# Patient Record
Sex: Female | Born: 1935
Health system: Southern US, Community
[De-identification: ages and names within clinical notes are randomized; demographics above are authoritative.]

## PROBLEM LIST (undated history)

## (undated) DIAGNOSIS — J45909 Unspecified asthma, uncomplicated: Secondary | ICD-10-CM

## (undated) DIAGNOSIS — I4891 Unspecified atrial fibrillation: Secondary | ICD-10-CM

## (undated) DIAGNOSIS — M1990 Unspecified osteoarthritis, unspecified site: Secondary | ICD-10-CM

## (undated) DIAGNOSIS — M19011 Primary osteoarthritis, right shoulder: Secondary | ICD-10-CM

## (undated) DIAGNOSIS — M19012 Primary osteoarthritis, left shoulder: Secondary | ICD-10-CM

## (undated) DIAGNOSIS — E039 Hypothyroidism, unspecified: Secondary | ICD-10-CM

## (undated) DIAGNOSIS — Z7901 Long term (current) use of anticoagulants: Secondary | ICD-10-CM

## (undated) DIAGNOSIS — I739 Peripheral vascular disease, unspecified: Secondary | ICD-10-CM

## (undated) DIAGNOSIS — J302 Other seasonal allergic rhinitis: Secondary | ICD-10-CM

## (undated) DIAGNOSIS — R51 Headache: Secondary | ICD-10-CM

## (undated) DIAGNOSIS — K5792 Diverticulitis of intestine, part unspecified, without perforation or abscess without bleeding: Secondary | ICD-10-CM

## (undated) DIAGNOSIS — R413 Other amnesia: Secondary | ICD-10-CM

## (undated) DIAGNOSIS — I499 Cardiac arrhythmia, unspecified: Secondary | ICD-10-CM

## (undated) DIAGNOSIS — R011 Cardiac murmur, unspecified: Secondary | ICD-10-CM

## (undated) DIAGNOSIS — F419 Anxiety disorder, unspecified: Secondary | ICD-10-CM

## (undated) DIAGNOSIS — I779 Disorder of arteries and arterioles, unspecified: Secondary | ICD-10-CM

## (undated) HISTORY — PX: BREAST EXCISIONAL BIOPSY: SUR124

## (undated) HISTORY — PX: COLONOSCOPY: SHX174

## (undated) HISTORY — PX: CAROTID ENDARTERECTOMY: SUR193

## (undated) HISTORY — PX: COLON SURGERY: SHX602

## (undated) HISTORY — DX: Diverticulitis of intestine, part unspecified, without perforation or abscess without bleeding: K57.92

## (undated) HISTORY — DX: Long term (current) use of anticoagulants: Z79.01

## (undated) HISTORY — DX: Peripheral vascular disease, unspecified: I73.9

## (undated) HISTORY — PX: TONSILLECTOMY: SUR1361

## (undated) HISTORY — DX: Disorder of arteries and arterioles, unspecified: I77.9

## (undated) HISTORY — PX: CATARACT EXTRACTION W/ INTRAOCULAR LENS  IMPLANT, BILATERAL: SHX1307

## (undated) HISTORY — PX: EYE SURGERY: SHX253

## (undated) HISTORY — DX: Unspecified atrial fibrillation: I48.91

---

## 1998-02-10 ENCOUNTER — Ambulatory Visit (HOSPITAL_COMMUNITY): Admission: RE | Admit: 1998-02-10 | Discharge: 1998-02-10 | Payer: Self-pay | Admitting: Obstetrics and Gynecology

## 1999-02-13 ENCOUNTER — Ambulatory Visit (HOSPITAL_COMMUNITY): Admission: RE | Admit: 1999-02-13 | Discharge: 1999-02-13 | Payer: Self-pay | Admitting: *Deleted

## 1999-02-13 ENCOUNTER — Other Ambulatory Visit: Admission: RE | Admit: 1999-02-13 | Discharge: 1999-02-13 | Payer: Self-pay | Admitting: *Deleted

## 2000-05-09 ENCOUNTER — Other Ambulatory Visit: Admission: RE | Admit: 2000-05-09 | Discharge: 2000-05-09 | Payer: Self-pay | Admitting: Internal Medicine

## 2002-07-28 ENCOUNTER — Other Ambulatory Visit: Admission: RE | Admit: 2002-07-28 | Discharge: 2002-07-28 | Payer: Self-pay | Admitting: Obstetrics and Gynecology

## 2002-09-03 ENCOUNTER — Emergency Department (HOSPITAL_COMMUNITY): Admission: EM | Admit: 2002-09-03 | Discharge: 2002-09-03 | Payer: Self-pay | Admitting: *Deleted

## 2002-09-29 ENCOUNTER — Ambulatory Visit (HOSPITAL_COMMUNITY): Admission: RE | Admit: 2002-09-29 | Discharge: 2002-09-29 | Payer: Self-pay | Admitting: Internal Medicine

## 2002-11-30 ENCOUNTER — Ambulatory Visit (HOSPITAL_COMMUNITY): Admission: RE | Admit: 2002-11-30 | Discharge: 2002-11-30 | Payer: Self-pay | Admitting: Gastroenterology

## 2003-03-12 HISTORY — PX: COLON SURGERY: SHX602

## 2003-04-20 ENCOUNTER — Ambulatory Visit (HOSPITAL_COMMUNITY): Admission: RE | Admit: 2003-04-20 | Discharge: 2003-04-20 | Payer: Self-pay | Admitting: Urology

## 2003-11-25 ENCOUNTER — Encounter (INDEPENDENT_AMBULATORY_CARE_PROVIDER_SITE_OTHER): Payer: Self-pay | Admitting: Specialist

## 2003-11-25 ENCOUNTER — Inpatient Hospital Stay (HOSPITAL_COMMUNITY): Admission: EM | Admit: 2003-11-25 | Discharge: 2003-12-03 | Payer: Self-pay | Admitting: Emergency Medicine

## 2004-03-07 ENCOUNTER — Inpatient Hospital Stay (HOSPITAL_COMMUNITY): Admission: RE | Admit: 2004-03-07 | Discharge: 2004-03-09 | Payer: Self-pay | Admitting: Vascular Surgery

## 2004-03-07 ENCOUNTER — Encounter (INDEPENDENT_AMBULATORY_CARE_PROVIDER_SITE_OTHER): Payer: Self-pay | Admitting: Specialist

## 2004-03-11 HISTORY — PX: GASTRIC RESTRICTION SURGERY: SHX653

## 2004-04-11 ENCOUNTER — Encounter: Admission: RE | Admit: 2004-04-11 | Discharge: 2004-04-11 | Payer: Self-pay | Admitting: Surgery

## 2004-05-17 ENCOUNTER — Inpatient Hospital Stay (HOSPITAL_COMMUNITY): Admission: RE | Admit: 2004-05-17 | Discharge: 2004-05-23 | Payer: Self-pay | Admitting: Surgery

## 2004-05-17 ENCOUNTER — Encounter (INDEPENDENT_AMBULATORY_CARE_PROVIDER_SITE_OTHER): Payer: Self-pay | Admitting: *Deleted

## 2004-08-14 ENCOUNTER — Other Ambulatory Visit: Admission: RE | Admit: 2004-08-14 | Discharge: 2004-08-14 | Payer: Self-pay | Admitting: Obstetrics and Gynecology

## 2004-10-11 ENCOUNTER — Encounter: Admission: RE | Admit: 2004-10-11 | Discharge: 2004-10-11 | Payer: Self-pay | Admitting: Internal Medicine

## 2005-11-15 ENCOUNTER — Encounter: Admission: RE | Admit: 2005-11-15 | Discharge: 2005-11-15 | Payer: Self-pay | Admitting: Internal Medicine

## 2006-08-01 ENCOUNTER — Encounter: Admission: RE | Admit: 2006-08-01 | Discharge: 2006-08-01 | Payer: Self-pay | Admitting: Internal Medicine

## 2006-11-19 ENCOUNTER — Encounter: Admission: RE | Admit: 2006-11-19 | Discharge: 2006-11-19 | Payer: Self-pay | Admitting: Internal Medicine

## 2008-01-05 ENCOUNTER — Encounter: Admission: RE | Admit: 2008-01-05 | Discharge: 2008-01-05 | Payer: Self-pay | Admitting: Internal Medicine

## 2008-12-11 ENCOUNTER — Emergency Department (HOSPITAL_COMMUNITY): Admission: EM | Admit: 2008-12-11 | Discharge: 2008-12-11 | Payer: Self-pay | Admitting: Emergency Medicine

## 2009-01-09 ENCOUNTER — Encounter: Admission: RE | Admit: 2009-01-09 | Discharge: 2009-01-09 | Payer: Self-pay | Admitting: Internal Medicine

## 2010-01-10 ENCOUNTER — Encounter: Admission: RE | Admit: 2010-01-10 | Discharge: 2010-01-10 | Payer: Self-pay | Admitting: Internal Medicine

## 2010-04-01 ENCOUNTER — Encounter: Payer: Self-pay | Admitting: Internal Medicine

## 2010-06-14 LAB — COMPREHENSIVE METABOLIC PANEL
ALT: 27 U/L (ref 0–35)
Albumin: 4.1 g/dL (ref 3.5–5.2)
Alkaline Phosphatase: 83 U/L (ref 39–117)
BUN: 13 mg/dL (ref 6–23)
CO2: 27 mEq/L (ref 19–32)
Calcium: 9.4 mg/dL (ref 8.4–10.5)
GFR calc non Af Amer: 52 mL/min — ABNORMAL LOW (ref >60)
Sodium: 138 mEq/L (ref 135–145)
Total Bilirubin: 1.1 mg/dL (ref 0.3–1.2)
Total Protein: 7.4 g/dL (ref 6.0–8.3)

## 2010-06-14 LAB — POCT CARDIAC MARKERS
CKMB, poc: 1.2 ng/mL (ref 1.0–8.0)
Myoglobin, poc: 99.2 ng/mL (ref 12–200)
Troponin i, poc: <0.05 ng/mL (ref 0.00–0.09)

## 2010-06-14 LAB — DIFFERENTIAL
Basophils Absolute: 0 10*3/uL (ref 0.0–0.1)
Basophils Relative: 0 % (ref 0–1)
Eosinophils Absolute: 0.1 10*3/uL (ref 0.0–0.7)
Lymphs Abs: 1.5 10*3/uL (ref 0.7–4.0)
Monocytes Relative: 12 % (ref 3–12)

## 2010-06-14 LAB — URINALYSIS, ROUTINE W REFLEX MICROSCOPIC
Bilirubin Urine: NEGATIVE
Leukocytes, UA: NEGATIVE
Nitrite: NEGATIVE

## 2010-06-14 LAB — TSH: TSH: 0.296 u[IU]/mL — ABNORMAL LOW (ref 0.350–4.500)

## 2010-06-14 LAB — CBC
HCT: 43.4 % (ref 36.0–46.0)
Hemoglobin: 14.8 g/dL (ref 12.0–15.0)
MCHC: 34.1 g/dL (ref 30.0–36.0)
Platelets: 167 10*3/uL (ref 150–400)
RDW: 13.2 % (ref 11.5–15.5)

## 2010-07-27 NOTE — Op Note (Signed)
[name], [name]                ACCOUNT NO.:  [id number]   MEDICAL RECORD NO.:  [id number]          PATIENT TYPE:  INP   LOCATION:  2550                         FACILITY:  [facility]   PHYSICIAN:  Sandria Bales. Ezzard Standing, M.D.  DATE OF BIRTH:  June 26, 1932   DATE OF PROCEDURE:  11/25/2003  DATE OF DISCHARGE:                                 OPERATIVE REPORT   PREOPERATIVE DIAGNOSIS:  Perforated sigmoid diverticulitis.   POSTOPERATIVE DIAGNOSIS:  Perforated sigmoid diverticulitis.   OPERATION PERFORMED:  Open sigmoid colectomy with end colostomy.   SURGEON:  Sandria Bales. Ezzard Standing, M.D.   ASSISTANT:  Abigail Miyamoto, M.D.   ANESTHESIA:  General endotracheal.   ESTIMATED BLOOD LOSS:  100 mL.   DRAINS:  None.   INDICATIONS FOR PROCEDURE:  [name] is a 77 year old white female who had  sudden onset of abdominal pain last evening, had a CT scan this morning  which showed an inflammatory area of the sigmoid colon with free air in the  peritoneal cavity.  She has presented with signs and symptoms of  peritonitis.  Discussion was carried out with her about proceeding to the  operating room for abdominal exploration and probable sigmoid colectomy.  The indications and potential complications were explained to the patient.  The potential complications include but not limited to bleeding, infection,  the need for colostomy and that this will probably be a reversible  colostomy.   DESCRIPTION OF PROCEDURE:  The patient was taken to the operating room and  given a general tracheal anesthetic.  She actually at home was on Avelox and  Biaxin so this made her not really a candidate for a [place] study.  She  was given Unasyn preoperatively.  The abdomen was prepped with Betadine  solution.  She had a Foley catheter in place, an orogastric tube in place  because of significant sinus trouble she has been having.  The abdomen was  prepped with Betadine solution and sterilely draped.  I made a midline  incision and went to the abdominal cavity.  Abdominal exploration revealed  the right and left lobes of the liver palpated normal.  What I could feel of  the gallbladder was normal.  The small bowel was otherwise, unremarkable  except for inflammatory exudate and pus emanating from the left lower  quadrant.  She had a perforation of the proximal one third of her sigmoid  colon.  I resected an about 17 to 18 cm length of sigmoid colon with the  perforation involved.  I divided the colon using a GIA stapler, the 75 load.  I marked the distal end of the two Prolene sutures.  I did identify the left  ureter which was well posterior to the dissection.  Because this was the  proximal sigmoid colon, I did have to mobilize a lot of the left colon to  actually get the colostomy in the left upper quadrant.  When I go back to  reverse her, I will need to take down her splenic flexure to get this down  to the pelvis.   Consideration will be done  for doing at least that part of the operation  laparoscopically.  The proximal end of the bowel again brought up through  the ostomy wound in the left upper quadrant, dividing the rectus abdominis  muscles. I went through the anterior and posterior fascia and developed the  colostomy using 3-0 Vicryl sutures.  I then irrigated the abdomen with 5  liters of saline, closed the fascia with two running #1 PDS sutures, closed  the skin with a skin gun.  I then irrigated thoroughly.  I know she is at  risk for wound infection.  Her colostomy then matured with interrupted 3-0  Vicryl sutures.  I sterilely dressed the wound.  Dr. Katrinka Blazing put in a central  line because she have some intravenous access trouble during the surgery.  The patient was then transferred to the recovery room in good condition.  The sponge and needle counts were correct at the end of this case.      Pervis Hocking   DHN/MEDQ  D:  11/25/2003  T:  11/28/2003  Job:  [id number]   cc:   Larina Earthly, M.D.  [address]  [place]  [place] [address]  Fax: [phone]   Tera Mater. Evlyn Kanner, M.D.  [address]  [place]  [place] [address]  Fax: [phone]   Llana Aliment. Malon Kindle., M.D.  [address]  [place]  [place] [address]  Fax: [phone]   Claudette Laws, M.D.  [address]  [place]  [place] [address]  Fax: [phone]   Jefry H. Pollyann Kennedy, MD  [phone] [place]  [place] [address]  Fax: [phone]

## 2010-07-27 NOTE — Consult Note (Signed)
Danielle [name], Danielle [name]                ACCOUNT NO.:  [id number]   MEDICAL RECORD NO.:  [id number]          PATIENT TYPE:  INP   LOCATION:  5742                         FACILITY:  [facility]   PHYSICIAN:  Jefry H. Pollyann Kennedy, M.D.   DATE OF BIRTH:  08-20-1932   DATE OF CONSULTATION:  11/29/2003  DATE OF DISCHARGE:                                   CONSULTATION   REASON FOR CONSULTATION:  Headache and possible sinus disease.   HISTORY OF PRESENT ILLNESS:  This is a 77 year old lady who had a several-  week history of right-sided hemifacial pain and pressure sensation.  She  denied any nasal symptoms.  She denied any hearing loss or any trouble with  chewing or opening her mouth.  She denies any dental pain or dental disease.  She is a patient of Dr. Elliston Callas for chronic allergic disease that has been  reasonably well controlled.  She underwent a CT of the sinuses in the past  couple of weeks that reportedly revealed a maxillary retention cyst.  She  was scheduled to see me in the office last week but then developed  diverticulitis and was admitted to the hospital for surgical intervention.  Since she has been in the hospital, her headaches have actually improved  dramatically.  She is currently on antibiotics and analgesics.   PAST MEDICAL/SURGICAL HISTORY:  Otherwise unremarkable.   PHYSICAL EXAMINATION:  GENERAL:  She is a pleasant, healthy-appearing,  elderly lady.  HEENT:  The oral cavity and pharynx are clear.  Nasal exam is unremarkable.  There are no signs of polyps or exudate.  The ears are normal to examination  without any evidence of external otitis, air fluid, or disease.  NECK:  There are no palpable neck masses, although there is an internal  jugular vein central line placement on the right.   IMPRESSION:  Right hemifacial pain, probably not of sinus origin.  I will  review the CT scans tomorrow.  The husband has been at home and is going to  bring them in tomorrow.  If any additional  recommendations need to be made,  I will make them at that time.       JHR/MEDQ  D:  11/29/2003  T:  11/30/2003  Job:  [id number]   cc:   Sandria Bales. Ezzard Standing, M.D.  [address]  [place]  [place] [address]  Fax: [phone]   Sidney Ace, M.D. [facility]

## 2010-07-27 NOTE — H&P (Signed)
NAME:  Danielle [name], Danielle [name]                          ACCOUNT NO.:  [id number]   MEDICAL RECORD NO.:  [id number]                   PATIENT TYPE:  INP   LOCATION:  2550                                 FACILITY:  [facility]   PHYSICIAN:  Sandria Bales. Ezzard Standing, M.D.               DATE OF BIRTH:  08-Jul-1932   DATE OF ADMISSION:  11/25/2003  DATE OF DISCHARGE:                                HISTORY & PHYSICAL   HISTORY OF PRESENT ILLNESS:  This is a 77 year old white female who has had  some trouble with constipation.  Actually, she was given a new laxative in  the last week or two by Dr. Felipa Eth.  She has had at least two colonoscopies  within the last five years, the most recent about a year ago by Dr. Vilinda Boehringer, who has no obvious colonic mass or polyp.  She apparently has had  at least one bout of diverticulitis going back many years, probably 8-10  years ago.  She has never been hospitalized for any other colonic problems.  She denies history of peptic ulcer disease, liver disease, gallbladder  disease, and she has had no prior abdominal surgery.  Last evening, she  developed abdominal pain which got progressively worse and localized to her  left lower quadrant suprapubic area.  This morning, she went to see Dr. Felipa Eth  and had a CT scan.  The CT scan, read by radiology, revealed inflammatory  mass of the sigmoid colon with free air consistent with perforated  diverticular disease.   ALLERGIES:  She denies any allergies.   CURRENT MEDICATIONS:  Avelox 400 mg daily, Singular 10 mg q.h.s., Tramadol  HCL 50 mg q.6h., Zyrtec 10 mg daily, Biaxin 1000 mg daily, prednisone  dosepak she has taken about 2/3 of, Nasacort, Advair, Mucinex, Synthroid,  she thinks 0.0125 mg, Fosamax.   REVIEW OF SYMPTOMS:  NEUROLOGIC:  No history of seizures or loss of  consciousness.  PULMONARY:  No history of pneumonia or tuberculosis.  She  has had a lot of sinus and headache problem.  She underwent a CT scan at  [facility]  dated November 21, 2003, read by Dagoberto Reef, which showed  soft tissue nodularity of the right maxillary sinus, most likely mucous  retention cyst.  She was scheduled to see Dr. Pollyann Kennedy this morning before this  sudden abdominal pain came on.  CARDIAC:  No heart disease or chest pain.  GASTROINTESTINAL:  See history of present illness.  UROLOGIC:  She has seen  Dr. Mickel Crow, has had some recurrent bladder spasm/pain, I think she is on  Tramadol by Dr. Etta Grandchild for this pain or discomfort.  GYNECOLOGIC:  She has  two daughters and has had no vaginal bleeding.   SOCIAL HISTORY:  She worked for the [employer] for a number of  years but is retired now.   PHYSICAL EXAMINATION:  VITAL SIGNS:  Temperature 99.2, blood pressure 132/84, pulse 64,  respirations 18.  GENERAL:  She is a well nourished white female, alert and cooperative on  exam, but clearly in discomfort of her abdomen.  HEENT:  Unremarkable.  NECK:  Supple, no masses, no thyromegaly.  LUNGS:  Clear to auscultation.  BREASTS:  Symmetric.  She has had a prior breast biopsy by Dr. Terri Piedra many,  many years ago, no recent breast trouble.  ABDOMEN:  She has some decreased but barely present bowel sounds, she is  tender with guarding in her suprapubic and left lower quadrant area.  She  has no abdominal scars, no abdominal masses.  RECTAL:  I did not do a rectal exam on her.  EXTREMITIES:  She has good strength in the upper and lower extremities.  NEUROLOGICAL:  She is grossly intact to motor and sensory function.   LABORATORY DATA:  White blood count 15,200, hemoglobin 15, hematocrit 44,  platelet count 194,000.  Sodium 135, potassium 3.6, chloride 104, CO2 21,  glucose 123, BUN 19.  Her urinalysis was negative.  Protime 13.2.   IMPRESSION:  1.  Perforated sigmoid diverticulitis.  The patient clearly has a moderate      amount of free air with local inflammatory mass, has worsening symptoms.      I  do not think she  will be a candidate for IV antibiotics and      observation.  I talked to her and her husband about going ahead with      surgery, that most likely she would need a colostomy, this would be      temporary, 3-6 months.  I also talked about the possibility of      malignancy and the possibility of wound problems and infection.  I think      she understands all this pretty well and now will be taken to the      operating room for abdominal exploration.  2.  Sinus headaches and apparent mucous retention cyst, particularly of her      right maxillary sinus, with plans to see Dr. Pollyann Kennedy.  If she has problems      with this, we can contact him in the hospital, otherwise, we will make      arrangements post discharge.  3.  Hypothyroid on medicine.  4.  Bladder complaints followed by Dr. Javier Docker. Ezzard Standing, M.D.    DHN/MEDQ  D:  11/25/2003  T:  11/25/2003  Job:  [id number]   cc:   Larina Earthly, M.D.  [address]  [place]  [place] [address]  Fax: [phone]   Tera Mater. Evlyn Kanner, M.D.  [address]  [place]  [place] [address]  Fax: [phone]   Llana Aliment. Malon Kindle., M.D.  [address]  [place]  [place] [address]  Fax: [phone]   Jeannett Senior. Pollyann Kennedy, M.D.  [address] [place]  [place] [address]  Fax: [phone]   Claudette Laws, M.D.  [address]  [place]  [place] [address]  Fax: [phone]

## 2010-07-27 NOTE — Op Note (Signed)
Danielle [name], [place] NO.:  [id number]   MEDICAL RECORD NO.:  [id number]          PATIENT TYPE:  OIB   LOCATION:  2899                         FACILITY:  [facility]   PHYSICIAN:  Janetta Hora. Fields, MD  DATE OF BIRTH:  Jun 02, 1932   DATE OF PROCEDURE:  03/07/2004  DATE OF DISCHARGE:                                 OPERATIVE REPORT   PREOPERATIVE DIAGNOSIS:  Right symptomatic internal carotid stenosis.   POSTOPERATIVE DIAGNOSIS:  Right symptomatic internal carotid artery  stenosis.   PROCEDURE:  Right carotid endarterectomy.   SURGEON:  Janetta Hora. Fields, M.D.   ASSISTANT:  Rowe Clack, P.A.-C.   ANESTHESIA:  General.   INDICATIONS:  The patient is a 77 year old female with a severe, right  carotid artery stenosis by duplex ultrasound. She has had a recent TIA  manifesting with left arm weakness.   OPERATIVE FINDINGS:  1.  An 80% right internal carotid artery stenosis with disease primarily      limited to the internal carotid artery and minimal common and external      carotid artery disease.  2.  A 10 French shunt.  3.  Dacron patch.   DESCRIPTION OF PROCEDURE:  After obtaining informed consent, the patient was  taken to the operating room, patient placed in the supine position on the  operating table.  After induction of general anesthesia and endotracheal  intubation, a Foley catheter was placed.  Next, the patient's entire right  neck and chest were prepped and draped in the usual sterile fashion.  Next,  an oblique incision was made along the anterior border of the right  sternocleidomastoid muscle.  This incision was carried down to the level of  the jugular vein. The common facial vein was dissected free  circumferentially and ligated and divided between silk ties.  The jugular  vein was then retracted laterally.  The common carotid artery was dissected  free circumferentially.   The patient had a fairly high bifurcation.  Dissection proceded up  onto the  internal carotid artery and extensive mobilization of the hypoglossal nerve  was required to mobilize the internal carotid artery sufficiently to get to  a disease free area.  This required division of the occipital branches to  the sternocleidomastoid muscle and some retraction on the hypoglossal nerve.  The hypoglossal nerve was protected and the ansa cervicalis was divided.  The internal carotid artery was dissected free circumferentially at a  suitable area past the disease process. The external carotid, superior  thyroid arteries were also dissected free circumferentially.  External  superior thyroid arteries were controlled with a vessel loop.  Common  carotid artery was controlled with an umbilical tape. Vagus nerve was  identified and protected from harms' way.   Next, the patient was given 7000 units of intravenous heparin.  Distal  internal carotid artery was occluded with a fine bulldog clamp. External  carotid and superior thyroid arteries were controlled with vessel loops.  Common carotid artery was controlled with a peripheral DeBakey clamp.  Longitudinal arteriotomy was made just below the carotid bifurcation.  This  was extended with Potts scissors up through the level of severe stenosis in  the internal carotid artery which was approximately 80%.  There was friable  material and plaque in this area.   A 10 French shunt was then brought up in the operative field, and threaded  into the internal carotid artery and allowed to back-bleed. This was then  threaded down into the common carotid artery and secured with an umbilical  tape.  The shunt was inspected and opened to flow, to restore flow to the  brain in approximately 6 minutes.   Next, endarterectomy was begun in a suitable plane near the carotid  bifurcation.  There was minimal disease in the common and external carotid  arteries, and the plaque was quite thin and friable in this area.  All this  debris  was completely removed.  Endartectomy was extended up into the  internal carotid artery and a tight stenosis with friable plaque was removed  completely.  All loose debris was completely removed from the  endarterectomies surface.  A Dacron patch was then brought up in the  operative field and sewn on as a patch arthroplasty.  Just prior to  completion of the anastomosis, the artery was thoroughly flushed with  heparinized saline. The shunt was then clamped.  The shunt was then removed  from the distal internal carotid and allowed to back-bleed.  Shunt was then  removed from the proximal common carotid artery and then reclamped  proximally and distally, that is prior to placing the shunt.  Artery was  then thoroughly irrigated with heparinized saline.  Anastomosis was then  secured.   Flow was first restored from the common carotid artery and the external  carotid artery for 5 cardiac cycles.  Flow was then restored to the right  internal carotid artery.  The artery was inspected with Doppler and found to  have good biphasic and triphasic flow through the artery.  Hemostasis was  obtained with thrombin and Gelfoam.  Platysma muscle was reapproximated with  running 3-0 Vicryl suture.  Skin was closed with 4-0 Vicryl subcuticular  stitch.  The patient tolerated the procedure well, there were no  complications.  Sponge, instrument and needle counts were correct at the end  of the case. The patient was taken to the recovery room in stable condition  with grossly neurovascularly intact upper extremities and lower extremities  symmetric motor strength.       CEF/MEDQ  D:  03/07/2004  T:  03/07/2004  Job:  [id number]

## 2010-07-27 NOTE — Discharge Summary (Signed)
NAMEBRIONNA, ROMANEK NO.:  [id number]   MEDICAL RECORD NO.:  [id number]          PATIENT TYPE:  INP   LOCATION:  5734                         FACILITY:  [facility]   PHYSICIAN:  Sandria Bales. Ezzard Standing, M.D.  DATE OF BIRTH:  04-12-1932   DATE OF ADMISSION:  05/17/2004  DATE OF DISCHARGE:  05/23/2004                                 DISCHARGE SUMMARY   DISCHARGE DIAGNOSES:  1.  Status post perforated diverticulitis with end-colostomy, closure of      colostomy on this admission.  2.  Recent carotid endarterectomy by Janetta Hora. Fields, M.D.  3.  Hypothyroid.  4.  History of bladder infections followed by Claudette Laws, M.D.   PROCEDURE:  The patient had a laparoscopic assisted colostomy closure on  May 17, 2004.   HISTORY OF PRESENT ILLNESS:  Ms. Cohrs is a 77 year old white female who in  September of 2005 presented with a perforated diverticulitis.  She underwent  an end left colon colostomy and Hartman's pouch.   She comes into this hospitalization for reversal of her colostomy.  Preoperatively, she has undergone a barium enema documenting both the  location of her colon and to make sure there is no residual mass.   She completed an antibiotic and mechanical bowel prep before presenting to  the hospital.   HOSPITAL COURSE:  On the day of admission, she underwent a laparoscopic  assisted colostomy closure.   She had an NG tube for 3 days.  Her NG tube was then removed.  She started  on a liquid diet and advanced to a regular diet.   She will be 6 days postoperative tomorrow.  She is afebrile.  She is  tolerating a regular diet.  She has had a bowel movement.  Her wound looks  okay.   She will be ready for discharge.   DISCHARGE MEDICATIONS:  1.  Vicodin for pain.  2.  Zyrtec 10 mg daily.  3.  Singulair 10 mg daily.  4.  Advair Diskus daily.  5.  Nasacort daily.  6.  Trimethoprim 100 mg daily.  7.  Synthroid 125 mcg daily.  8.  Fosamax 70 mg weekly.  9.   MiraLax powder daily.  10. Mucinex daily.  11. Caltrate.   PATHOLOGY:  Removal of segment of sigmoid colon with otherwise benign colon.   CONDITION ON DISCHARGE:  Good.   FOLLOW UP:  She will return to see me in 2 weeks for follow-up.      DHN/MEDQ  D:  05/22/2004  T:  05/23/2004  Job:  [id number]   cc:   Larina Earthly, M.D.  [address]  [place]  [place] [address]  Fax: [phone]   Claudette Laws, M.D.  [address]  [place]  [place] [address]  Fax: [phone]   Janetta Hora. Fields, MD  [address], [place] [address]

## 2010-07-27 NOTE — Op Note (Signed)
   NAME:  Danielle [name], Danielle [name]                          ACCOUNT NO.:  [id number]   MEDICAL RECORD NO.:  [id number]                   PATIENT TYPE:  AMB   LOCATION:  ENDO                                 FACILITY:  [facility]   PHYSICIAN:  James L. Malon Kindle., M.D.          DATE OF BIRTH:  12/27/1932   DATE OF PROCEDURE:  11/30/2002  DATE OF DISCHARGE:                                 OPERATIVE REPORT   PROCEDURE:  Colonoscopy.   MEDICATIONS:  Fentanyl 75 mcg, Versed 6 mg IV.   INDICATIONS:  Colon cancer screening.   DESCRIPTION OF PROCEDURE:  The procedure had been explained to the patient  and consent obtained.   With the patient in the left lateral decubitus position, the Olympus  pediatric adjustable scope was inserted and advanced.  The prep was  excellent.  The patient had moderate diverticulosis in the sigmoid colon.  After we were able to pass this, we were able to advance rapidly to the  cecum.  The ileocecal valve and appendiceal orifice were seen.  We advanced  to the cecum.  The  scope was withdrawn and the cecum, ascending colon,  transverse colon, descending and sigmoid colon were seen well.  No polyps or  other lesions were seen.  Again extensive diverticulosis in the area of the  sigmoid colon.  No polyps in this area.  The scope was withdrawn. The  patient tolerated the procedure well.   ASSESSMENT:  1. No polyps.  2. Diverticulosis of the sigmoid colon.   PLAN:  Yearly hemoccults and diverticulosis sheet.  Possibly repeat  procedure in 10 years.                                                James L. Malon Kindle., M.D.    Waldron Session  D:  11/30/2002  T:  11/30/2002  Job:  [id number]   cc:   Larina Earthly, M.D.  [address]  [place]  [place] [address]  Fax: [phone]

## 2010-07-27 NOTE — Discharge Summary (Signed)
Danielle [name], SCHUSSLER NO.:  [id number]   MEDICAL RECORD NO.:  [id number]          PATIENT TYPE:  INP   LOCATION:                               FACILITY:  [facility]   PHYSICIAN:  Sandria Bales. Ezzard Standing, M.D.  DATE OF BIRTH:  [birth year]   DATE OF ADMISSION:  11/25/2003  DATE OF DISCHARGE:  12/03/2003                                 DISCHARGE SUMMARY   DISCHARGE DIAGNOSES:  1.  Perforated sigmoid colon diverticulitis.  2.  Sinus problems.  3.  Hypothyroidism on medicine.  4.  Complaints of her bladder.   OPERATIONS PERFORMED:  Patient underwent a sigmoid colectomy with end  colostomy on the 16th of September 2005.   HISTORY OF PRESENT ILLNESS:  This is a 77 year old white female who is a  patient of Dr. Larina Earthly.  She presented with increasing constipation.  She  had had at least one bout of diverticulitis prior to this admission.  She  had undergone at least two colonoscopies in the last five years by Dr. Vilinda Boehringer without any obvious mucosal lesions.   She developed progressive abdominal pain which was localized to the left  lower quadrant and suprapubic area.  She had a CT scan ordered by Dr. Felipa Eth  which revealed inflammatory mass of sigmoid colon with free air consistent  with a perforated diverticular disease.   CURRENT MEDICATIONS:  1.  Avelox 400 mg daily.  2.  Singulair 10 mg q.h.s.  3.  Tramadol/HCl 50 mg every six hours.  4.  Zyrtec 10 mg daily.  5.  Biaxin 1000 mg daily.  6.  She had been on a prednisone dose pack which she had taken approximately      2/3 of.  7.  She was on Nasacort.  8.  Advair.  9.  Mucinex.  10. Synthroid 0.125 mg.  11. Fosamax.   PAST MEDICAL HISTORY:  CT scan at [facility] of her sinuses which shows  some nodularity of her right maxillary sinus most likely felt to be a  retained mucous retention cyst.  She was actually scheduled to see Dr. Pollyann Kennedy  prior to this presentation.   PHYSICAL EXAMINATION:  VITAL SIGNS:   Temperature 99.2, blood pressure  132/84, pulse 64, respirations 18.  ABDOMEN:  She had tenderness and guarding in her suprapubic and left lower  quadrant.   Her white blood count was 15,000.   IMPRESSION:  She had a perforated diverticular disease that she would be  best served by proceeding with abdominal laparotomy and sigmoid colon  resection and probable colostomy.   Discussed this with patient.  Patient was taken to the operating room on the  day of admission where she underwent a sigmoid colectomy and end colostomy.   Postoperatively she did fairly well.  Because of her history of sinus  problems Dr. Pollyann Kennedy saw her while in the hospital and was going to arrange to  follow up for her sinus disease as an outpatient.   By the fifth postoperative day she was afebrile.  Her white blood count was  8100.  Her potassium was 4.3 and she was started on clear liquids and  advanced.   By the 24th of September she was ready for discharge.   DISCHARGE INSTRUCTIONS:  Regular food.  She was given Vicodin for pain.  She  is going to see me back in the office within two to three weeks for follow-  up.  She would resume her home medications and call for any interval  problems.   Her final pathology showed sigmoid colon with diverticulitis and abscess  formation, no evidence of malignancy.      Sandria Bales. Ezzard Standing, M.D.  Electronically Signed     DHN/MEDQ  D:  11/20/2004  T:  11/20/2004  Job:  [id number]   cc:   Larina Earthly, M.D.  [address]  [place]  [place] [address]  Fax: [phone]   Llana Aliment. Malon Kindle., M.D.  [address]  [place]  [place] [address]  Fax: [phone]   Jeannett Senior. Pollyann Kennedy, MD  [phone] [address] [place]  [place] [address]  Fax: [phone]

## 2010-07-27 NOTE — Op Note (Signed)
NAMEELLESE, Danielle [name] NO.:  [id number]   MEDICAL RECORD NO.:  [id number]          PATIENT TYPE:  INP   LOCATION:  2899                         FACILITY:  [facility]   PHYSICIAN:  Sandria Bales. Ezzard Standing, M.D.  DATE OF BIRTH:  1932/06/03   DATE OF PROCEDURE:  05/17/2004  DATE OF DISCHARGE:                                 OPERATIVE REPORT   PREOPERATIVE DIAGNOSIS:  Status post perforated proximal sigmoid colon with  colostomy and Hartmann's pouch.   POSTOPERATIVE DIAGNOSIS:  Status post perforated proximal sigmoid colon with  colostomy and Hartmann's pouch and moderate intra-abdominal adhesions.   OPERATION PERFORMED:  Laparoscopically assisted colostomy closure,  laparoscopically mobilized splenic flexure hand sewn anastomosis.   SURGEON:  Sandria Bales. Ezzard Standing, M.D.   ASSISTANT:  Adolph Pollack, M.D.   ANESTHESIA:  General endotracheal.   ESTIMATED BLOOD LOSS:  150 mL.   COMPLICATIONS:  None.   INDICATIONS FOR PROCEDURE:  Ms. Borak is a 77 year old white female who had a  perforated sigmoid diverticulitis in September of 2005.  This required a end  left colon colostomy and Hartmann's pouch.  She now comes for reversal of  this colostomy.  She has preoperatively undergone a mechanical antibiotic  bowel prep and presents to the operating room.   DESCRIPTION OF PROCEDURE:  The patient underwent a general endotracheal  anesthesia.  She had a nasogastric tube in place, Foley catheter in place,  PAS stockings in place.  She was given a gram of cefoxitin at initiation of  the procedure.  She had a colostomy of the left upper quadrant.  Her abdomen  was prepped with betadine solution and sterilely draped.  I sewed closed the  colostomy using 2-0 silk suture.  I then placed 4 trocars, one by Hasson cut  down infraumbilical, one a 10 mm right midabdomen, a 5 mm right subcostal,  and a 10 mm left lower quadrant.   I spent probably about an hour and a half first in lysing  adhesions, taking  down small bowel adhesions to the anterior abdominal wall and around the  colostomy.  Secondly I divided omental adhesions around the colostomy and  thirdly, I mobilized the left colon starting at about the distal left colon  where the colostomy took off from the lateral abdominal side wall up around  the splenic flexure to the mid transverse colon.  I thought I got good  mobilization of the splenic flexure and left colon.   At this point I opened the abdomen through a lower midline incision.   The patient had adhesions of small bowel in the pelvis, particularly in the  left lower quadrant, at least one small bowel pretty tightly adhesed to the  left tube and ovary.  These were all freed up.   The small bowel was then retracted out of the way.  I identified the distal  sigmoid stump which was maybe about 8 to 10 cm above the peritoneal  reflection.  I placed two silk sutures on the most lateral side walls of  this, identified the old Prolene sutures.  I  then brought down the colostomy  which I had cut out of the anterior abdominal wall.  This seemed to lie  without any tension down to the edge of the pelvis.   I cut back about 2 inches or an inch on the distal sigmoid colon.  I cut  about an inch out of the colostomy to allow two fresh ends which met under  no tension.   I then did an interrupted 2-0 silk inverting silk suture and then I used  Gambee in the anterior colon.   This then allowed at least a fingerbreadth anastomosis.  I buttressed this  with 2-0 silk sutures.  I then attached the mesentery down over the pelvic  rim but did not try to close the entire mesenteric defect because the small  bowel lay anterior to the colon.   I then irrigated the abdomen out with 4 L of saline, both the pelvis and the  abdomen.  I made sure the NG tube was in good position.  I made sure the  small bowel lay in the right position.  I placed the omentum in the anterior   portion of the abdominal cavity.  I first closed the colostomy in two layers  with interrupted #1 PDS suture.  I closed the lower midline incision with  two running #1 PDS sutures which met in the middle.   The skin was then closed with skin staples both the midline wound and the  colostomy wound and three other trocar sites.  Sponge and needle count were  correct at the end of the case.  The patient tolerated the procedure well.  The estimated blood loss was about 150 mL.   DRAINS:  None.      DHN/MEDQ  D:  05/17/2004  T:  05/17/2004  Job:  [id number]   cc:   Larina Earthly, M.D.  [address]  [place]  [place] [address]  Fax: [phone]   Janetta Hora. Fields, MD  [address], [place] [address]   Claudette Laws, M.D.  [address]  [place]  [place] [address]  Fax: [phone]

## 2010-07-27 NOTE — Discharge Summary (Signed)
Danielle [name], Danielle [name]                ACCOUNT NO.:  [id number]   MEDICAL RECORD NO.:  [id number]          PATIENT TYPE:  OIB   LOCATION:  3307                         FACILITY:  [facility]   PHYSICIAN:  Janetta Hora. Fields, MD  DATE OF BIRTH:  1932-12-11   DATE OF ADMISSION:  03/07/2004  DATE OF DISCHARGE:  03/09/2004                                 DISCHARGE SUMMARY   HISTORY OF PRESENT ILLNESS:  This is a 77 year old female referred by Dr.  Felipa Eth to Dr. Arbie Cookey for evaluation of carotid artery disease.  The patient had  an episode approximately two weeks ago where she developed left arm and  facial numbness that lasted approximately 20 to 30 minutes at which time she  had poor control of her arm.  She has since returned to her baseline.  She  occasionally does have some numbness and tingling in the face but no other  symptoms.  A Duplex carotid study revealed severe right internal carotid  artery stenosis in the 80 to 99% range with no significant disease on the  left side.  The patient had occasional headaches.  She denies nausea,  vomiting, vertigo, dizziness, history of falls, seizures, muscle weakness,  speech impairment, dysphagia, visual changes, syncope, presyncope or  confusion.  She does admit to some memory loss that may be worsening in the  recent time. She was felt to be a candidate for right carotid endarterectomy  so as to lower her risk of cerebrovascular accident.  She was admitted this  hospitalization for the procedure.   PAST MEDICAL HISTORY:  1.  Carotid artery disease as described.  2.  Hypothyroidism.  3.  History of occasional anxiety.  4.  Mild asthma.  5.  Diverticulitis.   PAST SURGICAL HISTORY:  1.  Colectomy on November 25, 2003, by Dr. Ezzard Standing emergently.  She is      scheduled for possible reversal of colostomy in the near future.  2.  Tonsillectomy in 1955.   MEDICATIONS ON ADMISSION:  1.  She recently finished a Z pack for upper respiratory symptoms.  2.   Aspirin 325 mg daily.  3.  Xanax 0.25 mg p.r.n.  4.  Zyrtec 10 mg daily.  5.  Singulair 10 mg daily.  6.  Mucinex 600 mg daily.  7.  Synthroid 125 mcg daily.  8.  Nasacort p.r.n.  9.  Advair 250/50 one daily.  10. Fosamax 70 mg q. week.  11. Multivitamin daily.  12. Caltrate 600+ b.i.d.  13. Trimethoprim 100 mg daily.  14. MiraLax daily.   For family history, social history, review of systems and physical  examination please see the history and physical done at the time of  admission.   HOSPITAL COURSE:  The patient was admitted electively and on March 07, 2004, taken to the operating room where she underwent the following  procedure:  Right carotid endarterectomy with Dacron patch angioplasty.  The  patient tolerated the procedure well and was taken to the post anesthesia  care unit in stable condition.   POSTOPERATIVE HOSPITAL COURSE:  Patient has done well.  She has remained  hemodynamically stable, however, did require dopamine for blood pressure  support for a short term.  This has been weaned off.  Her neurological  status is stable with no new changes or deficits.  She has advanced in a  routine manner in regard to activity.  She has had some mild dizziness but  this has improved.  She also had mild headache and this has also improved.  Her incision is healing well without evidence of infection.  She is  tolerating diet without significant difficulty.  Her overall status is felt  to be tentatively stable for discharge on the morning of March 09, 2004,  pending morning round reevaluation.   CONDITION ON DISCHARGE:  Stable and improved.   DISCHARGE MEDICATIONS:  As preoperatively.  Additionally for pain, Tylox one  or two every four to six hours as needed.   DISCHARGE INSTRUCTIONS:  The patient received written instructions in regard  to medications, activity, diet, wound care and follow-up.   Follow-up will include Dr. Darrick Penna on Friday, March 24, 2003, at  1:45 p.m.   FINAL DIAGNOSIS:  Symptomatic right internal carotid artery stenosis now  status post elective carotid endarterectomy.   OTHER DIAGNOSES:  As per history.       WEG/MEDQ  D:  03/08/2004  T:  03/08/2004  Job:  [id number]   cc:   Larina Earthly, M.D.  [address]  [place]  [place] [address]  Fax: [phone]

## 2010-07-27 NOTE — H&P (Signed)
NAMEDALICIA, KISNER                ACCOUNT NO.:  [id number]   MEDICAL RECORD NO.:  [id number]          PATIENT TYPE:  OIB   LOCATION:  NA                           FACILITY:  [facility]   PHYSICIAN:  Janetta Hora. Fields, MD  DATE OF BIRTH:  Dec 21, 1932   DATE OF ADMISSION:  03/07/2004  DATE OF DISCHARGE:                                HISTORY & PHYSICAL   CHIEF COMPLAINT:  Episode of left arm numbness approximately 2 weeks ago.   HISTORY OF PRESENT ILLNESS:  This is a 77 year old female referred by Dr.  Felipa Eth to Dr. Tawanna Cooler Early for evaluation of carotid artery disease.  The  patient has an episode approximately 2 weeks ago where she developed left  arm and face numbness that lasted approximately 20-30 minutes, at which time  she also had poor control of her arm.  She has since returned to her  baseline.  She also occasionally has some numbness and tingling in the face,  but no other symptoms.  A duplex carotid study revealed severe right  internal carotid artery stenosis in the 80% to 99% range with no significant  disease on the left side.  The patient has occasional headaches.  She denies  nausea, vomiting, vertigo, dizziness, history of falls, seizures, muscle  weakness, speech impairment, dysphagia, visual changes, syncope, presyncope,  or confusion.  She does admit to some occasional memory loss that may be  worsening in the recent time period.  She is felt to be a candidate for  right carotid endarterectomy so as to lower her risk of cerebrovascular  accident.  She will be admitted this hospitalization for the procedure.   PAST MEDICAL HISTORY:  1.  Coronary artery disease as described.  2.  Hypothyroidism.  3.  History of occasional anxiety.  4.  Mild asthma.  5.  Diverticulitis.   PAST SURGICAL HISTORY:  1.  A colostomy on November 25, 2003 by Dr. Ezzard Standing emergently.  She is      scheduled for possible reversal of colostomy in the near future.  2.  The only other surgery she has  had is a tonsillectomy in 1955.   CURRENT MEDICATIONS:  1.  Noted to have just finished a Z-Pak for upper respiratory symptoms.  2.  Aspirin 325 mg daily.  3.  Xanax 0.25 mg p.r.n.  4.  Zyrtec 10 mg daily.  5.  Singulair 10 mg daily.  6.  Mucinex 600 mg daily.  7.  Synthroid 125 mcg daily.  8.  Nasacort p.r.n.  9.  Advair 250/50 one daily.  10. Fosamax 70 mg q week.  11. Multivitamin daily.  12. Caltrate 600 Plus b.i.d.  13. Trimethoprim 100 mg daily.  14. MiraLax daily.   REVIEW OF SYMPTOMS:  Please see history of present illness for significant  positives.  Otherwise, notable for chronic constipation, occasional  headache.  Otherwise, noncontributory.   FAMILY HISTORY:  Remarkable for mother decreased from lung cancer.  Father  had a history of cerebrovascular accident.  She has 1 sister who has  dementia and a previous CVA.  Another sister  has a history of TIAs.  A  brother has a history of lung cancer, as well as another brother who had a  history of pancreas cancer.   SOCIAL HISTORY:  She is married.  She is retired.  Uses no alcohol.  Tobacco  use none.   PHYSICAL EXAMINATION:  VITAL SIGNS:  Blood pressure 118/78, pulse 68,  respirations 12.  GENERAL:  This is a 77 year old white female in no acute distress, alert and  oriented x3.  HEENT:  Normocephalic and atraumatic.  Pupils equal, round and reactive to  light.  Extraocular movements intact.  She has full dentures.  Pharynx is  clear of exudates or erythema.  There are no obvious lesions.  NECK:  Supple.  No jugular venous distention.  No audible bruits.  No  lymphadenopathy.  CHEST:  Symmetrical on inspiration.  LUNGS:  Clear without wheezes, rhonchi, or rales.  CARDIAC:  Regular rate and rhythm.  There is a 2/6 aortic systolic murmur.  There are no rubs, no gallops.  ABDOMEN:  Soft, nontender.  The colostomy site is pink.  It appears to be  functioning well, with gas and a small amount of stool in the bag.   Normoactive bowel sounds.  No organomegaly or masses noted.  GENITOURINARY/RECTAL EXAMS:  Deferred.  EXTREMITIES:  No clubbing, cyanosis, or edema.  No ulcerations.  SKIN:  Warm to the touch.  PULSES:  Peripheral pulses are intact and equal bilaterally.  NEUROLOGIC:  Nonfocal.  Cranial nerves II-XII are grossly intact.  Gait is  steady.  Muscle strength is equal and intact bilaterally.   ASSESSMENT:  50.  A 77 year old female with symptomatic right internal carotid artery      stenosis for elective carotid endarterectomy so as to lower her risk of      cerebrovascular accident.  2.  Other diagnoses as listed.       WEG/MEDQ  D:  03/06/2004  T:  03/06/2004  Job:  [id number]   cc:   Larina Earthly, M.D.  [address]  [place]  [place] [address]  Fax: [phone]

## 2010-12-17 ENCOUNTER — Other Ambulatory Visit: Payer: Self-pay | Admitting: Internal Medicine

## 2010-12-17 DIAGNOSIS — Z1231 Encounter for screening mammogram for malignant neoplasm of breast: Secondary | ICD-10-CM

## 2011-01-18 ENCOUNTER — Ambulatory Visit: Payer: Self-pay

## 2011-01-24 ENCOUNTER — Ambulatory Visit
Admission: RE | Admit: 2011-01-24 | Discharge: 2011-01-24 | Disposition: A | Discharge status: Home / Self Care | Payer: No Typology Code available for payment source | Source: Ambulatory Visit | Attending: Internal Medicine | Admitting: Internal Medicine

## 2011-01-24 DIAGNOSIS — Z1231 Encounter for screening mammogram for malignant neoplasm of breast: Secondary | ICD-10-CM

## 2011-03-19 DIAGNOSIS — H2510 Age-related nuclear cataract, unspecified eye: Secondary | ICD-10-CM | POA: Diagnosis not present

## 2011-05-27 DIAGNOSIS — M25519 Pain in unspecified shoulder: Secondary | ICD-10-CM | POA: Diagnosis not present

## 2011-05-27 DIAGNOSIS — E785 Hyperlipidemia, unspecified: Secondary | ICD-10-CM | POA: Diagnosis not present

## 2011-05-27 DIAGNOSIS — E039 Hypothyroidism, unspecified: Secondary | ICD-10-CM | POA: Diagnosis not present

## 2011-05-27 DIAGNOSIS — I10 Essential (primary) hypertension: Secondary | ICD-10-CM | POA: Diagnosis not present

## 2011-11-14 DIAGNOSIS — E782 Mixed hyperlipidemia: Secondary | ICD-10-CM | POA: Diagnosis not present

## 2011-11-14 DIAGNOSIS — R002 Palpitations: Secondary | ICD-10-CM | POA: Diagnosis not present

## 2011-11-26 DIAGNOSIS — E785 Hyperlipidemia, unspecified: Secondary | ICD-10-CM | POA: Diagnosis not present

## 2011-11-26 DIAGNOSIS — I10 Essential (primary) hypertension: Secondary | ICD-10-CM | POA: Diagnosis not present

## 2011-11-26 DIAGNOSIS — E559 Vitamin D deficiency, unspecified: Secondary | ICD-10-CM | POA: Diagnosis not present

## 2011-11-26 DIAGNOSIS — Z23 Encounter for immunization: Secondary | ICD-10-CM | POA: Diagnosis not present

## 2011-11-26 DIAGNOSIS — E039 Hypothyroidism, unspecified: Secondary | ICD-10-CM | POA: Diagnosis not present

## 2012-01-06 ENCOUNTER — Other Ambulatory Visit: Payer: Self-pay | Admitting: Internal Medicine

## 2012-01-06 DIAGNOSIS — Z1231 Encounter for screening mammogram for malignant neoplasm of breast: Secondary | ICD-10-CM

## 2012-02-13 ENCOUNTER — Ambulatory Visit
Admission: RE | Admit: 2012-02-13 | Discharge: 2012-02-13 | Disposition: A | Discharge status: Home / Self Care | Payer: Medicare Other | Source: Ambulatory Visit | Attending: Internal Medicine | Admitting: Internal Medicine

## 2012-02-13 DIAGNOSIS — Z1231 Encounter for screening mammogram for malignant neoplasm of breast: Secondary | ICD-10-CM | POA: Diagnosis not present

## 2012-04-20 DIAGNOSIS — B351 Tinea unguium: Secondary | ICD-10-CM | POA: Diagnosis not present

## 2012-04-30 DIAGNOSIS — M25579 Pain in unspecified ankle and joints of unspecified foot: Secondary | ICD-10-CM | POA: Diagnosis not present

## 2012-04-30 DIAGNOSIS — L600 Ingrowing nail: Secondary | ICD-10-CM | POA: Diagnosis not present

## 2012-04-30 DIAGNOSIS — M79609 Pain in unspecified limb: Secondary | ICD-10-CM | POA: Diagnosis not present

## 2012-05-07 DIAGNOSIS — L600 Ingrowing nail: Secondary | ICD-10-CM | POA: Diagnosis not present

## 2012-05-20 DIAGNOSIS — M65979 Unspecified synovitis and tenosynovitis, unspecified ankle and foot: Secondary | ICD-10-CM | POA: Diagnosis not present

## 2012-05-20 DIAGNOSIS — M7150 Other bursitis, not elsewhere classified, unspecified site: Secondary | ICD-10-CM | POA: Diagnosis not present

## 2012-06-15 DIAGNOSIS — I10 Essential (primary) hypertension: Secondary | ICD-10-CM | POA: Diagnosis not present

## 2012-06-15 DIAGNOSIS — H532 Diplopia: Secondary | ICD-10-CM | POA: Diagnosis not present

## 2012-06-15 DIAGNOSIS — M899 Disorder of bone, unspecified: Secondary | ICD-10-CM | POA: Diagnosis not present

## 2012-06-15 DIAGNOSIS — E039 Hypothyroidism, unspecified: Secondary | ICD-10-CM | POA: Diagnosis not present

## 2012-06-15 DIAGNOSIS — Z1331 Encounter for screening for depression: Secondary | ICD-10-CM | POA: Diagnosis not present

## 2012-06-15 DIAGNOSIS — J309 Allergic rhinitis, unspecified: Secondary | ICD-10-CM | POA: Diagnosis not present

## 2012-06-15 DIAGNOSIS — E785 Hyperlipidemia, unspecified: Secondary | ICD-10-CM | POA: Diagnosis not present

## 2012-07-17 DIAGNOSIS — H2510 Age-related nuclear cataract, unspecified eye: Secondary | ICD-10-CM | POA: Diagnosis not present

## 2012-07-17 DIAGNOSIS — R51 Headache: Secondary | ICD-10-CM | POA: Diagnosis not present

## 2012-07-17 DIAGNOSIS — H5000 Unspecified esotropia: Secondary | ICD-10-CM | POA: Diagnosis not present

## 2012-07-17 DIAGNOSIS — H5200 Hypermetropia, unspecified eye: Secondary | ICD-10-CM | POA: Diagnosis not present

## 2012-08-25 DIAGNOSIS — H2510 Age-related nuclear cataract, unspecified eye: Secondary | ICD-10-CM | POA: Diagnosis not present

## 2012-08-25 DIAGNOSIS — H2589 Other age-related cataract: Secondary | ICD-10-CM | POA: Diagnosis not present

## 2012-10-20 DIAGNOSIS — H2589 Other age-related cataract: Secondary | ICD-10-CM | POA: Diagnosis not present

## 2012-10-20 DIAGNOSIS — H2510 Age-related nuclear cataract, unspecified eye: Secondary | ICD-10-CM | POA: Diagnosis not present

## 2012-10-26 ENCOUNTER — Ambulatory Visit (INDEPENDENT_AMBULATORY_CARE_PROVIDER_SITE_OTHER): Payer: Medicare Other | Admitting: Cardiovascular Disease

## 2012-10-26 ENCOUNTER — Encounter: Payer: Self-pay | Admitting: Cardiovascular Disease

## 2012-10-26 ENCOUNTER — Ambulatory Visit: Payer: Medicare Other | Admitting: Cardiovascular Disease

## 2012-10-26 VITALS — BP 122/74 | HR 84 | Ht 67.5 in | Wt 171.5 lb

## 2012-10-26 DIAGNOSIS — E039 Hypothyroidism, unspecified: Secondary | ICD-10-CM

## 2012-10-26 DIAGNOSIS — R011 Cardiac murmur, unspecified: Secondary | ICD-10-CM

## 2012-10-26 DIAGNOSIS — I6521 Occlusion and stenosis of right carotid artery: Secondary | ICD-10-CM

## 2012-10-26 DIAGNOSIS — Z9849 Cataract extraction status, unspecified eye: Secondary | ICD-10-CM | POA: Insufficient documentation

## 2012-10-26 DIAGNOSIS — I10 Essential (primary) hypertension: Secondary | ICD-10-CM | POA: Diagnosis not present

## 2012-10-26 DIAGNOSIS — I6529 Occlusion and stenosis of unspecified carotid artery: Secondary | ICD-10-CM

## 2012-10-26 DIAGNOSIS — R0989 Other specified symptoms and signs involving the circulatory and respiratory systems: Secondary | ICD-10-CM

## 2012-10-26 DIAGNOSIS — E785 Hyperlipidemia, unspecified: Secondary | ICD-10-CM

## 2012-10-26 NOTE — Progress Notes (Signed)
Patient ID: Danielle [name], female   DOB: 08-10-1932, 79 y.o.   MRN: [id number]     HPI: Danielle [name], is a 79 y.o. female who presents to the office today for a one-year cardiology evaluation.  Danielle [name] is now 79 years old. She has a history of peripheral vascular disease and is status post right carotid endarterectomy in 2005. She also has a history of hypothyroidism, hypertension, palpitations, and hyperlipidemia. Also has a history of intermittent seasonal allergies. In January 2011 oh Doppler study showed normal systolic function with grade 1 diastolic dysfunction, mild MR and TR and mild aortic sclerosis. A nuclear perfusion study at that time revealed normal perfusion.  Her last carotid duplex study was in January 2012 was was mildly abnormal with irregular mixed density plaque but was without significant additional stenosis.  She saw Dr Danielle [name], for primary care. Laboratory last year in September 2013 showed a cholesterol of 136 triglyceride 64 HDL 50 LDL 73. Normal renal function. She was not anemic.  Danielle [name] recently underwent staged bilateral carotid surgery this summer by Dr. Randon [name] at [facility]. She tolerated surgery well from a cardiovascular standpoint. She denies recent leg swelling although she does have a prescription for when necessary Lasix. She is tolerating her Bystolic and denies any recent palpitations. She presents for one-year evaluation.  History reviewed. No pertinent past medical history.  Past surgical history is notable for right carotid endarterectomy and recent bilateral cataract surgeries.  No Known Allergies  Current Outpatient Prescriptions  Medication Sig Dispense Refill  . ALPRAZolam (XANAX) 0.5 MG tablet Take 0.5 mg by mouth as needed for sleep.      Marland Kitchen aspirin 325 MG tablet Take 325 mg by mouth daily.      . calcium carbonate (OS-CAL) 600 MG TABS tablet Take 600 mg by mouth 2 (two) times daily with a meal.      . cetirizine (ZYRTEC)  10 MG tablet Take 10 mg by mouth daily.      . cholecalciferol (VITAMIN D) 1000 UNITS tablet Take 1,000 Units by mouth daily.      . furosemide (LASIX) 20 MG tablet Take 20 mg by mouth as needed.      Marland Kitchen guaiFENesin (MUCINEX) 600 MG 12 hr tablet Take 1,200 mg by mouth 2 (two) times daily.      . Omega-3 Fatty Acids (FISH OIL) 1000 MG CAPS Take 1 capsule by mouth daily.      Marland Kitchen alendronate (FOSAMAX) 70 MG tablet Take 1 tablet by mouth once a week.      Marland Kitchen atorvastatin (LIPITOR) 10 MG tablet Take 1 tablet by mouth daily.      Marland Kitchen BYSTOLIC 10 MG tablet Take 1 tablet by mouth daily.      . meloxicam (MOBIC) 15 MG tablet Take 1 tablet by mouth daily.      . polyethylene glycol powder (GLYCOLAX/MIRALAX) powder       . SYNTHROID 125 MCG tablet Take 1 tablet by mouth daily.      Marland Kitchen VIGAMOX 0.5 % ophthalmic solution Place 1 drop into both eyes 4 (four) times daily.       No current facility-administered medications for this visit.    History   Social History  . Marital Status: Married    Spouse Name: N/A    Number of Children: N/A  . Years of Education: N/A   Occupational History  . Not on file.   Social History Main Topics  . Smoking  status: Former Games developer  . Smokeless tobacco: Never Used     Comment: quit aprrox 20 years ago.  . Alcohol Use: No  . Drug Use: Not on file  . Sexual Activity: Not on file   Other Topics Concern  . Not on file   Social History Narrative  . No narrative on file    Family history is notable that both parents are deceased mother at age 57 with cancer father at age 23. Her brother is deceased also and had various cancers. His sister is also deceased.   ROS is negative for fevers, chills or night sweats. She denies paresthesias. Her vision is improved following her recent cataract surgery. She denies wheezing. She is unaware of recent tachycardia palpitations. There is no chest pressure. She does find that time her memory is not as sharp as it had been as  forgetful concerning dates. She denies abdominal pain. She denies change in bowel or bladder habits. She does take Fosamax for osteopenia versus osteoporosis. She'll he rarely takes her furosemide if she does have leg swelling. He denies claudication. Other system review is negative.  PE BP 122/74  Pulse 84  Ht 5' 7.5" (1.715 m)  Wt 171 lb 8 oz (77.792 kg)  BMI 26.45 kg/m2  General: Alert, oriented, no distress.  Skin: normal turgor, no rashes HEENT: Normocephalic, atraumatic. Pupils round and reactive; sclera anicteric;no lid lag.  Nose without nasal septal hypertrophy Mouth/Parynx benign; Mallinpatti scale 2 Neck: No JVD, well-healed right carotid artery scar. Lungs: clear to ausculatation and percussion; no wheezing or rales Heart: RRR, s1 s2 normal 2/6 systolic murmur. No S3 gallop or Abdomen: soft, nontender; no hepatosplenomehaly, BS+; abdominal aorta nontender and not dilated by palpation. Pulses 2+ Extremities: no clubbing cyanosis or edema, Homan's sign negative  Neurologic: grossly nonfocal  ECG: Sinus rhythm 84 beats per minute period. We'll 168 ms. QTc interval 470 ms. No significant ST-T changes.  LABS:  BMET    Component Value Date/Time   NA 138 12/11/2008 1221   K 3.6 12/11/2008 1221   CL 108 12/11/2008 1221   CO2 27 12/11/2008 1221   GLUCOSE 113* 12/11/2008 1221   BUN 13 12/11/2008 1221   CREATININE 1.03 12/11/2008 1221   CALCIUM 9.4 12/11/2008 1221   GFRNONAA 52* 12/11/2008 1221   GFRAA  Value: >60        The eGFR has been calculated using the MDRD equation. This calculation has not been validated in all clinical situations. eGFR's persistently <60 mL/min signify possible Chronic Kidney Disease. 12/11/2008 1221     Hepatic Function Panel     Component Value Date/Time   PROT 7.4 12/11/2008 1221   ALBUMIN 4.1 12/11/2008 1221   AST 34 12/11/2008 1221   ALT 27 12/11/2008 1221   ALKPHOS 83 12/11/2008 1221   BILITOT 1.1 12/11/2008 1221     CBC    Component Value  Date/Time   WBC 6.5 12/11/2008 1221   RBC 4.79 12/11/2008 1221   HGB 14.8 12/11/2008 1221   HCT 43.4 12/11/2008 1221   PLT 167 12/11/2008 1221   MCV 90.7 12/11/2008 1221   MCHC 34.1 12/11/2008 1221   RDW 13.2 12/11/2008 1221   LYMPHSABS 1.5 12/11/2008 1221   MONOABS 0.8 12/11/2008 1221   EOSABS 0.1 12/11/2008 1221   BASOSABS 0.0 12/11/2008 1221     BNP No results found for this basename: probnp    Lipid Panel  No results found for this basename: chol, trig, hdl, cholhdl,  vldl, ldlcalc     RADIOLOGY: No results found.    ASSESSMENT AND PLAN: [name] is a 79 year old female has a history of  hypertension, hyperlipidemia, tachypalpitations, and peripheral vascular disease. She is now 9 years status post right carotid carotid endarterectomy. She does have a cardiac murmur suggestive of aortic sclerosis. Her last echo Doppler study was in January 2011. Her last carotid duplex scan was in January 2012. She tells me she will be seeing Dr. Felipa [name] in September additional laboratory will be drawn at that time. In 2015, I'm recommending she undergo a four-year followup echo Doppler study to reassess her systolic and diastolic function as well as valvular architecture. Also recommending she undergo a three-year followup carotid duplex scan. I will see her back after these studies in 6 months, further recommendations will be made at that time.     Lennette Bihari, MD, [facility]  10/26/2012 10:10 AM

## 2012-10-26 NOTE — Patient Instructions (Addendum)
Your physician has requested that you have a carotid duplex. This test is an ultrasound of the carotid arteries in your neck. It looks at blood flow through these arteries that supply the brain with blood. Allow one hour for this exam. There are no restrictions or special instructions.  Your physician has requested that you have an echocardiogram. Echocardiography is a painless test that uses sound waves to create images of your heart. It provides your doctor with information about the size and shape of your heart and how well your heart's chambers and valves are working. This procedure takes approximately one hour. There are no restrictions for this procedure. These tests will be scheduled for October 2014.  Your physician has requested that you have a carotid duplex. This test is an ultrasound of the carotid arteries in your neck. It looks at blood flow through these arteries that supply the brain with blood. Allow one hour for this exam. There are no restrictions or special instructions.  Your physician recommends that you schedule a follow-up appointment in: 6 MONTHS.

## 2012-11-12 ENCOUNTER — Other Ambulatory Visit (HOSPITAL_COMMUNITY): Payer: Self-pay | Admitting: *Deleted

## 2012-12-15 DIAGNOSIS — R209 Unspecified disturbances of skin sensation: Secondary | ICD-10-CM | POA: Diagnosis not present

## 2012-12-15 DIAGNOSIS — Z23 Encounter for immunization: Secondary | ICD-10-CM | POA: Diagnosis not present

## 2012-12-15 DIAGNOSIS — I10 Essential (primary) hypertension: Secondary | ICD-10-CM | POA: Diagnosis not present

## 2012-12-15 DIAGNOSIS — E785 Hyperlipidemia, unspecified: Secondary | ICD-10-CM | POA: Diagnosis not present

## 2012-12-15 DIAGNOSIS — Z6826 Body mass index (BMI) 26.0-26.9, adult: Secondary | ICD-10-CM | POA: Diagnosis not present

## 2012-12-15 DIAGNOSIS — I739 Peripheral vascular disease, unspecified: Secondary | ICD-10-CM | POA: Diagnosis not present

## 2012-12-15 DIAGNOSIS — E039 Hypothyroidism, unspecified: Secondary | ICD-10-CM | POA: Diagnosis not present

## 2012-12-15 DIAGNOSIS — H1030 Unspecified acute conjunctivitis, unspecified eye: Secondary | ICD-10-CM | POA: Diagnosis not present

## 2012-12-23 ENCOUNTER — Other Ambulatory Visit: Payer: Self-pay | Admitting: Cardiovascular Disease

## 2012-12-23 NOTE — Telephone Encounter (Signed)
Rx was sent to pharmacy electronically. 

## 2012-12-28 ENCOUNTER — Ambulatory Visit (HOSPITAL_COMMUNITY)
Admission: RE | Admit: 2012-12-28 | Discharge: 2012-12-28 | Disposition: A | Discharge status: Home / Self Care | Payer: Medicare Other | Source: Ambulatory Visit | Attending: Cardiovascular Disease | Admitting: Cardiovascular Disease

## 2012-12-28 DIAGNOSIS — I6521 Occlusion and stenosis of right carotid artery: Secondary | ICD-10-CM

## 2012-12-28 DIAGNOSIS — R0989 Other specified symptoms and signs involving the circulatory and respiratory systems: Secondary | ICD-10-CM | POA: Insufficient documentation

## 2012-12-28 DIAGNOSIS — I6529 Occlusion and stenosis of unspecified carotid artery: Secondary | ICD-10-CM | POA: Insufficient documentation

## 2012-12-28 NOTE — Progress Notes (Signed)
Carotid Duplex Completed. Lavona Norsworthy, BS, RDMS, RVT  

## 2013-01-04 ENCOUNTER — Ambulatory Visit (HOSPITAL_COMMUNITY)
Admission: RE | Admit: 2013-01-04 | Discharge: 2013-01-04 | Disposition: A | Discharge status: Home / Self Care | Payer: Medicare Other | Source: Ambulatory Visit | Attending: Cardiovascular Disease | Admitting: Cardiovascular Disease

## 2013-01-04 DIAGNOSIS — R011 Cardiac murmur, unspecified: Secondary | ICD-10-CM

## 2013-01-04 NOTE — Progress Notes (Signed)
2D Echo Performed 01/04/2013    Kathelene Rumberger, RCS  

## 2013-01-15 ENCOUNTER — Other Ambulatory Visit: Payer: Self-pay

## 2013-01-15 DIAGNOSIS — Z1231 Encounter for screening mammogram for malignant neoplasm of breast: Secondary | ICD-10-CM

## 2013-01-28 ENCOUNTER — Encounter: Payer: Self-pay | Admitting: *Deleted

## 2013-01-28 NOTE — Progress Notes (Signed)
Quick Note:  Note sent to patient ______ 

## 2013-02-22 ENCOUNTER — Ambulatory Visit
Admission: RE | Admit: 2013-02-22 | Discharge: 2013-02-22 | Disposition: A | Discharge status: Home / Self Care | Payer: Medicare Other | Source: Ambulatory Visit

## 2013-02-22 DIAGNOSIS — Z1231 Encounter for screening mammogram for malignant neoplasm of breast: Secondary | ICD-10-CM | POA: Diagnosis not present

## 2013-05-03 DIAGNOSIS — J209 Acute bronchitis, unspecified: Secondary | ICD-10-CM | POA: Diagnosis not present

## 2013-05-03 DIAGNOSIS — M25519 Pain in unspecified shoulder: Secondary | ICD-10-CM | POA: Diagnosis not present

## 2013-05-03 DIAGNOSIS — Z6826 Body mass index (BMI) 26.0-26.9, adult: Secondary | ICD-10-CM | POA: Diagnosis not present

## 2013-05-19 DIAGNOSIS — M19019 Primary osteoarthritis, unspecified shoulder: Secondary | ICD-10-CM | POA: Diagnosis not present

## 2013-05-26 ENCOUNTER — Encounter: Payer: Self-pay | Admitting: Cardiovascular Disease

## 2013-05-26 ENCOUNTER — Ambulatory Visit (INDEPENDENT_AMBULATORY_CARE_PROVIDER_SITE_OTHER): Payer: Medicare Other | Admitting: Cardiovascular Disease

## 2013-05-26 VITALS — BP 122/60 | HR 67 | Ht 60.5 in | Wt 174.5 lb

## 2013-05-26 DIAGNOSIS — E785 Hyperlipidemia, unspecified: Secondary | ICD-10-CM

## 2013-05-26 DIAGNOSIS — I359 Nonrheumatic aortic valve disorder, unspecified: Secondary | ICD-10-CM

## 2013-05-26 DIAGNOSIS — I6529 Occlusion and stenosis of unspecified carotid artery: Secondary | ICD-10-CM

## 2013-05-26 DIAGNOSIS — I3481 Nonrheumatic mitral (valve) annulus calcification: Secondary | ICD-10-CM

## 2013-05-26 DIAGNOSIS — I10 Essential (primary) hypertension: Secondary | ICD-10-CM | POA: Diagnosis not present

## 2013-05-26 DIAGNOSIS — R002 Palpitations: Secondary | ICD-10-CM

## 2013-05-26 DIAGNOSIS — I059 Rheumatic mitral valve disease, unspecified: Secondary | ICD-10-CM

## 2013-05-26 DIAGNOSIS — I358 Other nonrheumatic aortic valve disorders: Secondary | ICD-10-CM

## 2013-05-26 DIAGNOSIS — E039 Hypothyroidism, unspecified: Secondary | ICD-10-CM

## 2013-05-26 NOTE — Patient Instructions (Signed)
Your physician recommends that you schedule a follow-up appointment in: 6 months.  Your physician has recommended you make the following change in your medication: increase the Bystolic as directed by Dr. [name] if you notice increased palpitations.  If it is determined that you will have shoulder surgery, then please call to notify. You will need to have a lexiscan myoview prior to any surgery.

## 2013-06-06 ENCOUNTER — Encounter: Payer: Self-pay | Admitting: Cardiovascular Disease

## 2013-06-06 DIAGNOSIS — I059 Rheumatic mitral valve disease, unspecified: Secondary | ICD-10-CM | POA: Insufficient documentation

## 2013-06-06 DIAGNOSIS — I3481 Nonrheumatic mitral (valve) annulus calcification: Secondary | ICD-10-CM | POA: Insufficient documentation

## 2013-06-06 DIAGNOSIS — R002 Palpitations: Secondary | ICD-10-CM | POA: Insufficient documentation

## 2013-06-06 DIAGNOSIS — I358 Other nonrheumatic aortic valve disorders: Secondary | ICD-10-CM | POA: Insufficient documentation

## 2013-06-06 NOTE — Progress Notes (Signed)
Patient ID: Danielle [name], female   DOB: 05-20-1932, 80 y.o.   MRN: [id number]      HPI: Danielle [name] is a 80 y.o. female who presents to the office today for a six month cardiology evaluation.  Danielle [name] has a history of peripheral vascular disease and is status post right carotid endarterectomy in 2005. She also has a history of hypothyroidism, hypertension, palpitations,  hyperlipidemia and has intermittent seasonal allergies. In January 2011 Echo Doppler study showed normal systolic function with grade 1 diastolic dysfunction, mild MR and TR and mild aortic sclerosis. A nuclear perfusion study at that time revealed normal perfusion.  Her last carotid duplex study was in January 2012 was was mildly abnormal with irregular mixed density plaque but was without significant additional stenosis.  She saw Dr Dagmar Hait, for primary care. Laboratory last year in September 2013 showed a cholesterol of 136 triglyceride 64 HDL 50 LDL 73. Normal renal function. She was not anemic.  Ms. Marasco recently underwent  bilateral carotid surgery last summer by Dr. Prudencio Burly at [facility]. She tolerated surgery well from a cardiovascular standpoint. She denies recent leg swelling although she does have a prescription for when necessary Lasix. She is tolerating her Bystolic and denies any recent palpitations.    As I last saw her, she has noticed occasional palpitations. She does note occasional right shoulder discomfort. Her daughter, who is my patient Publishing copy) recently started dialysis therapy. She denies exertional precipitation to the shoulder discomfort.  She did undergo an echo Doppler study on 01/04/2013. This showed an ejection fraction of 60-65%. She had normal wall thickness and did not have wall motion abnormalities. There was evidence for grade 1 diastolic dysfunction. She had a sclerotic aortic valve with trace to mild AR, and also had moderate mitral annular calcification with  mild to moderate MR.  She presents for evaluation.  History reviewed. No pertinent past medical history.  Past surgical history is notable for right carotid endarterectomy and recent bilateral cataract surgeries.  No Known Allergies  Current Outpatient Prescriptions  Medication Sig Dispense Refill  . alendronate (FOSAMAX) 70 MG tablet Take 1 tablet by mouth once a week.      . ALPRAZolam (XANAX) 0.5 MG tablet Take 0.5 mg by mouth as needed for sleep.      Marland Kitchen aspirin 325 MG tablet Take 325 mg by mouth daily.      Marland Kitchen atorvastatin (LIPITOR) 10 MG tablet Take 1 tablet by mouth daily.      Marland Kitchen BYSTOLIC 10 MG tablet TAKE 1 TABLET DAILY  90 tablet  2  . calcium carbonate (OS-CAL) 600 MG TABS tablet Take 600 mg by mouth 2 (two) times daily with a meal.      . cetirizine (ZYRTEC) 10 MG tablet Take 10 mg by mouth daily.      . cholecalciferol (VITAMIN D) 1000 UNITS tablet Take 1,000 Units by mouth daily. 2 tabs      . fluticasone (FLONASE) 50 MCG/ACT nasal spray Place 1 spray into both nostrils daily.      Marland Kitchen guaiFENesin (MUCINEX) 600 MG 12 hr tablet Take 1,200 mg by mouth 2 (two) times daily.      Marland Kitchen OVER THE COUNTER MEDICATION Take 1 capsule by mouth daily. Mega red-3      . polyethylene glycol powder (GLYCOLAX/MIRALAX) powder       . PROAIR HFA 108 (90 BASE) MCG/ACT inhaler Inhale 1 puff into the lungs as needed.      Marland Kitchen  SYNTHROID 125 MCG tablet Take 1 tablet by mouth daily.       No current facility-administered medications for this visit.    History   Social History  . Marital Status: Married    Spouse Name: N/A    Number of Children: N/A  . Years of Education: N/A   Occupational History  . Not on file.   Social History Main Topics  . Smoking status: Former Research scientist (life sciences)  . Smokeless tobacco: Never Used     Comment: quit aprrox 20 years ago.  . Alcohol Use: No  . Drug Use: Not on file  . Sexual Activity: Not on file   Other Topics Concern  . Not on file   Social History Narrative  .  No narrative on file    Family history is notable that both parents are deceased mother at age 44 with cancer father at age 78. Her brother is deceased also and had various cancers. His sister is also deceased.   ROS is negative for fevers, chills or night sweats.  Her vision is improved following her recent cataract surgery. There is no hearing difficulty. She is unaware of lymphadenopathy. She denies wheezing. She is unaware of recent tachycardia palpitations. There is no chest pressure. She does find that time her memory is not as sharp as it had been as forgetful concerning dates. She denies abdominal pain. She denies change in bowel or bladder habits. She does take Fosamax for osteopenia versus osteoporosis. She does note shoulder discomfort. This does not seem to be exertionally precipitated. She'll he rarely takes her furosemide if she does have leg swelling. He denies claudication. She denies paresthesias. Other system review is negative.  PE BP 122/60  Pulse 67  Ht 5' 0.5" (1.537 m)  Wt 174 lb 8 oz (79.153 kg)  BMI 33.51 kg/m2  General: Alert, oriented, no distress.  Skin: normal turgor, no rashes HEENT: Normocephalic, atraumatic. Pupils round and reactive; sclera anicteric;no lid lag.  Nose without nasal septal hypertrophy Mouth/Parynx benign; Mallinpatti scale 2 Neck: No JVD, well-healed right carotid artery scar. No chest wall tenderness to palpation Lungs: clear to ausculatation and percussion; no wheezing or rales Heart: RRR, s1 s2 normal 2/6 systolic murmur. No S3 gallop no rubs thrills or heave Abdomen: soft, nontender; no hepatosplenomehaly, BS+; abdominal aorta nontender and not dilated by palpation. Back: No CVA tenderness Pulses 2+ Extremities: no clubbing cyanosis or edema, Homan's sign negative  Neurologic: grossly nonfocal Psychological: Normal affect and mood  ECG (independently but not read by me): Normal sinus rhythm with PACs and a transient trigeminal  rhythm.  ECG: Sinus rhythm 84 beats per minute period. We'll 168 ms. QTc interval 470 ms. No significant ST-T changes.  LABS:  BMET    Component Value Date/Time   NA 138 12/11/2008 1221   K 3.6 12/11/2008 1221   CL 108 12/11/2008 1221   CO2 27 12/11/2008 1221   GLUCOSE 113* 12/11/2008 1221   BUN 13 12/11/2008 1221   CREATININE 1.03 12/11/2008 1221   CALCIUM 9.4 12/11/2008 1221   GFRNONAA 52* 12/11/2008 1221   GFRAA  Value: >60        The eGFR has been calculated using the MDRD equation. This calculation has not been validated in all clinical situations. eGFR's persistently <60 mL/min signify possible Chronic Kidney Disease. 12/11/2008 1221     Hepatic Function Panel     Component Value Date/Time   PROT 7.4 12/11/2008 1221   ALBUMIN 4.1 12/11/2008 1221  AST 34 12/11/2008 1221   ALT 27 12/11/2008 1221   ALKPHOS 83 12/11/2008 1221   BILITOT 1.1 12/11/2008 1221     CBC    Component Value Date/Time   WBC 6.5 12/11/2008 1221   RBC 4.79 12/11/2008 1221   HGB 14.8 12/11/2008 1221   HCT 43.4 12/11/2008 1221   PLT 167 12/11/2008 1221   MCV 90.7 12/11/2008 1221   MCHC 34.1 12/11/2008 1221   RDW 13.2 12/11/2008 1221   LYMPHSABS 1.5 12/11/2008 1221   MONOABS 0.8 12/11/2008 1221   EOSABS 0.1 12/11/2008 1221   BASOSABS 0.0 12/11/2008 1221     BNP No results found for this basename: probnp    Lipid Panel  No results found for this basename: chol,  trig,  hdl,  cholhdl,  vldl,  ldlcalc     RADIOLOGY: No results found.    ASSESSMENT AND PLAN: Ms. Roslynn Holte is a 80 year old female has a history of  hypertension, hyperlipidemia, tachypalpitations, and peripheral vascular disease. She is now 10 years status post right carotid carotid endarterectomy. She does have a cardiac murmur suggestive of aortic sclerosis. Her last echo Doppler study was in January 2011. Her last carotid duplex scan was in January 2012. Her recent echo Doppler study shows normal systolic function. She did have aortic valve  [place] is without stenosis with trace aortic insufficiency. She also had mitral annular calcification with mild to moderate central regurgitation. There was grade 1 diastolic dysfunction. She does have APCs noted on her ECG today. She has been taking systolic at 10 mg. If palpitations continue and I have suggested she take an extra 5 mg of diastolic a daily basis. I also reviewed her recent carotid Doppler study which was done on 12/28/2012. This demonstrated a small amount of irregular mixed density plaque without evidence for diameter reduction. She had laboratory drawn by her primary physician as well as the most recent laboratory be sent for me to review. As long as she remains stable, I will see her in 6 months for cardiology reevaluation.   [name], MD, [facility]  06/06/2013 10:00 AM

## 2013-06-15 ENCOUNTER — Telehealth: Payer: Self-pay | Admitting: Cardiovascular Disease

## 2013-06-15 NOTE — Telephone Encounter (Signed)
Please call,concerning a letter she received about her Bystolic,

## 2013-06-15 NOTE — Telephone Encounter (Signed)
Returned call and pt verified x 2.  Pt stated she received a letter to contact her doctor about her Bystolic.  Stated they said they would send a PA to continue the prescription.    Call [phone] for PA.  Message forwarded to [place], [place].

## 2013-06-16 ENCOUNTER — Other Ambulatory Visit: Payer: Self-pay | Admitting: Orthopedic Surgery

## 2013-06-16 ENCOUNTER — Ambulatory Visit
Admission: RE | Admit: 2013-06-16 | Discharge: 2013-06-16 | Disposition: A | Discharge status: Home / Self Care | Payer: Medicare Other | Source: Ambulatory Visit | Attending: Orthopedic Surgery | Admitting: Orthopedic Surgery

## 2013-06-16 DIAGNOSIS — S42143A Displaced fracture of glenoid cavity of scapula, unspecified shoulder, initial encounter for closed fracture: Secondary | ICD-10-CM

## 2013-06-16 DIAGNOSIS — Z0489 Encounter for examination and observation for other specified reasons: Secondary | ICD-10-CM

## 2013-06-16 DIAGNOSIS — IMO0002 Reserved for concepts with insufficient information to code with codable children: Secondary | ICD-10-CM

## 2013-06-16 DIAGNOSIS — Z01818 Encounter for other preprocedural examination: Secondary | ICD-10-CM | POA: Diagnosis not present

## 2013-06-16 DIAGNOSIS — S42153A Displaced fracture of neck of scapula, unspecified shoulder, initial encounter for closed fracture: Principal | ICD-10-CM

## 2013-06-16 DIAGNOSIS — M19019 Primary osteoarthritis, unspecified shoulder: Secondary | ICD-10-CM | POA: Diagnosis not present

## 2013-06-16 NOTE — Telephone Encounter (Signed)
Left message that I have contacted [facility] and they have approved her Bystolic starting today.

## 2013-06-17 ENCOUNTER — Other Ambulatory Visit: Payer: Self-pay | Admitting: Orthopedic Surgery

## 2013-06-21 ENCOUNTER — Telehealth (HOSPITAL_COMMUNITY): Payer: Self-pay | Admitting: *Deleted

## 2013-06-21 NOTE — Telephone Encounter (Signed)
Pt states that Dr. [name] wants her to have a Lexiscan prior to her surgery. Can an order be placed so that I can schedule the patient as soon as possible.  Thanks

## 2013-06-23 DIAGNOSIS — E039 Hypothyroidism, unspecified: Secondary | ICD-10-CM | POA: Diagnosis not present

## 2013-06-23 DIAGNOSIS — E559 Vitamin D deficiency, unspecified: Secondary | ICD-10-CM | POA: Diagnosis not present

## 2013-06-23 DIAGNOSIS — I10 Essential (primary) hypertension: Secondary | ICD-10-CM | POA: Diagnosis not present

## 2013-06-23 DIAGNOSIS — Z1331 Encounter for screening for depression: Secondary | ICD-10-CM | POA: Diagnosis not present

## 2013-06-23 DIAGNOSIS — M25519 Pain in unspecified shoulder: Secondary | ICD-10-CM | POA: Diagnosis not present

## 2013-06-23 DIAGNOSIS — M899 Disorder of bone, unspecified: Secondary | ICD-10-CM | POA: Diagnosis not present

## 2013-06-23 DIAGNOSIS — J309 Allergic rhinitis, unspecified: Secondary | ICD-10-CM | POA: Diagnosis not present

## 2013-06-23 DIAGNOSIS — M949 Disorder of cartilage, unspecified: Secondary | ICD-10-CM | POA: Diagnosis not present

## 2013-06-23 DIAGNOSIS — E785 Hyperlipidemia, unspecified: Secondary | ICD-10-CM | POA: Diagnosis not present

## 2013-06-24 ENCOUNTER — Other Ambulatory Visit: Payer: Self-pay | Admitting: *Deleted

## 2013-06-24 DIAGNOSIS — Z01818 Encounter for other preprocedural examination: Secondary | ICD-10-CM

## 2013-06-24 DIAGNOSIS — E782 Mixed hyperlipidemia: Secondary | ICD-10-CM

## 2013-06-24 DIAGNOSIS — I10 Essential (primary) hypertension: Secondary | ICD-10-CM

## 2013-06-25 ENCOUNTER — Telehealth (HOSPITAL_COMMUNITY): Payer: Self-pay | Admitting: *Deleted

## 2013-06-30 ENCOUNTER — Ambulatory Visit (HOSPITAL_COMMUNITY)
Admission: RE | Admit: 2013-06-30 | Discharge: 2013-06-30 | Disposition: A | Discharge status: Home / Self Care | Payer: Medicare Other | Source: Ambulatory Visit | Attending: Cardiology | Admitting: Cardiology

## 2013-06-30 DIAGNOSIS — E785 Hyperlipidemia, unspecified: Secondary | ICD-10-CM | POA: Diagnosis not present

## 2013-06-30 DIAGNOSIS — E782 Mixed hyperlipidemia: Secondary | ICD-10-CM

## 2013-06-30 DIAGNOSIS — I10 Essential (primary) hypertension: Secondary | ICD-10-CM

## 2013-06-30 DIAGNOSIS — Z01810 Encounter for preprocedural cardiovascular examination: Secondary | ICD-10-CM | POA: Diagnosis not present

## 2013-06-30 DIAGNOSIS — Z01818 Encounter for other preprocedural examination: Secondary | ICD-10-CM

## 2013-06-30 DIAGNOSIS — I359 Nonrheumatic aortic valve disorder, unspecified: Secondary | ICD-10-CM | POA: Diagnosis not present

## 2013-06-30 MED ORDER — TECHNETIUM TC 99M SESTAMIBI GENERIC - CARDIOLITE
10.0000 | Freq: Once | INTRAVENOUS | Status: AC | PRN
  Administered 2013-06-30: 10 via INTRAVENOUS

## 2013-06-30 MED ORDER — TECHNETIUM TC 99M SESTAMIBI GENERIC - CARDIOLITE
30.0000 | Freq: Once | INTRAVENOUS | Status: AC | PRN
  Administered 2013-06-30: 30 via INTRAVENOUS

## 2013-06-30 MED ORDER — REGADENOSON 0.4 MG/5ML IV SOLN
0.4000 mg | Freq: Once | INTRAVENOUS | Status: AC
Start: 2013-06-30 — End: 2013-06-30
  Administered 2013-06-30: 0.4 mg via INTRAVENOUS

## 2013-06-30 MED ORDER — AMINOPHYLLINE 25 MG/ML IV SOLN
75.0000 mg | Freq: Once | INTRAVENOUS | Status: AC
  Administered 2013-06-30: 75 mg via INTRAVENOUS

## 2013-06-30 NOTE — Procedures (Addendum)
[place] [address] [place] [address] [phone]  Cardiology Nuclear Med Study  Danielle [name] is a 80 y.o. female     MRN : [id number]     DOB: 18-Mar-1932  Procedure Date: 06/30/2013  Nuclear Med Background Indication for Stress Test:  Surgical Clearance History:  Aortic valve sclerosis, Nuclear Exercise MPI 03/20/09 nonischemic EF=69% Cardiac Risk Factors: Carotid Disease, History of Smoking, Hypertension, Lipids, Overweight and PVD  Symptoms:  Palpitations   Nuclear Pre-Procedure Caffeine/Decaff Intake:  7:00pm NPO After: 5:00am   IV Site: R Antecubital  IV 0.9% NS with Angio Cath:  22g  Chest Size (in):  n/a IV Started by: Otho Perl, CNMT  Height:    Cup Size: 40C  BMI:  There is no weight on file to calculate BMI. Weight:      Tech Comments:  n/a    Nuclear Med Study 1 or 2 day study: 1 day  Stress Test Type:  [place] Provider:  Shelva Majestic, MD   Resting Radionuclide: Technetium 72m Sestamibi  Resting Radionuclide Dose: 10.3 mCi   Stress Radionuclide:  Technetium 63m Sestamibi  Stress Radionuclide Dose: 31.0 mCi           Stress Protocol Rest HR: 65 Stress HR: 76  Rest BP: 140/89 Stress BP: 154/83  Exercise Time (min): n/a METS: n/a   Predicted Max HR: 142 bpm % Max HR: 57.75 bpm Rate Pressure Product: 12792  Dose of Adenosine (mg):  n/a Dose of Lexiscan: 0.4 mg  Dose of Atropine (mg): n/a Dose of Dobutamine: n/a mcg/kg/min (at max HR)  Stress Test Technologist: Leane Para, CCT Nuclear Technologist: Otho Perl, CNMT   Rest Procedure:  Myocardial perfusion imaging was performed at rest 45 minutes following the intravenous administration of Technetium 13m Sestamibi. Stress Procedure:  The patient received IV Lexiscan 0.4 mg over 15-seconds.  Technetium 33m Sestamibi injected at 30-seconds.  There were no significant changes with Lexiscan.  Quantitative spect images  were obtained after a 45 minute delay.  Transient Ischemic Dilatation (Normal <1.22):  0.74 Lung/Heart Ratio (Normal <0.45):  0.42 QGS EDV:  31 ml QGS ESV:  7 ml LV Ejection Fraction: 77%  Robin Moffitt, CNMT  PHYSICIAN INTERPRETATION  Rest ECG: NSR with non-specific ST-T wave changes  Stress ECG: No significant change from baseline ECG and No significant ST segment change suggestive of ischemia.  QPS Raw Data Images:  Combination of motion artifact and acquisition artifact is noted. An raw data, halfway through the rotation the intensity of tracer uptake is dramatically enhanced from midline to the right rotation.  Stress Images:  There is decreased uptake in the septum. there is a small to moderate sized mild intensity perfusion defect in the basal septal wall as well as the basal inferolateral wall. While the perfusion defects do appear to be more pronounced in stress versus rest imaging, they do not follow a specific vessel distribution in the absence of bypass grafting. Rest Images:  Comparison with the stress images reveals no significant change. Subtraction (SDS):  There is no evidence of scar or ischemia.  Impression Exercise Capacity:  Lexiscan with no exercise. BP Response:  Normal blood pressure response. Clinical Symptoms:  There is dyspnea. ECG Impression:  No significant ECG changes with Lexiscan. LV Wall Motion:  NL LV Function; NL Wall Motion; small ventricle size/40 likely least overestimated LVEF Comparison with Prior Nuclear Study: No significant change from previous study  Overall Impression:  Low risk stress nuclear study , with likely artifact related lack of tracer uptake in the basal septal wall.     [name], MD  06/30/2013 1:14 PM

## 2013-07-07 ENCOUNTER — Encounter (HOSPITAL_COMMUNITY): Payer: Self-pay | Admitting: Pharmacy Technician

## 2013-07-07 ENCOUNTER — Encounter: Payer: Self-pay | Admitting: *Deleted

## 2013-07-08 ENCOUNTER — Encounter: Payer: Self-pay | Admitting: Cardiovascular Disease

## 2013-07-10 NOTE — Pre-Procedure Instructions (Signed)
Danielle [name]  07/10/2013   Your procedure is scheduled on:  May 12  Report to [facility] Admitting at 05:30 AM.  Call this number if you have problems the morning of surgery: [phone]   Remember:   Do not eat food or drink liquids after midnight.   Take these medicines the morning of surgery with A SIP OF WATER: Albuterol (if needed), Zyrtec, Flonase, Levothyroxine, Bystolic, Mucinex   STOP Mega Red 3, Multiple Vitamins, Vitamin D, Os- Cal, Aspirin starting today   STOP/ Do not take Aspirin, Aleve, Naproxen, Advil, Ibuprofen, Vitamin, Herbs, or Supplements starting today   Do not wear jewelry, make-up or nail polish.  Do not wear lotions, powders, or perfumes. You may wear deodorant.  Do not shave 48 hours prior to surgery. Men may shave face and neck.  Do not bring valuables to the hospital.  [facility] is not responsible for any belongings or valuables.               Contacts, dentures or bridgework may not be worn into surgery.  Leave suitcase in the car. After surgery it may be brought to your room.  For patients admitted to the hospital, discharge time is determined by your treatment team.               Special Instructions: See [facility] Preparing For Surgery   Please read over the following fact sheets that you were given: Pain Booklet, Coughing and Deep Breathing, Blood Transfusion Information and Surgical Site Infection Prevention

## 2013-07-10 NOTE — Pre-Procedure Instructions (Signed)
- Preparing for Surgery  Before surgery, you can play an important role.  Because skin is not sterile, your skin needs to be as free of germs as possible.  You can reduce the number of germs on you skin by washing with CHG (chlorahexidine gluconate) soap before surgery.  CHG is an antiseptic cleaner which kills germs and bonds with the skin to continue killing germs even after washing.  Please DO NOT use if you have an allergy to CHG or antibacterial soaps.  If your skin becomes reddened/irritated stop using the CHG and inform your nurse when you arrive at Short Stay.  Do not shave (including legs and underarms) for at least 48 hours prior to the first CHG shower.  You may shave your face.  Please follow these instructions carefully:   1.  Shower with CHG Soap the night before surgery and the morning of Surgery.  2.  If you choose to wash your hair, wash your hair first as usual with your normal shampoo.  3.  After you shampoo, rinse your hair and body thoroughly to remove the shampoo.  4.  Use CHG as you would any other liquid soap.  You can apply CHG directly to the skin and wash gently with scrungie or a clean washcloth.  5.  Apply the CHG Soap to your body ONLY FROM THE NECK DOWN.  Do not use on open wounds or open sores.  Avoid contact with your eyes, ears, mouth and genitals (private parts).  Wash genitals (private parts) with your normal soap.  6.  Wash thoroughly, paying special attention to the area where your surgery will be performed.  7.  Thoroughly rinse your body with warm water from the neck down.  8.  DO NOT shower/wash with your normal soap after using and rinsing off the CHG Soap.  9.  Pat yourself dry with a clean towel.            10.  Wear clean pajamas.            11.  Place clean sheets on your bed the night of your first shower and do not sleep with pets.  Day of Surgery  Do not apply any lotions the morning of surgery.  Please wear clean clothes to the  hospital/surgery center.   

## 2013-07-12 ENCOUNTER — Ambulatory Visit (HOSPITAL_COMMUNITY)
Admission: RE | Admit: 2013-07-12 | Discharge: 2013-07-12 | Disposition: A | Discharge status: Home / Self Care | Payer: Medicare Other | Source: Ambulatory Visit | Attending: Anesthesiology | Admitting: Anesthesiology

## 2013-07-12 ENCOUNTER — Encounter (HOSPITAL_COMMUNITY): Payer: Self-pay

## 2013-07-12 ENCOUNTER — Encounter (HOSPITAL_COMMUNITY)
Admission: RE | Admit: 2013-07-12 | Discharge: 2013-07-12 | Disposition: A | Discharge status: Home / Self Care | Payer: Medicare Other | Source: Ambulatory Visit | Attending: Orthopedic Surgery | Admitting: Orthopedic Surgery

## 2013-07-12 DIAGNOSIS — Z01818 Encounter for other preprocedural examination: Secondary | ICD-10-CM | POA: Insufficient documentation

## 2013-07-12 DIAGNOSIS — Z01810 Encounter for preprocedural cardiovascular examination: Secondary | ICD-10-CM | POA: Insufficient documentation

## 2013-07-12 DIAGNOSIS — Z01812 Encounter for preprocedural laboratory examination: Secondary | ICD-10-CM | POA: Diagnosis not present

## 2013-07-12 DIAGNOSIS — J984 Other disorders of lung: Secondary | ICD-10-CM | POA: Diagnosis not present

## 2013-07-12 HISTORY — DX: Peripheral vascular disease, unspecified: I73.9

## 2013-07-12 HISTORY — DX: Unspecified asthma, uncomplicated: J45.909

## 2013-07-12 HISTORY — DX: Cardiac murmur, unspecified: R01.1

## 2013-07-12 HISTORY — DX: Cardiac arrhythmia, unspecified: I49.9

## 2013-07-12 HISTORY — DX: Unspecified osteoarthritis, unspecified site: M19.90

## 2013-07-12 HISTORY — DX: Headache: R51

## 2013-07-12 HISTORY — DX: Other seasonal allergic rhinitis: J30.2

## 2013-07-12 HISTORY — DX: Hypothyroidism, unspecified: E03.9

## 2013-07-12 LAB — ABO/RH: ABO/RH(D): O POS

## 2013-07-12 LAB — BASIC METABOLIC PANEL
BUN: 15 mg/dL (ref 6–23)
CALCIUM: 9.3 mg/dL (ref 8.4–10.5)
CO2: 22 mEq/L (ref 19–32)
CREATININE: 0.92 mg/dL (ref 0.50–1.10)
Chloride: 102 mEq/L (ref 96–112)
GFR calc Af Amer: 67 mL/min — ABNORMAL LOW (ref >90)
GFR, EST NON AFRICAN AMERICAN: 58 mL/min — AB (ref >90)
GLUCOSE: 109 mg/dL — AB (ref 70–99)
POTASSIUM: 4.1 meq/L (ref 3.7–5.3)
Sodium: 138 mEq/L (ref 137–147)

## 2013-07-12 LAB — CBC
HCT: 42.8 % (ref 36.0–46.0)
HEMOGLOBIN: 14.1 g/dL (ref 12.0–15.0)
MCH: 30.3 pg (ref 26.0–34.0)
MCHC: 32.9 g/dL (ref 30.0–36.0)
MCV: 92 fL (ref 78.0–100.0)
PLATELETS: 186 10*3/uL (ref 150–400)
RBC: 4.65 MIL/uL (ref 3.87–5.11)
RDW: 14 % (ref 11.5–15.5)
WBC: 7.8 10*3/uL (ref 4.0–10.5)

## 2013-07-12 LAB — TYPE AND SCREEN
ABO/RH(D): O POS
ANTIBODY SCREEN: NEGATIVE

## 2013-07-13 NOTE — Progress Notes (Addendum)
Anesthesia Chart Review:  Patient is a 80 year old female scheduled for left total shoulder arthroplasty on 07/20/13 by Dr. [name].  History includes former smoker, dysrhythmia (palpitations), murmur, PVD with history of right carotid endarterectomy '05, hypothyroidism, asthma, headaches, arthritis, gastric restriction surgery '06. PCP is Dr. Steva Ready Avva [phone]). Cardiologist is Dr. [name] who cleared patient for this procedure following a low risk stress test.    EKG on 07/12/13 showed NSR.  Nuclear stress test on 06/30/13 showed: Overall Impression: Low risk stress nuclear study, with likely artifact related lack of tracer uptake in the basal septal wall. Dr. [name] felt test was low risk.  Echo on 01/04/13 showed: Normal LV systolic function, EF 02-63%, no regional wall motion abnormalities, grade 1 diastolic dysfunction, aortic sclerosis without stenosis, trace to mild AR, mild to moderate MR, mild TR, PA peak pressure 33 mmHg   Carotid duplex on 12/28/12 showed: Mild plaque with no diameter reduction of bilateral ICA.  CXR on 07/12/13 showed: FINDINGS: Subchondral sclerosis and degenerative disease at the left glenohumeral joint. Lucency in the proximal right humerus most likely represents subchondral cyst formations. Heart and mediastinum are within normal limits. Small patchy densities at the left lung base could be related to overlying structures and vascular markings. This appears to be different from the prior examinations. Densities along the lower thoracic spine on the lateral view could represent degenerative bone changes. No suspicious airspace disease. IMPRESSION: Nonspecific patchy densities at the left lung base as described. This most likely represents overlying structures and possibly scarring. Recommend a short-term follow-up chest examination to ensure stability or even resolution. Degenerative changes in both shoulders.  Preoperative labs noted.   I left a voice message  with Sherri at Dr. Luanna Cole office to call me to discuss CXR results.  Findings should not interfere with proceeding to OR, but wanted to confirm plans for follow-up. (07/15/13 3:18 PM: I spoke with Sherri to confirm she had received message regarding CXR to forward to Dr. [name] for additional follow-up recommendations.)  George Hugh [facility] Short Stay Center/Anesthesiology Phone [phone] 07/13/2013 5:11 PM

## 2013-07-14 ENCOUNTER — Telehealth: Payer: Self-pay | Admitting: *Deleted

## 2013-07-14 NOTE — Telephone Encounter (Signed)
Returned cardiac clearance for shoulder surgery on 07/20/13 to be done by Dr. Marchia Bond.

## 2013-07-19 MED ORDER — CEFAZOLIN SODIUM-DEXTROSE 2-3 GM-% IV SOLR
2.0000 g | INTRAVENOUS | Status: AC
  Administered 2013-07-20: 2 g via INTRAVENOUS
  Filled 2013-07-19: qty 50

## 2013-07-20 ENCOUNTER — Encounter (HOSPITAL_COMMUNITY): Payer: Medicare Other | Admitting: Vascular Surgery

## 2013-07-20 ENCOUNTER — Encounter (HOSPITAL_COMMUNITY)
Admission: RE | Disposition: A | Discharge status: Home / Self Care | Payer: Self-pay | Source: Ambulatory Visit | Attending: Orthopedic Surgery

## 2013-07-20 ENCOUNTER — Inpatient Hospital Stay (HOSPITAL_COMMUNITY): Payer: Medicare Other | Admitting: Certified Registered Nurse Anesthetist

## 2013-07-20 ENCOUNTER — Encounter (HOSPITAL_COMMUNITY): Payer: Self-pay | Admitting: *Deleted

## 2013-07-20 ENCOUNTER — Inpatient Hospital Stay (HOSPITAL_COMMUNITY): Payer: Medicare Other

## 2013-07-20 ENCOUNTER — Inpatient Hospital Stay (HOSPITAL_COMMUNITY)
Admission: RE | Admit: 2013-07-20 | Discharge: 2013-07-21 | DRG: 483 | Disposition: A | Discharge status: Home / Self Care | Payer: Medicare Other | Source: Ambulatory Visit | Attending: Orthopedic Surgery | Admitting: Orthopedic Surgery

## 2013-07-20 DIAGNOSIS — J45909 Unspecified asthma, uncomplicated: Secondary | ICD-10-CM | POA: Diagnosis present

## 2013-07-20 DIAGNOSIS — Z7982 Long term (current) use of aspirin: Secondary | ICD-10-CM

## 2013-07-20 DIAGNOSIS — Z96619 Presence of unspecified artificial shoulder joint: Secondary | ICD-10-CM | POA: Diagnosis not present

## 2013-07-20 DIAGNOSIS — M19012 Primary osteoarthritis, left shoulder: Secondary | ICD-10-CM | POA: Diagnosis present

## 2013-07-20 DIAGNOSIS — Z79899 Other long term (current) drug therapy: Secondary | ICD-10-CM | POA: Diagnosis not present

## 2013-07-20 DIAGNOSIS — E039 Hypothyroidism, unspecified: Secondary | ICD-10-CM | POA: Diagnosis present

## 2013-07-20 DIAGNOSIS — I739 Peripheral vascular disease, unspecified: Secondary | ICD-10-CM | POA: Diagnosis not present

## 2013-07-20 DIAGNOSIS — M19019 Primary osteoarthritis, unspecified shoulder: Principal | ICD-10-CM | POA: Diagnosis present

## 2013-07-20 DIAGNOSIS — M259 Joint disorder, unspecified: Secondary | ICD-10-CM | POA: Diagnosis not present

## 2013-07-20 DIAGNOSIS — G8918 Other acute postprocedural pain: Secondary | ICD-10-CM | POA: Diagnosis not present

## 2013-07-20 HISTORY — PX: TOTAL SHOULDER ARTHROPLASTY: SHX126

## 2013-07-20 HISTORY — DX: Primary osteoarthritis, left shoulder: M19.012

## 2013-07-20 SURGERY — ARTHROPLASTY, SHOULDER, TOTAL
Anesthesia: Regional | Laterality: Left

## 2013-07-20 MED ORDER — GLYCOPYRROLATE 0.2 MG/ML IJ SOLN
INTRAMUSCULAR | Status: AC
  Filled 2013-07-20: qty 3

## 2013-07-20 MED ORDER — ONDANSETRON HCL 4 MG/2ML IJ SOLN
4.0000 mg | Freq: Four times a day (QID) | INTRAMUSCULAR | Status: AC | PRN
  Administered 2013-07-20: 4 mg via INTRAVENOUS

## 2013-07-20 MED ORDER — DOCUSATE SODIUM 100 MG PO CAPS
100.0000 mg | ORAL_CAPSULE | Freq: Two times a day (BID) | ORAL | Status: DC
  Administered 2013-07-20 – 2013-07-21 (×2): 100 mg via ORAL
  Filled 2013-07-20 (×3): qty 1

## 2013-07-20 MED ORDER — OXYCODONE HCL 5 MG PO TABS: 5.0000 mg | ORAL_TABLET | Freq: Once | ORAL | Status: DC | PRN

## 2013-07-20 MED ORDER — ONDANSETRON HCL 4 MG PO TABS: 4.0000 mg | ORAL_TABLET | Freq: Four times a day (QID) | ORAL | Status: DC | PRN

## 2013-07-20 MED ORDER — SENNA 8.6 MG PO TABS
1.0000 | ORAL_TABLET | Freq: Two times a day (BID) | ORAL | Status: DC
  Administered 2013-07-20 – 2013-07-21 (×2): 8.6 mg via ORAL
  Filled 2013-07-20 (×3): qty 1

## 2013-07-20 MED ORDER — PHENOL 1.4 % MT LIQD
1.0000 | OROMUCOSAL | Status: DC | PRN
  Administered 2013-07-20: 1 via OROMUCOSAL
  Filled 2013-07-20: qty 177

## 2013-07-20 MED ORDER — FENTANYL CITRATE 0.05 MG/ML IJ SOLN
INTRAMUSCULAR | Status: DC | PRN
  Administered 2013-07-20: 50 ug via INTRAVENOUS
  Administered 2013-07-20: 25 ug via INTRAVENOUS
  Administered 2013-07-20: 75 ug via INTRAVENOUS

## 2013-07-20 MED ORDER — LIDOCAINE HCL (CARDIAC) 20 MG/ML IV SOLN
INTRAVENOUS | Status: DC | PRN
  Administered 2013-07-20: 80 mg via INTRAVENOUS

## 2013-07-20 MED ORDER — METOCLOPRAMIDE HCL 10 MG PO TABS: 5.0000 mg | ORAL_TABLET | Freq: Three times a day (TID) | ORAL | Status: DC | PRN

## 2013-07-20 MED ORDER — ALBUTEROL SULFATE HFA 108 (90 BASE) MCG/ACT IN AERS: 1.0000 | INHALATION_SPRAY | Freq: Four times a day (QID) | RESPIRATORY_TRACT | Status: DC | PRN

## 2013-07-20 MED ORDER — ACETAMINOPHEN 325 MG PO TABS
650.0000 mg | ORAL_TABLET | Freq: Four times a day (QID) | ORAL | Status: DC | PRN
  Administered 2013-07-20: 650 mg via ORAL
  Filled 2013-07-20: qty 2

## 2013-07-20 MED ORDER — MENTHOL 3 MG MT LOZG: 1.0000 | LOZENGE | OROMUCOSAL | Status: DC | PRN

## 2013-07-20 MED ORDER — ONDANSETRON HCL 4 MG/2ML IJ SOLN
INTRAMUSCULAR | Status: AC
  Filled 2013-07-20: qty 2

## 2013-07-20 MED ORDER — LIDOCAINE HCL (CARDIAC) 20 MG/ML IV SOLN
INTRAVENOUS | Status: AC
  Filled 2013-07-20: qty 5

## 2013-07-20 MED ORDER — HYDROCODONE-ACETAMINOPHEN 10-325 MG PO TABS
1.0000 | ORAL_TABLET | ORAL | Status: DC | PRN
  Administered 2013-07-21 (×3): 1 via ORAL
  Filled 2013-07-20 (×3): qty 1
  Filled 2013-07-20: qty 2

## 2013-07-20 MED ORDER — PROPOFOL 10 MG/ML IV BOLUS
INTRAVENOUS | Status: AC
  Filled 2013-07-20: qty 20

## 2013-07-20 MED ORDER — STERILE WATER FOR INJECTION IJ SOLN
INTRAMUSCULAR | Status: AC
  Filled 2013-07-20: qty 10

## 2013-07-20 MED ORDER — ALPRAZOLAM 0.5 MG PO TABS: 0.5000 mg | ORAL_TABLET | Freq: Every evening | ORAL | Status: DC | PRN

## 2013-07-20 MED ORDER — VITAMIN D3 25 MCG (1000 UNIT) PO TABS
1000.0000 [IU] | ORAL_TABLET | Freq: Every day | ORAL | Status: DC
  Administered 2013-07-20 – 2013-07-21 (×2): 1000 [IU] via ORAL
  Filled 2013-07-20 (×2): qty 1

## 2013-07-20 MED ORDER — BUPIVACAINE-EPINEPHRINE (PF) 0.5% -1:200000 IJ SOLN
INTRAMUSCULAR | Status: DC | PRN
  Administered 2013-07-20: 30 mL

## 2013-07-20 MED ORDER — ROCURONIUM BROMIDE 50 MG/5ML IV SOLN
INTRAVENOUS | Status: AC
  Filled 2013-07-20: qty 1

## 2013-07-20 MED ORDER — GUAIFENESIN ER 600 MG PO TB12
1200.0000 mg | ORAL_TABLET | Freq: Two times a day (BID) | ORAL | Status: DC | PRN
  Filled 2013-07-20: qty 2

## 2013-07-20 MED ORDER — HYDROMORPHONE HCL PF 1 MG/ML IJ SOLN
0.2500 mg | INTRAMUSCULAR | Status: DC | PRN
  Administered 2013-07-20 (×2): 0.5 mg via INTRAVENOUS
  Administered 2013-07-20: 0.25 mg via INTRAVENOUS

## 2013-07-20 MED ORDER — ALUM & MAG HYDROXIDE-SIMETH 200-200-20 MG/5ML PO SUSP: 30.0000 mL | ORAL | Status: DC | PRN

## 2013-07-20 MED ORDER — SUCCINYLCHOLINE CHLORIDE 20 MG/ML IJ SOLN
INTRAMUSCULAR | Status: AC
  Filled 2013-07-20: qty 1

## 2013-07-20 MED ORDER — ONDANSETRON HCL 4 MG/2ML IJ SOLN
INTRAMUSCULAR | Status: DC | PRN
  Administered 2013-07-20: 4 mg via INTRAVENOUS

## 2013-07-20 MED ORDER — OXYCODONE HCL 5 MG/5ML PO SOLN: 5.0000 mg | Freq: Once | ORAL | Status: DC | PRN

## 2013-07-20 MED ORDER — POLYETHYLENE GLYCOL 3350 17 G PO PACK: 17.0000 g | PACK | Freq: Every day | ORAL | Status: DC | PRN

## 2013-07-20 MED ORDER — LACTATED RINGERS IV SOLN
INTRAVENOUS | Status: DC | PRN
  Administered 2013-07-20 (×2): via INTRAVENOUS

## 2013-07-20 MED ORDER — CALCIUM CARBONATE 600 MG PO TABS
600.0000 mg | ORAL_TABLET | Freq: Every day | ORAL | Status: DC
  Filled 2013-07-20: qty 1

## 2013-07-20 MED ORDER — METOCLOPRAMIDE HCL 5 MG/ML IJ SOLN
5.0000 mg | Freq: Three times a day (TID) | INTRAMUSCULAR | Status: DC | PRN
Start: 2013-07-20 — End: 2013-07-21

## 2013-07-20 MED ORDER — ALENDRONATE SODIUM 70 MG PO TABS: 70.0000 mg | ORAL_TABLET | ORAL | Status: DC

## 2013-07-20 MED ORDER — DIPHENHYDRAMINE HCL 12.5 MG/5ML PO ELIX: 12.5000 mg | ORAL_SOLUTION | ORAL | Status: DC | PRN

## 2013-07-20 MED ORDER — ONDANSETRON HCL 4 MG/2ML IJ SOLN: 4.0000 mg | Freq: Four times a day (QID) | INTRAMUSCULAR | Status: DC | PRN

## 2013-07-20 MED ORDER — NEOSTIGMINE METHYLSULFATE 10 MG/10ML IV SOLN
INTRAVENOUS | Status: DC | PRN
  Administered 2013-07-20: 4 mg via INTRAVENOUS

## 2013-07-20 MED ORDER — MIDAZOLAM HCL 2 MG/2ML IJ SOLN
INTRAMUSCULAR | Status: AC
  Filled 2013-07-20: qty 2

## 2013-07-20 MED ORDER — LORATADINE 10 MG PO TABS
10.0000 mg | ORAL_TABLET | Freq: Every day | ORAL | Status: DC
  Administered 2013-07-20 – 2013-07-21 (×2): 10 mg via ORAL
  Filled 2013-07-20 (×2): qty 1

## 2013-07-20 MED ORDER — ROCURONIUM BROMIDE 100 MG/10ML IV SOLN
INTRAVENOUS | Status: DC | PRN
  Administered 2013-07-20: 50 mg via INTRAVENOUS

## 2013-07-20 MED ORDER — NEBIVOLOL HCL 10 MG PO TABS
10.0000 mg | ORAL_TABLET | Freq: Every day | ORAL | Status: DC
  Administered 2013-07-21: 10 mg via ORAL
  Filled 2013-07-20: qty 1

## 2013-07-20 MED ORDER — METHOCARBAMOL 500 MG PO TABS
500.0000 mg | ORAL_TABLET | Freq: Four times a day (QID) | ORAL | Status: DC | PRN
  Administered 2013-07-21: 500 mg via ORAL
  Filled 2013-07-20 (×2): qty 1

## 2013-07-20 MED ORDER — ACETAMINOPHEN 650 MG RE SUPP: 650.0000 mg | Freq: Four times a day (QID) | RECTAL | Status: DC | PRN

## 2013-07-20 MED ORDER — ADULT MULTIVITAMIN W/MINERALS CH
1.0000 | ORAL_TABLET | Freq: Every day | ORAL | Status: DC
  Administered 2013-07-20 – 2013-07-21 (×2): 1 via ORAL
  Filled 2013-07-20 (×2): qty 1

## 2013-07-20 MED ORDER — BISACODYL 10 MG RE SUPP: 10.0000 mg | Freq: Every day | RECTAL | Status: DC | PRN

## 2013-07-20 MED ORDER — FENTANYL CITRATE 0.05 MG/ML IJ SOLN
INTRAMUSCULAR | Status: AC
  Filled 2013-07-20: qty 5

## 2013-07-20 MED ORDER — HYDROMORPHONE HCL PF 1 MG/ML IJ SOLN: 0.5000 mg | INTRAMUSCULAR | Status: DC | PRN

## 2013-07-20 MED ORDER — NEOSTIGMINE METHYLSULFATE 10 MG/10ML IV SOLN
INTRAVENOUS | Status: AC
  Filled 2013-07-20: qty 4

## 2013-07-20 MED ORDER — HYDROMORPHONE HCL PF 1 MG/ML IJ SOLN
INTRAMUSCULAR | Status: AC
  Filled 2013-07-20: qty 1

## 2013-07-20 MED ORDER — DEXTROSE 5 % IV SOLN
10.0000 mg | INTRAVENOUS | Status: DC | PRN
  Administered 2013-07-20: 20 ug/min via INTRAVENOUS

## 2013-07-20 MED ORDER — BUPIVACAINE HCL (PF) 0.25 % IJ SOLN
INTRAMUSCULAR | Status: AC
  Filled 2013-07-20: qty 30

## 2013-07-20 MED ORDER — HYDROCODONE-ACETAMINOPHEN 10-325 MG PO TABS: 1.0000 | ORAL_TABLET | Freq: Four times a day (QID) | ORAL | Status: DC | PRN

## 2013-07-20 MED ORDER — LEVOTHYROXINE SODIUM 125 MCG PO TABS
125.0000 ug | ORAL_TABLET | Freq: Every day | ORAL | Status: DC
  Administered 2013-07-21: 125 ug via ORAL
  Filled 2013-07-20 (×2): qty 1

## 2013-07-20 MED ORDER — CALCIUM CARBONATE 1250 (500 CA) MG PO TABS
1.0000 | ORAL_TABLET | Freq: Every day | ORAL | Status: DC
  Administered 2013-07-21: 500 mg via ORAL
  Filled 2013-07-20 (×2): qty 1

## 2013-07-20 MED ORDER — MIDAZOLAM HCL 5 MG/5ML IJ SOLN
INTRAMUSCULAR | Status: DC | PRN
  Administered 2013-07-20: 1 mg via INTRAVENOUS

## 2013-07-20 MED ORDER — DEXTROSE 5 % IV SOLN
500.0000 mg | Freq: Four times a day (QID) | INTRAVENOUS | Status: DC | PRN
  Filled 2013-07-20: qty 5

## 2013-07-20 MED ORDER — PROPOFOL 10 MG/ML IV BOLUS
INTRAVENOUS | Status: DC | PRN
  Administered 2013-07-20: 130 mg via INTRAVENOUS

## 2013-07-20 MED ORDER — EPHEDRINE SULFATE 50 MG/ML IJ SOLN
INTRAMUSCULAR | Status: AC
  Filled 2013-07-20: qty 1

## 2013-07-20 MED ORDER — POTASSIUM CHLORIDE IN NACL 20-0.45 MEQ/L-% IV SOLN
INTRAVENOUS | Status: DC
  Administered 2013-07-20: 75 mL/h via INTRAVENOUS
  Filled 2013-07-20 (×3): qty 1000

## 2013-07-20 MED ORDER — ALBUTEROL SULFATE (2.5 MG/3ML) 0.083% IN NEBU: 2.5000 mg | INHALATION_SOLUTION | Freq: Four times a day (QID) | RESPIRATORY_TRACT | Status: DC | PRN

## 2013-07-20 MED ORDER — FLUTICASONE PROPIONATE 50 MCG/ACT NA SUSP
1.0000 | Freq: Every day | NASAL | Status: DC
  Administered 2013-07-20 – 2013-07-21 (×2): 1 via NASAL
  Filled 2013-07-20: qty 16

## 2013-07-20 MED ORDER — ASPIRIN EC 81 MG PO TBEC
81.0000 mg | DELAYED_RELEASE_TABLET | Freq: Every day | ORAL | Status: DC
  Administered 2013-07-20 – 2013-07-21 (×2): 81 mg via ORAL
  Filled 2013-07-20 (×2): qty 1

## 2013-07-20 MED ORDER — GLYCOPYRROLATE 0.2 MG/ML IJ SOLN
INTRAMUSCULAR | Status: DC | PRN
  Administered 2013-07-20: 0.6 mg via INTRAVENOUS

## 2013-07-20 MED ORDER — CEFAZOLIN SODIUM-DEXTROSE 2-3 GM-% IV SOLR
2.0000 g | Freq: Four times a day (QID) | INTRAVENOUS | Status: AC
  Administered 2013-07-20 – 2013-07-21 (×3): 2 g via INTRAVENOUS
  Filled 2013-07-20 (×5): qty 50

## 2013-07-20 SURGICAL SUPPLY — 68 items
BENZOIN TINCTURE PRP APPL 2/3 (GAUZE/BANDAGES/DRESSINGS) ×3 IMPLANT
BLADE 10 SAFETY STRL DISP (BLADE) ×3 IMPLANT
BLADE SAW SGTL MED 73X18.5 STR (BLADE) ×3 IMPLANT
BOOTCOVER CLEANROOM LRG (PROTECTIVE WEAR) ×6 IMPLANT
BOWL SMART MIX CTS (DISPOSABLE) ×3 IMPLANT
BRUSH FEMORAL CANAL (MISCELLANEOUS) ×3 IMPLANT
CAP TOTAL SHOULDER ×3 IMPLANT
CEMENT BONE DEPUY (Cement) ×3 IMPLANT
CLOSURE STERI-STRIP 1/2X4 (GAUZE/BANDAGES/DRESSINGS) ×1
CLSR STERI-STRIP ANTIMIC 1/2X4 (GAUZE/BANDAGES/DRESSINGS) ×2 IMPLANT
COVER SURGICAL LIGHT HANDLE (MISCELLANEOUS) ×3 IMPLANT
COVER TABLE BACK 60X90 (DRAPES) ×3 IMPLANT
DRAPE C-ARM 42X72 X-RAY (DRAPES) IMPLANT
DRAPE INCISE IOBAN 66X45 STRL (DRAPES) ×3 IMPLANT
DRAPE U-SHAPE 47X51 STRL (DRAPES) ×3 IMPLANT
DRSG MEPILEX BORDER 4X8 (GAUZE/BANDAGES/DRESSINGS) ×3 IMPLANT
DRSG PAD ABDOMINAL 8X10 ST (GAUZE/BANDAGES/DRESSINGS) ×3 IMPLANT
DURAPREP 26ML APPLICATOR (WOUND CARE) ×3 IMPLANT
ELECT BLADE 6.5 EXT (BLADE) IMPLANT
ELECT NEEDLE TIP 2.8 STRL (NEEDLE) IMPLANT
ELECT REM PT RETURN 9FT ADLT (ELECTROSURGICAL) ×3
ELECTRODE REM PT RTRN 9FT ADLT (ELECTROSURGICAL) ×1 IMPLANT
EVACUATOR 1/8 PVC DRAIN (DRAIN) IMPLANT
FACESHIELD WRAPAROUND (MASK) IMPLANT
GLOVE BIOGEL PI ORTHO PRO SZ8 (GLOVE) ×4
GLOVE ORTHO TXT STRL SZ7.5 (GLOVE) ×6 IMPLANT
GLOVE PI ORTHO PRO STRL SZ8 (GLOVE) ×2 IMPLANT
GLOVE SURG ORTHO 8.0 STRL STRW (GLOVE) ×6 IMPLANT
GOWN BRE IMP PREV XXLGXLNG (GOWN DISPOSABLE) ×3 IMPLANT
GOWN STRL REUS W/ TWL XL LVL3 (GOWN DISPOSABLE) IMPLANT
GOWN STRL REUS W/TWL XL LVL3 (GOWN DISPOSABLE)
HANDPIECE INTERPULSE COAX TIP (DISPOSABLE) ×2
HOOD PEEL AWAY FACE SHEILD DIS (HOOD) ×6 IMPLANT
KIT BASIN OR (CUSTOM PROCEDURE TRAY) ×3 IMPLANT
KIT ROOM TURNOVER OR (KITS) ×3 IMPLANT
MANIFOLD NEPTUNE II (INSTRUMENTS) ×3 IMPLANT
NEEDLE 1/2 CIR CATGUT .05X1.09 (NEEDLE) ×3 IMPLANT
NEEDLE HYPO 25GX1X1/2 BEV (NEEDLE) ×3 IMPLANT
NS IRRIG 1000ML POUR BTL (IV SOLUTION) ×3 IMPLANT
PACK SHOULDER (CUSTOM PROCEDURE TRAY) ×3 IMPLANT
PAD ABD 8X10 STRL (GAUZE/BANDAGES/DRESSINGS) ×3 IMPLANT
PAD ARMBOARD 7.5X6 YLW CONV (MISCELLANEOUS) ×6 IMPLANT
PIN STEINMANN THREADED TIP (PIN) ×3 IMPLANT
SET HNDPC FAN SPRY TIP SCT (DISPOSABLE) ×1 IMPLANT
SLING ARM IMMOBILIZER LRG (SOFTGOODS) IMPLANT
SLING ARM IMMOBILIZER MED (SOFTGOODS) IMPLANT
SMARTMIX MINI TOWER (MISCELLANEOUS) ×3
SPONGE GAUZE 4X4 12PLY (GAUZE/BANDAGES/DRESSINGS) ×3 IMPLANT
SPONGE GAUZE 4X4 12PLY STER LF (GAUZE/BANDAGES/DRESSINGS) ×3 IMPLANT
SPONGE LAP 18X18 X RAY DECT (DISPOSABLE) ×3 IMPLANT
SUCTION FRAZIER TIP 10 FR DISP (SUCTIONS) ×3 IMPLANT
SUPPORT WRAP ARM LG (MISCELLANEOUS) ×3 IMPLANT
SUT FIBERWIRE #2 38 REV NDL BL (SUTURE) ×15
SUT MAXBRAID (SUTURE) ×6 IMPLANT
SUT MNCRL AB 4-0 PS2 18 (SUTURE) ×3 IMPLANT
SUT VIC AB 0 CT1 27 (SUTURE) ×2
SUT VIC AB 0 CT1 27XBRD ANBCTR (SUTURE) ×1 IMPLANT
SUT VIC AB 2-0 CT1 27 (SUTURE) ×2
SUT VIC AB 2-0 CT1 TAPERPNT 27 (SUTURE) ×1 IMPLANT
SUT VIC AB 3-0 SH 8-18 (SUTURE) ×3 IMPLANT
SUTURE FIBERWR#2 38 REV NDL BL (SUTURE) ×5 IMPLANT
SYR CONTROL 10ML LL (SYRINGE) ×3 IMPLANT
TAPE CLOTH SURG 4X10 WHT LF (GAUZE/BANDAGES/DRESSINGS) ×3 IMPLANT
TOWEL OR 17X24 6PK STRL BLUE (TOWEL DISPOSABLE) ×3 IMPLANT
TOWEL OR 17X26 10 PK STRL BLUE (TOWEL DISPOSABLE) ×3 IMPLANT
TOWER SMARTMIX MINI (MISCELLANEOUS) ×1 IMPLANT
TRAY FOLEY CATH 16FRSI W/METER (SET/KITS/TRAYS/PACK) IMPLANT
WATER STERILE IRR 1000ML POUR (IV SOLUTION) ×3 IMPLANT

## 2013-07-20 NOTE — Anesthesia Postprocedure Evaluation (Signed)
Anesthesia Post Note  Patient: Danielle [name]  Procedure(s) Performed: Procedure(s) (LRB): TOTAL SHOULDER ARTHROPLASTY (Left)  Anesthesia type: General  Patient location: PACU  Post pain: Pain level controlled and Adequate analgesia  Post assessment: Post-op Vital signs reviewed, Patient's Cardiovascular Status Stable, Respiratory Function Stable, Patent Airway and Pain level controlled  Last Vitals:  Filed Vitals:   07/20/13 1031  BP: 109/47  Pulse: 69  Temp:   Resp: 13    Post vital signs: Reviewed and stable  Level of consciousness: awake, alert  and oriented  Complications: No apparent anesthesia complications

## 2013-07-20 NOTE — Transfer of Care (Signed)
Immediate Anesthesia Transfer of Care Note  Patient: Danielle [name]  Procedure(s) Performed: Procedure(s): TOTAL SHOULDER ARTHROPLASTY (Left)  Patient Location: PACU  Anesthesia Type:General and Regional  Level of Consciousness: awake and alert   Airway & Oxygen Therapy: Patient Spontanous Breathing and Patient connected to nasal cannula oxygen  Post-op Assessment: Report given to PACU RN, Post -op Vital signs reviewed and stable and Patient moving all extremities X 4  Post vital signs: Reviewed and stable  Complications: No apparent anesthesia complications

## 2013-07-20 NOTE — Progress Notes (Signed)
Utilization review completed.  

## 2013-07-20 NOTE — Discharge Instructions (Signed)
Diet: As you were doing prior to hospitalization  ° °Shower:  May shower but keep the wounds dry, use an occlusive plastic wrap, NO SOAKING IN TUB.  If the bandage gets wet, change with a clean dry gauze. ° °Dressing:  You may change your dressing 3-5 days after surgery.  Then change the dressing daily with sterile gauze dressing.   ° °There are sticky tapes (steri-strips) on your wounds and all the stitches are absorbable.  Leave the steri-strips in place when changing your dressings, they will peel off with time, usually 2-3 weeks. ° °Activity:  Increase activity slowly as tolerated, but follow the weight bearing instructions below.  No lifting or driving for 6 weeks. ° °Weight Bearing:   Sling at all times..   ° °To prevent constipation: you may use a stool softener such as - ° °Colace (over the counter) 100 mg by mouth twice a day  °Drink plenty of fluids (prune juice may be helpful) and high fiber foods °Miralax (over the counter) for constipation as needed.   ° °Itching:  If you experience itching with your medications, try taking only a single pain pill, or even half a pain pill at a time.  You may take up to 10 pain pills per day, and you can also use benadryl over the counter for itching or also to help with sleep.  ° °Precautions:  If you experience chest pain or shortness of breath - call 911 immediately for transfer to the hospital emergency department!! ° °If you develop a fever greater that 101 F, purulent drainage from wound, increased redness or drainage from wound, or calf pain -- Call the office at [phone]                                                °Follow- Up Appointment:  Please call for an appointment to be seen in 2 weeks [place] - [phone] ° ° ° ° ° °

## 2013-07-20 NOTE — Op Note (Signed)
07/20/2013  9:54 AM  PATIENT:  Danielle [name]    PRE-OPERATIVE DIAGNOSIS:  DJD LEFT SHOULDER  POST-OPERATIVE DIAGNOSIS:  Same  PROCEDURE:  TOTAL SHOULDER ARTHROPLASTY  SURGEON:  [name], MD  PHYSICIAN ASSISTANT: Joya Gaskins, OPA-C, present and scrubbed throughout the case, critical for completion in a timely fashion, and for retraction, instrumentation, and closure.  ANESTHESIA:   General  PREOPERATIVE INDICATIONS:  Danielle [name] is a  80 y.o. female with a diagnosis of DJD LEFT SHOULDER who failed conservative measures and elected for surgical management.    The risks benefits and alternatives were discussed with the patient preoperatively including but not limited to the risks of infection, bleeding, nerve injury, cardiopulmonary complications, the need for revision surgery, dislocation, loosening, incomplete relief of pain, among others, and the patient was willing to proceed.   OPERATIVE IMPLANTS: Biomet size 11 mini press-fit humeral stem, size 42+18 Versa-dial humeral head, set in the E position with increased coverage superiorly, with a small cemented glenoid polyethylene 3 peg implant with a central regenerex noncemented post.   OPERATIVE FINDINGS: Advanced glenohumeral osteoarthritis involving the glenoid and the humeral head with substantial osteophyte formation inferiorly.   OPERATIVE PROCEDURE: The patient was brought to the operating room and placed in the supine position. General anesthesia was administered. IV antibiotics were given.  The upper extremity was prepped and draped in usual sterile fashion. The patient was in a beachchair position with all bony prominences padded.   Time out was performed and a deltopectoral approach was carried out. The biceps tendon was tenodesed to the pectoralis tendon. The subscapularis was released, tagging it with a #2 MaxBraid, leaving a cuff of tendon for repair.   The inferior osteophyte was removed, and release of the  capsule off of the humeral side was completed. The head was dislocated, and I reamed sequentially. I placed the humeral cutting guide at 30 of retroversion, and then pinned this into place, and made my humeral neck cut. My initial cut was slightly high, and I made a second cut was at the appropriate level, and provided access to the glenoid.   I then placed deep retractors and exposed the glenoid. I excised the labrum circumferentially, taking care to protect the axillary nerve inferiorly.   I then placed a guidewire into the center position, controlling appropriate version and inclination. I then reamed over the guidewire with the small reamer, and was satisfied with the preparation. After preparation however, I removed more anterior bone, based on her preoperative CAT scan, as intended, however the guide pin was now much more anterior than I liked, and so I removed the guide pin, reinserted it slightly posterior to the central within the vault and on the face, and then touched the face again with another reamer, but this allowed for all 3 pegs to be within bone anteriorly. I preserved the subchondral bone in order to maximize the strength and minimize the risk for subsequent subsidence.   I then drilled the central hole for the regenerex peg, and then placed the guide, and then drilled the 3 peripheral peg holes. I had excellent bony circumferential contact.   I then cleaned the glenoid, irrigated it copiously, and then dried it and cemented the prosthesis into place. Excellent seating was achieved. Interestingly, each of the holes leaked cement into the central hole, which I suspect was because of her overall mediocre bone density. I had full exposure. The cement cured, and then I turned my attention to  the humeral side.   I sequentially broached, up to the selected size, although I had initially only reamed up to a size 10, but the 10 broach did not have adequate hold, and I went to an 11, with the  broach set at 30 of retroversion. I then placed the real stem. I trialed with multiple heads, and the above-named component was selected. Increased superior coverage improved the coverage. The soft tissue tension was appropriate.   I then impacted the real humeral head into place, reduced the head, and irrigated copiously. Excellent stability and range of motion was achieved. I repaired the subscapularis with 4 #2 MaxBraid, as well as the rotator interval, and irrigated copiously once more. The subcutaneous tissue was closed with Vicryl including the deltopectoral fascia.   The skin was closed with Steri-Strips and sterile gauze was applied. She had a preoperative nerve block. She tolerated the procedure well and there were no complications.

## 2013-07-20 NOTE — Anesthesia Procedure Notes (Addendum)
Procedure Name: Intubation Date/Time: 07/20/2013 7:40 AM Performed by: Blair Heys E Pre-anesthesia Checklist: Patient identified, Emergency Drugs available, Suction available and Patient being monitored Patient Re-evaluated:Patient Re-evaluated prior to inductionOxygen Delivery Method: Circle system utilized Preoxygenation: Pre-oxygenation with 100% oxygen Intubation Type: IV induction Ventilation: Mask ventilation without difficulty Laryngoscope Size: Miller and 2 Grade View: Grade I Tube type: Oral Tube size: 7.5 mm Number of attempts: 1 Placement Confirmation: ETT inserted through vocal cords under direct vision,  positive ETCO2 and breath sounds checked- equal and bilateral Secured at: 22 cm Tube secured with: Tape Dental Injury: Teeth and Oropharynx as per pre-operative assessment    Anesthesia Regional Block:  Interscalene brachial plexus block  Pre-Anesthetic Checklist: ,, timeout performed, Correct Patient, Correct Site, Correct Laterality, Correct Procedure, Correct Position, site marked, Risks and benefits discussed,  Surgical consent,  Pre-op evaluation,  At surgeon's request and post-op pain management  Laterality: Left  Prep: chloraprep       Needles:  Injection technique: Single-shot  Needle Type: Echogenic Stimulator Needle     Needle Length: 5cm 5 cm Needle Gauge: 22 and 22 G    Additional Needles:  Procedures: ultrasound guided (picture in chart) and nerve stimulator Interscalene brachial plexus block  Nerve Stimulator or Paresthesia:  Response: biceps flexion, 0.45 mA,   Additional Responses:   Narrative:  Start time: 07/20/2013 7:11 AM End time: 07/20/2013 7:22 AM Injection made incrementally with aspirations every 5 mL.  Performed by: Personally  Anesthesiologist: Dr Marcie Bal  Additional Notes: Functioning IV was confirmed and monitors were applied.  A 30mm 22ga Arrow echogenic stimulator needle was used. Sterile prep and drape,hand hygiene  and sterile gloves were used.  Negative aspiration and negative test dose prior to incremental administration of local anesthetic. The patient tolerated the procedure well.  Ultrasound guidance: relevent anatomy identified, needle position confirmed, local anesthetic spread visualized around nerve(s), vascular puncture avoided.  Image printed for medical record.

## 2013-07-20 NOTE — H&P (Signed)
PREOPERATIVE H&P  Chief Complaint: DJD LEFT SHOULDER  HPI: Danielle [name] is a 80 y.o. female who presents for preoperative history and physical with a diagnosis of DJD LEFT SHOULDER. Symptoms are rated as moderate to severe, and have been worsening.  This is significantly impairing activities of daily living.  She has elected for surgical management. She has failed injections, NSAIDS, activity modification. XR w/ end stage DJD left shoulder.  Past Medical History  Diagnosis Date  . Dysrhythmia   . Heart murmur   . Peripheral vascular disease     carotid   . Asthma   . Seasonal allergies   . Hypothyroidism   . Headache(784.0)   . Arthritis    Past Surgical History  Procedure Laterality Date  . Carotid endarterectomy Right   . Gastric restriction surgery Left 2006  . Tonsillectomy    . Colon surgery    . Eye surgery Bilateral     cataracts   . Colonoscopy     History   Social History  . Marital Status: Married    Spouse Name: N/A    Number of Children: N/A  . Years of Education: N/A   Social History Main Topics  . Smoking status: Former Smoker -- 0.25 packs/day for 2 years    Types: Cigarettes    Quit date: 07/13/1963  . Smokeless tobacco: Never Used     Comment: quit aprrox 50 years ago.  . Alcohol Use: No  . Drug Use: No  . Sexual Activity: None   Other Topics Concern  . None   Social History Narrative  . None   History reviewed. No pertinent family history. No Known Allergies Prior to Admission medications   Medication Sig Start Date End Date Taking? Authorizing Provider  albuterol (PROVENTIL HFA;VENTOLIN HFA) 108 (90 BASE) MCG/ACT inhaler Inhale 1-2 puffs into the lungs every 6 (six) hours as needed for wheezing or shortness of breath.   Yes Historical Provider, MD  alendronate (FOSAMAX) 70 MG tablet Take 70 mg by mouth once a week. On Sunday 09/07/12  Yes Historical Provider, MD  aspirin EC 81 MG tablet Take 81 mg by mouth daily.   Yes Historical  Provider, MD  calcium carbonate (OS-CAL) 600 MG TABS tablet Take 600 mg by mouth daily with breakfast.    Yes Historical Provider, MD  cetirizine (ZYRTEC) 10 MG tablet Take 10 mg by mouth daily.   Yes Historical Provider, MD  cholecalciferol (VITAMIN D) 1000 UNITS tablet Take 1,000 Units by mouth daily.    Yes Historical Provider, MD  fluticasone (FLONASE) 50 MCG/ACT nasal spray Place 1 spray into both nostrils daily. 04/28/13  Yes Historical Provider, MD  guaiFENesin (MUCINEX) 600 MG 12 hr tablet Take 1,200 mg by mouth 2 (two) times daily as needed for to loosen phlegm.    Yes Historical Provider, MD  levothyroxine (SYNTHROID, LEVOTHROID) 125 MCG tablet Take 125 mcg by mouth daily before breakfast.   Yes Historical Provider, MD  Multiple Vitamin (MULTIVITAMIN WITH MINERALS) TABS tablet Take 1 tablet by mouth daily.   Yes Historical Provider, MD  nebivolol (BYSTOLIC) 10 MG tablet Take 10 mg by mouth daily.   Yes Historical Provider, MD  OVER THE COUNTER MEDICATION Take 1 capsule by mouth daily. Mega red-3   Yes Historical Provider, MD  polyethylene glycol (MIRALAX / GLYCOLAX) packet Take 17 g by mouth daily as needed for moderate constipation.   Yes Historical Provider, MD  ALPRAZolam Duanne Moron) 0.5 MG tablet Take 0.5 mg  by mouth at bedtime as needed for sleep.     Historical Provider, MD     Positive ROS: All other systems have been reviewed and were otherwise negative with the exception of those mentioned in the HPI and as above.  Physical Exam: General: Alert, no acute distress Cardiovascular: No pedal edema Respiratory: No cyanosis, no use of accessory musculature GI: No organomegaly, abdomen is soft and non-tender Skin: No lesions in the area of chief complaint Neurologic: Sensation intact distally Psychiatric: Patient is competent for consent with normal mood and affect Lymphatic: No axillary or cervical lymphadenopathy  MUSCULOSKELETAL: left shoulder 0-80deg, ER to neutral at best.   Cuff intact.    Assessment: DJD LEFT SHOULDER  Plan: Plan for Procedure(s): TOTAL SHOULDER ARTHROPLASTY  The risks benefits and alternatives were discussed with the patient including but not limited to the risks of nonoperative treatment, versus surgical intervention including infection, bleeding, nerve injury,  blood clots, cardiopulmonary complications, morbidity, mortality, among others, and they were willing to proceed.   [name], MD Cell [phone]   07/20/2013 7:13 AM

## 2013-07-20 NOTE — Anesthesia Preprocedure Evaluation (Addendum)
Anesthesia Evaluation  Patient identified by MRN, date of birth, ID band Patient awake    Reviewed: Allergy & Precautions, H&P , NPO status , Patient's Chart, lab work & pertinent test results, reviewed documented beta blocker date and time   Airway Mallampati: II TM Distance: >3 FB Neck ROM: full    Dental  (+) Dental Advisory Given, Edentulous Upper, Edentulous Lower   Pulmonary asthma , former smoker,          Cardiovascular hypertension, Pt. on home beta blockers + Peripheral Vascular Disease     Neuro/Psych  Headaches,    GI/Hepatic   Endo/Other  Hypothyroidism   Renal/GU      Musculoskeletal  (+) Arthritis -,   Abdominal   Peds  Hematology   Anesthesia Other Findings   Reproductive/Obstetrics                         Anesthesia Physical Anesthesia Plan  ASA: II  Anesthesia Plan: General and Regional   Post-op Pain Management: MAC Combined w/ Regional for Post-op pain   Induction: Intravenous  Airway Management Planned: Oral ETT  Additional Equipment:   Intra-op Plan:   Post-operative Plan: Extubation in OR  Informed Consent: I have reviewed the patients History and Physical, chart, labs and discussed the procedure including the risks, benefits and alternatives for the proposed anesthesia with the patient or authorized representative who has indicated his/her understanding and acceptance.   Dental advisory given  Plan Discussed with: CRNA, Anesthesiologist and Surgeon  Anesthesia Plan Comments:        Anesthesia Quick Evaluation

## 2013-07-21 ENCOUNTER — Encounter (HOSPITAL_COMMUNITY): Payer: Self-pay | Admitting: Orthopedic Surgery

## 2013-07-21 LAB — CBC
HCT: 35.4 % — ABNORMAL LOW (ref 36.0–46.0)
HEMOGLOBIN: 11.8 g/dL — AB (ref 12.0–15.0)
MCH: 30.2 pg (ref 26.0–34.0)
MCHC: 33.3 g/dL (ref 30.0–36.0)
MCV: 90.5 fL (ref 78.0–100.0)
Platelets: 160 10*3/uL (ref 150–400)
RBC: 3.91 MIL/uL (ref 3.87–5.11)
RDW: 14.1 % (ref 11.5–15.5)
WBC: 7.5 10*3/uL (ref 4.0–10.5)

## 2013-07-21 LAB — BASIC METABOLIC PANEL
BUN: 14 mg/dL (ref 6–23)
CALCIUM: 8.6 mg/dL (ref 8.4–10.5)
CO2: 22 mEq/L (ref 19–32)
CREATININE: 0.91 mg/dL (ref 0.50–1.10)
Chloride: 100 mEq/L (ref 96–112)
GFR calc Af Amer: 68 mL/min — ABNORMAL LOW (ref >90)
GFR calc non Af Amer: 59 mL/min — ABNORMAL LOW (ref >90)
GLUCOSE: 116 mg/dL — AB (ref 70–99)
Potassium: 4.3 mEq/L (ref 3.7–5.3)
Sodium: 135 mEq/L — ABNORMAL LOW (ref 137–147)

## 2013-07-21 NOTE — Progress Notes (Signed)
Pt discharged to home. D/c instructions given, no questions verbalized. Vitals stable. 

## 2013-07-21 NOTE — Progress Notes (Addendum)
Patient ID: KYLYNN STREET, female   DOB: 12-17-1932, 80 y.o.   MRN: [id number]     Subjective:  Patient reports pain as mild to moderate.  Patient is very frustrated about the care and would like to go home.  Objective:   VITALS:   Filed Vitals:   07/20/13 1615 07/20/13 2200 07/21/13 0149 07/21/13 0525  BP:  128/75 131/68 108/51  Pulse:  85 74 81  Temp:  98.4 F (36.9 C) 98.8 F (37.1 C) 99.4 F (37.4 C)  TempSrc:  Oral Oral Oral  Resp:  16 18 18   Height: 5\' 7"  (1.702 m)     Weight: 79.833 kg (176 lb)     SpO2:  98% 97% 98%    ABD soft Sensation intact distally Dorsiflexion/Plantar flexion intact Incision: dressing C/D/I and no drainage Patient able to flex, extend, and abduct all fingers.  She also has full wrist motion  Lab Results  Component Value Date   WBC 7.5 07/21/2013   HGB 11.8* 07/21/2013   HCT 35.4* 07/21/2013   MCV 90.5 07/21/2013   PLT 160 07/21/2013     Assessment/Plan: 1 Day Post-Op   Principal Problem:   Osteoarthritis of left shoulder Active Problems:   Primary localized osteoarthrosis, shoulder region   Advance diet Up with therapy Plan for DC home Sling at all times NWB left upper ext Dry dressing PRN   Joya Gaskins 07/21/2013, 8:02 AM  Discussed and agree with above.  Marchia Bond, MD Cell [phone]

## 2013-07-21 NOTE — Care Management Note (Signed)
CARE MANAGEMENT NOTE 07/21/2013  Patient:  Danielle [name], Danielle [name]   Account Number:  [id number]  Date Initiated:  07/21/2013  Documentation initiated by:  Ricki Miller  Subjective/Objective Assessment:   80 yr old female s/p left total shoulder arthroplasty.     Action/Plan:   Patient will discharge home, no home health needs identified by PT or OT.   Anticipated DC Date:  07/21/2013   Anticipated DC Plan:  [place]  CM consult      PAC Choice  NA   Choice offered to / List presented to:     DME arranged  NA        HH arranged  NA      Status of service:  Completed, signed off Medicare Important Message given?  YES (If response is "NO", the following Medicare IM given date fields will be blank) Date Medicare IM given:  07/21/2013 Date Additional Medicare IM given:    Discharge Disposition:  HOME/SELF CARE  Per UR Regulation:  Reviewed for med. necessity/level of care/duration of stay

## 2013-07-21 NOTE — Evaluation (Signed)
Occupational Therapy Evaluation Patient Details Name: Danielle [name] MRN: [id number] DOB: [birth year] Today's Date: 07/21/2013    History of Present Illness s/p L total shoulder arthoplasty   Clinical Impression   Pt seen for acute OT evaluation. Pt provided OT shoulder protocol handout; reviewed with pt. Pt performed AROM exercises of neck, elbow, wrist and hand. Education on ADLs and sling provided with spouse present for session. Pt cued to not use LUE during sit>stand; reinforced this later in session as well. Educated and reinforced NWB, no AROM status of LUE.    Follow Up Recommendations  No OT follow up    Equipment Recommendations  None recommended by OT    Recommendations for Other Services       Precautions / Restrictions Precautions Precautions: Shoulder Type of Shoulder Precautions: non WBAT, no pendulums, no AROM L shoulder Shoulder Interventions: Shoulder sling/immobilizer;Off for dressing/bathing/exercises Precaution Booklet Issued: Yes (comment) Precaution Comments: reviewed OT shoulder protocol handout  Required Braces or Orthoses: Sling Restrictions Weight Bearing Restrictions: Yes LUE Weight Bearing: Non weight bearing Other Position/Activity Restrictions: ok to hold light items in L hand       Mobility Bed Mobility               General bed mobility comments: EOB at start of session  Transfers Overall transfer level: Needs assistance   Transfers: Sit to/from Stand Sit to Stand: Min guard;From elevated surface         General transfer comment: cues to not use LUE to assist with sit>stand; reinforced later in session as well    Balance Overall balance assessment: Needs assistance Sitting-balance support: Feet supported Sitting balance-Leahy Scale: Good     Standing balance support: During functional activity Standing balance-Leahy Scale: Good Standing balance comment: educated on having spouse nearby for higher level balance activities  in standing such as donning/doffing LB dressing.                            ADL Overall ADL's : Needs assistance/impaired Eating/Feeding: Set up;Sitting   Grooming: Set up;Standing   Upper Body Bathing: Min guard;Sitting;Cueing for UE precautions   Lower Body Bathing: Minimal assistance;Sit to/from stand   Upper Body Dressing : Minimal assistance;Sitting;Cueing for UE precautions   Lower Body Dressing: Minimal assistance;Sit to/from stand Lower Body Dressing Details (indicate cue type and reason): assistance for fasteners Toilet Transfer: Min guard;Comfort height toilet;Ambulation   Toileting- Clothing Manipulation and Hygiene: Minimal assistance;Sit to/from stand Toileting - Clothing Manipulation Details (indicate cue type and reason): assistance for fasteners Tub/ Shower Transfer: Min guard;Ambulation;Shower seat   Functional mobility during ADLs: Min guard General ADL Comments: Educated on techniques for safe completion of ADLs with shoulder precautions. Pt able to correctly state dressing technique, NWB of LUE. Cued to not use LUE to assist assist with sit>stand.     Vision                     Perception     Praxis      Pertinent Vitals/Pain 7/10 pain LUE. Nursing notified of pt request for pain meds.     Hand Dominance     Extremity/Trunk Assessment Upper Extremity Assessment Upper Extremity Assessment: LUE deficits/detail LUE Deficits / Details: s/p total shoulder arthoplasty   Lower Extremity Assessment Lower Extremity Assessment: Defer to PT evaluation;Overall [facility] for tasks assessed       Communication Communication Communication: No difficulties   Cognition Arousal/Alertness:  Awake/alert Behavior During Therapy: WFL for tasks assessed/performed Overall Cognitive Status: Within Functional Limits for tasks assessed                     General Comments       Exercises Exercises: Shoulder     Shoulder Instructions  Shoulder Instructions Donning/doffing shirt without moving shoulder: Minimal assistance (wide neck short sleeve shirt, educated on button up shirts) Method for sponge bathing under operated UE: Min-guard Donning/doffing sling/immobilizer: Minimal assistance Correct positioning of sling/immobilizer: Minimal assistance ROM for elbow, wrist and digits of operated UE: Supervision/safety Sling wearing schedule (on at all times/off for ADL's): Independent Proper positioning of operated UE when showering: Supervision/safety Positioning of UE while sleeping: [place] expects to be discharged to:: Private residence Living Arrangements: Spouse/significant other Available Help at Discharge: Family;Available 24 hours/day Type of Home: House                                  Prior Functioning/Environment Level of Independence: Independent             OT Diagnosis:     OT Problem List:     OT Treatment/Interventions:      OT Goals(Current goals can be found in the care plan section) Acute Rehab OT Goals Patient Stated Goal: not stated  OT Frequency:     Barriers to D/C:            Co-evaluation              End of Session Equipment Utilized During Treatment: Other (comment) (sling) Nurse Communication: Patient requests pain meds  Activity Tolerance: Patient tolerated treatment well;Patient limited by pain Patient left: in bed;with call bell/phone within reach;with family/visitor present   Time: 6294-7654 OT Time Calculation (min): 39 min Charges:  OT General Charges $OT Visit: 1 Procedure OT Evaluation $Initial OT Evaluation Tier I: 1 Procedure OT Treatments $Self Care/Home Management : 8-22 mins $Therapeutic Exercise: 8-22 mins G-Codes:    Hortencia Pilar 08/18/2013, 10:09 AM

## 2013-07-21 NOTE — Discharge Summary (Signed)
Physician Discharge Summary  Patient ID: Danielle [name] MRN: [id number] DOB/AGE: October 31, 1932 79 y.o.  Admit date: 07/20/2013 Discharge date: 07/21/2013  Admission Diagnoses:  Osteoarthritis of left shoulder  Discharge Diagnoses:  Principal Problem:   Osteoarthritis of left shoulder Active Problems:   Primary localized osteoarthrosis, shoulder region   Past Medical History  Diagnosis Date  . Dysrhythmia   . Heart murmur   . Peripheral vascular disease     carotid   . Asthma   . Seasonal allergies   . Hypothyroidism   . Headache(784.0)   . Arthritis   . Osteoarthritis of left shoulder 07/20/2013    Surgeries: Procedure(s): TOTAL SHOULDER ARTHROPLASTY on 07/20/2013   Consultants (if any):    Discharged Condition: Improved  Hospital Course: Danielle [name] is an 80 y.o. female who was admitted 07/20/2013 with a diagnosis of Osteoarthritis of left shoulder and went to the operating room on 07/20/2013 and underwent the above named procedures.    She was given perioperative antibiotics:  Anti-infectives   Start     Dose/Rate Route Frequency Ordered Stop   07/20/13 1315  ceFAZolin (ANCEF) IVPB 2 g/50 mL premix     2 g 100 mL/hr over 30 Minutes Intravenous Every 6 hours 07/20/13 1301 07/21/13 0149   07/20/13 0600  ceFAZolin (ANCEF) IVPB 2 g/50 mL premix     2 g 100 mL/hr over 30 Minutes Intravenous On call to O.R. 07/19/13 1408 07/20/13 0745    .  She was given sequential compression devices, early ambulation  for DVT prophylaxis.  She benefited maximally from the hospital stay and there were no complications.    Recent vital signs:  Filed Vitals:   07/21/13 0525  BP: 108/51  Pulse: 81  Temp: 99.4 F (37.4 C)  Resp: 18    Recent laboratory studies:  Lab Results  Component Value Date   HGB 11.8* 07/21/2013   HGB 14.1 07/12/2013   HGB 14.8 12/11/2008   Lab Results  Component Value Date   WBC 7.5 07/21/2013   PLT 160 07/21/2013   No results found for this  basename: INR   Lab Results  Component Value Date   NA 135* 07/21/2013   K 4.3 07/21/2013   CL 100 07/21/2013   CO2 22 07/21/2013   BUN 14 07/21/2013   CREATININE 0.91 07/21/2013   GLUCOSE 116* 07/21/2013    Discharge Medications:     Medication List         albuterol 108 (90 BASE) MCG/ACT inhaler  Commonly known as:  PROVENTIL HFA;VENTOLIN HFA  Inhale 1-2 puffs into the lungs every 6 (six) hours as needed for wheezing or shortness of breath.     alendronate 70 MG tablet  Commonly known as:  FOSAMAX  Take 70 mg by mouth once a week. On Sunday     ALPRAZolam 0.5 MG tablet  Commonly known as:  XANAX  Take 0.5 mg by mouth at bedtime as needed for sleep.     aspirin EC 81 MG tablet  Take 81 mg by mouth daily.     calcium carbonate 600 MG Tabs tablet  Commonly known as:  OS-CAL  Take 600 mg by mouth daily with breakfast.     cetirizine 10 MG tablet  Commonly known as:  ZYRTEC  Take 10 mg by mouth daily.     cholecalciferol 1000 UNITS tablet  Commonly known as:  VITAMIN D  Take 1,000 Units by mouth daily.     fluticasone 50  MCG/ACT nasal spray  Commonly known as:  FLONASE  Place 1 spray into both nostrils daily.     guaiFENesin 600 MG 12 hr tablet  Commonly known as:  MUCINEX  Take 1,200 mg by mouth 2 (two) times daily as needed for to loosen phlegm.     HYDROcodone-acetaminophen 10-325 MG per tablet  Commonly known as:  NORCO  Take 1-2 tablets by mouth every 6 (six) hours as needed.     levothyroxine 125 MCG tablet  Commonly known as:  SYNTHROID, LEVOTHROID  Take 125 mcg by mouth daily before breakfast.     multivitamin with minerals Tabs tablet  Take 1 tablet by mouth daily.     nebivolol 10 MG tablet  Commonly known as:  BYSTOLIC  Take 10 mg by mouth daily.     OVER THE COUNTER MEDICATION  Take 1 capsule by mouth daily. Mega red-3     polyethylene glycol packet  Commonly known as:  MIRALAX / GLYCOLAX  Take 17 g by mouth daily as needed for moderate  constipation.        Diagnostic Studies: Dg Chest 2 View  07/12/2013   CLINICAL DATA:  Shoulder replacement and asthma.  EXAM: CHEST  2 VIEW  COMPARISON:  12/11/2008 and 03/06/2004  FINDINGS: Subchondral sclerosis and degenerative disease at the left glenohumeral joint. Lucency in the proximal right humerus most likely represents subchondral cyst formations. Heart and mediastinum are within normal limits. Small patchy densities at the left lung base could be related to overlying structures and vascular markings. This appears to be different from the prior examinations. Densities along the lower thoracic spine on the lateral view could represent degenerative bone changes. No suspicious airspace disease.  IMPRESSION: Nonspecific patchy densities at the left lung base as described. This most likely represents overlying structures and possibly scarring. Recommend a short-term follow-up chest examination to ensure stability or even resolution.  Degenerative changes in both shoulders.   Electronically Signed   By: Markus Daft M.D.   On: 07/12/2013 11:43   Dg Shoulder Left  07/20/2013   CLINICAL DATA:  Post left shoulder arthroplasty.  EXAM: LEFT SHOULDER - 2+ VIEW  COMPARISON:  None.  FINDINGS: Alignment of a left shoulder arthroplasty appears near anatomic in the frontal projection. No fracture is identified.  IMPRESSION: Normal alignment of left shoulder arthroplasty.   Electronically Signed   By: Aletta Edouard M.D.   On: 07/20/2013 13:46    Disposition:         Follow-up Information   Follow up with [name], MD. Schedule an appointment as soon as possible for a visit in 2 weeks.   Specialty:  Orthopedic Surgery   Contact information:   [address] [place] [address] [phone]        Signed: [name] 07/21/2013, 12:14 PM

## 2013-07-21 NOTE — Progress Notes (Signed)
PT Cancellation Note  Patient Details Name: Danielle [name] MRN: [id number] DOB: [birth year]   Cancelled Treatment:    Reason Eval/Treat Not Completed: PT screened, no needs identified, will sign off. Spoke with OT; pt at supervision to MOD i level for mobility at this time. No acute PT needs warranted at this time.    [place], [place] 07/21/2013, 10:36 AM

## 2013-08-04 DIAGNOSIS — S42309D Unspecified fracture of shaft of humerus, unspecified arm, subsequent encounter for fracture with routine healing: Secondary | ICD-10-CM | POA: Diagnosis not present

## 2013-09-01 ENCOUNTER — Other Ambulatory Visit: Payer: Self-pay | Admitting: Orthopedic Surgery

## 2013-09-01 DIAGNOSIS — M19019 Primary osteoarthritis, unspecified shoulder: Secondary | ICD-10-CM | POA: Diagnosis not present

## 2013-09-01 DIAGNOSIS — M25511 Pain in right shoulder: Secondary | ICD-10-CM

## 2013-09-02 ENCOUNTER — Other Ambulatory Visit: Payer: Medicare Other

## 2013-09-06 ENCOUNTER — Ambulatory Visit
Admission: RE | Admit: 2013-09-06 | Discharge: 2013-09-06 | Disposition: A | Discharge status: Home / Self Care | Payer: Medicare Other | Source: Ambulatory Visit | Attending: Orthopedic Surgery | Admitting: Orthopedic Surgery

## 2013-09-06 DIAGNOSIS — M25511 Pain in right shoulder: Secondary | ICD-10-CM

## 2013-09-06 DIAGNOSIS — M19019 Primary osteoarthritis, unspecified shoulder: Secondary | ICD-10-CM | POA: Diagnosis not present

## 2013-09-07 ENCOUNTER — Telehealth: Payer: Self-pay | Admitting: *Deleted

## 2013-09-07 NOTE — Telephone Encounter (Signed)
Faxed clearance for right shoulder replacement scheduled for 12/14/13

## 2013-09-08 DIAGNOSIS — M25619 Stiffness of unspecified shoulder, not elsewhere classified: Secondary | ICD-10-CM | POA: Diagnosis not present

## 2013-09-08 DIAGNOSIS — M6281 Muscle weakness (generalized): Secondary | ICD-10-CM | POA: Diagnosis not present

## 2013-09-08 DIAGNOSIS — M25519 Pain in unspecified shoulder: Secondary | ICD-10-CM | POA: Diagnosis not present

## 2013-09-14 DIAGNOSIS — M25519 Pain in unspecified shoulder: Secondary | ICD-10-CM | POA: Diagnosis not present

## 2013-09-14 DIAGNOSIS — M6281 Muscle weakness (generalized): Secondary | ICD-10-CM | POA: Diagnosis not present

## 2013-09-14 DIAGNOSIS — M25619 Stiffness of unspecified shoulder, not elsewhere classified: Secondary | ICD-10-CM | POA: Diagnosis not present

## 2013-09-15 DIAGNOSIS — M25619 Stiffness of unspecified shoulder, not elsewhere classified: Secondary | ICD-10-CM | POA: Diagnosis not present

## 2013-09-15 DIAGNOSIS — M25519 Pain in unspecified shoulder: Secondary | ICD-10-CM | POA: Diagnosis not present

## 2013-09-15 DIAGNOSIS — M6281 Muscle weakness (generalized): Secondary | ICD-10-CM | POA: Diagnosis not present

## 2013-09-21 DIAGNOSIS — M6281 Muscle weakness (generalized): Secondary | ICD-10-CM | POA: Diagnosis not present

## 2013-09-21 DIAGNOSIS — M25619 Stiffness of unspecified shoulder, not elsewhere classified: Secondary | ICD-10-CM | POA: Diagnosis not present

## 2013-09-21 DIAGNOSIS — M25519 Pain in unspecified shoulder: Secondary | ICD-10-CM | POA: Diagnosis not present

## 2013-09-22 ENCOUNTER — Other Ambulatory Visit: Payer: Self-pay | Admitting: Orthopedic Surgery

## 2013-09-23 DIAGNOSIS — M25519 Pain in unspecified shoulder: Secondary | ICD-10-CM | POA: Diagnosis not present

## 2013-09-23 DIAGNOSIS — M6281 Muscle weakness (generalized): Secondary | ICD-10-CM | POA: Diagnosis not present

## 2013-09-23 DIAGNOSIS — M25619 Stiffness of unspecified shoulder, not elsewhere classified: Secondary | ICD-10-CM | POA: Diagnosis not present

## 2013-09-28 DIAGNOSIS — M6281 Muscle weakness (generalized): Secondary | ICD-10-CM | POA: Diagnosis not present

## 2013-09-28 DIAGNOSIS — M25619 Stiffness of unspecified shoulder, not elsewhere classified: Secondary | ICD-10-CM | POA: Diagnosis not present

## 2013-09-28 DIAGNOSIS — M25519 Pain in unspecified shoulder: Secondary | ICD-10-CM | POA: Diagnosis not present

## 2013-09-30 DIAGNOSIS — M25619 Stiffness of unspecified shoulder, not elsewhere classified: Secondary | ICD-10-CM | POA: Diagnosis not present

## 2013-09-30 DIAGNOSIS — M25519 Pain in unspecified shoulder: Secondary | ICD-10-CM | POA: Diagnosis not present

## 2013-09-30 DIAGNOSIS — M6281 Muscle weakness (generalized): Secondary | ICD-10-CM | POA: Diagnosis not present

## 2013-10-12 DIAGNOSIS — R300 Dysuria: Secondary | ICD-10-CM | POA: Diagnosis not present

## 2013-10-12 DIAGNOSIS — Z6827 Body mass index (BMI) 27.0-27.9, adult: Secondary | ICD-10-CM | POA: Diagnosis not present

## 2013-10-12 DIAGNOSIS — M545 Low back pain, unspecified: Secondary | ICD-10-CM | POA: Diagnosis not present

## 2013-10-21 ENCOUNTER — Other Ambulatory Visit: Payer: Self-pay | Admitting: Cardiovascular Disease

## 2013-10-21 NOTE — Telephone Encounter (Signed)
E sentrx 

## 2013-11-04 ENCOUNTER — Other Ambulatory Visit: Payer: Self-pay | Admitting: Orthopedic Surgery

## 2013-11-04 ENCOUNTER — Encounter (HOSPITAL_COMMUNITY): Payer: Self-pay | Admitting: Pharmacy Technician

## 2013-11-08 ENCOUNTER — Encounter (HOSPITAL_COMMUNITY): Payer: Self-pay

## 2013-11-08 ENCOUNTER — Encounter (HOSPITAL_COMMUNITY)
Admission: RE | Admit: 2013-11-08 | Discharge: 2013-11-08 | Disposition: A | Discharge status: Home / Self Care | Payer: Medicare Other | Source: Ambulatory Visit | Attending: Orthopedic Surgery | Admitting: Orthopedic Surgery

## 2013-11-08 DIAGNOSIS — Z01818 Encounter for other preprocedural examination: Secondary | ICD-10-CM | POA: Insufficient documentation

## 2013-11-08 DIAGNOSIS — M19019 Primary osteoarthritis, unspecified shoulder: Secondary | ICD-10-CM | POA: Insufficient documentation

## 2013-11-08 HISTORY — DX: Anxiety disorder, unspecified: F41.9

## 2013-11-08 LAB — BASIC METABOLIC PANEL
Anion gap: 14 (ref 5–15)
BUN: 10 mg/dL (ref 6–23)
CO2: 22 mEq/L (ref 19–32)
Calcium: 9.6 mg/dL (ref 8.4–10.5)
Chloride: 105 mEq/L (ref 96–112)
Creatinine, Ser: 0.81 mg/dL (ref 0.50–1.10)
GFR calc non Af Amer: 67 mL/min — ABNORMAL LOW (ref >90)
GFR, EST AFRICAN AMERICAN: 78 mL/min — AB (ref >90)
GLUCOSE: 101 mg/dL — AB (ref 70–99)
POTASSIUM: 4.8 meq/L (ref 3.7–5.3)
Sodium: 141 mEq/L (ref 137–147)

## 2013-11-08 LAB — TYPE AND SCREEN
ABO/RH(D): O POS
ANTIBODY SCREEN: NEGATIVE

## 2013-11-08 LAB — CBC
HEMATOCRIT: 42.4 % (ref 36.0–46.0)
HEMOGLOBIN: 14.5 g/dL (ref 12.0–15.0)
MCH: 30.2 pg (ref 26.0–34.0)
MCHC: 34.2 g/dL (ref 30.0–36.0)
MCV: 88.3 fL (ref 78.0–100.0)
Platelets: 205 10*3/uL (ref 150–400)
RBC: 4.8 MIL/uL (ref 3.87–5.11)
RDW: 13.6 % (ref 11.5–15.5)
WBC: 7.3 10*3/uL (ref 4.0–10.5)

## 2013-11-08 NOTE — Progress Notes (Signed)
Pt denies SOB and chest pain. Pt currently under the care of Dr. [name] ( cardiology). Pt PCP is Dr. Dagmar Hait Rvisankar. Pt chart forwarded to [place], [place] ( anesthesia) to review  cardiac history.

## 2013-11-09 NOTE — Progress Notes (Signed)
Anesthesia Chart Review: Patient is a 81 year old female scheduled for right total shoulder arthroplasty on 11/16/13 by Dr. [name].   History includes former smoker, dysrhythmia (palpitations), murmur (mild AR/TR, mild moderate MR by 12/2012 echo), PVD with history of right carotid endarterectomy '05, hypothyroidism, asthma, headaches, arthritis, gastric restriction surgery '06, left total shoulder 07/20/13. PCP is Dr. Steva Ready [facility] [phone]). Cardiologist is Dr. [name] who cleared patient prior to her left total shoulder in 07/2013 following a low risk stress test.   EKG on 07/12/13 showed NSR.   Nuclear stress test on 06/30/13 showed: Overall Impression: Low risk stress nuclear study, with likely artifact related lack of tracer uptake in the basal septal wall. Dr. [name] felt test was low risk.  Echo on 01/04/13 showed: Normal LV systolic function, EF 09-38%, no regional wall motion abnormalities, grade 1 diastolic dysfunction, aortic sclerosis without stenosis, trace to mild AR, mild to moderate MR, mild TR, PA peak pressure 33 mmHg.   Carotid duplex on 12/28/12 showed: Mild plaque with no diameter reduction of bilateral ICA.   CXR on 07/12/13 showed: FINDINGS: Subchondral sclerosis and degenerative disease at the left glenohumeral joint. Lucency in the proximal right humerus most likely represents subchondral cyst formations. Heart and mediastinum are within normal limits. Small patchy densities at the left lung base could be related to overlying structures and vascular markings. This appears to be different from the prior examinations. Densities along the lower thoracic spine on the lateral view could represent degenerative bone changes. No suspicious airspace disease. IMPRESSION: Nonspecific patchy densities at the left lung base as described. This most likely represents overlying structures and possibly scarring. Recommend a short-term follow-up chest examination to ensure stability or even  resolution. Degenerative changes in both shoulders. (Report was already called to Dr. Luanna Cole office prior to her shoulder surgery in 07/2013.)  Preoperative labs noted.   She tolerated similar procedure just over three months ago.  If no acute changes then I would anticipate that she could proceed as planned.  George Hugh [facility] Short Stay Center/Anesthesiology Phone [phone] 11/09/2013 2:57 PM

## 2013-11-12 NOTE — Progress Notes (Signed)
INSTRUCTED PATIENT TO ARRIVE AT 530 AM  ON 11/16/13.

## 2013-11-15 MED ORDER — CEFAZOLIN SODIUM-DEXTROSE 2-3 GM-% IV SOLR: 2.0000 g | INTRAVENOUS | Status: DC

## 2013-11-15 MED ORDER — CEFAZOLIN SODIUM-DEXTROSE 2-3 GM-% IV SOLR
2.0000 g | INTRAVENOUS | Status: AC
  Administered 2013-11-16: 2 g via INTRAVENOUS
  Filled 2013-11-15: qty 50

## 2013-11-16 ENCOUNTER — Encounter (HOSPITAL_COMMUNITY)
Admission: RE | Disposition: A | Discharge status: Home / Self Care | Payer: Self-pay | Source: Ambulatory Visit | Attending: Orthopedic Surgery

## 2013-11-16 ENCOUNTER — Encounter (HOSPITAL_COMMUNITY): Payer: Self-pay | Admitting: *Deleted

## 2013-11-16 ENCOUNTER — Inpatient Hospital Stay (HOSPITAL_COMMUNITY): Payer: Medicare Other

## 2013-11-16 ENCOUNTER — Inpatient Hospital Stay (HOSPITAL_COMMUNITY): Payer: Medicare Other | Admitting: Anesthesiology

## 2013-11-16 ENCOUNTER — Encounter (HOSPITAL_COMMUNITY): Payer: Medicare Other | Admitting: Vascular Surgery

## 2013-11-16 ENCOUNTER — Inpatient Hospital Stay (HOSPITAL_COMMUNITY)
Admission: RE | Admit: 2013-11-16 | Discharge: 2013-11-19 | DRG: 483 | Disposition: A | Discharge status: Home / Self Care | Payer: Medicare Other | Source: Ambulatory Visit | Attending: Orthopedic Surgery | Admitting: Orthopedic Surgery

## 2013-11-16 DIAGNOSIS — Z9849 Cataract extraction status, unspecified eye: Secondary | ICD-10-CM | POA: Diagnosis not present

## 2013-11-16 DIAGNOSIS — Y9210 Unspecified residential institution as the place of occurrence of the external cause: Secondary | ICD-10-CM | POA: Diagnosis not present

## 2013-11-16 DIAGNOSIS — G92 Toxic encephalopathy: Secondary | ICD-10-CM | POA: Diagnosis not present

## 2013-11-16 DIAGNOSIS — Z87891 Personal history of nicotine dependence: Secondary | ICD-10-CM

## 2013-11-16 DIAGNOSIS — T426X5A Adverse effect of other antiepileptic and sedative-hypnotic drugs, initial encounter: Secondary | ICD-10-CM | POA: Diagnosis not present

## 2013-11-16 DIAGNOSIS — M19011 Primary osteoarthritis, right shoulder: Secondary | ICD-10-CM

## 2013-11-16 DIAGNOSIS — IMO0002 Reserved for concepts with insufficient information to code with codable children: Secondary | ICD-10-CM | POA: Diagnosis not present

## 2013-11-16 DIAGNOSIS — Z7982 Long term (current) use of aspirin: Secondary | ICD-10-CM | POA: Diagnosis not present

## 2013-11-16 DIAGNOSIS — M19019 Primary osteoarthritis, unspecified shoulder: Secondary | ICD-10-CM | POA: Diagnosis present

## 2013-11-16 DIAGNOSIS — E785 Hyperlipidemia, unspecified: Secondary | ICD-10-CM

## 2013-11-16 DIAGNOSIS — G929 Unspecified toxic encephalopathy: Secondary | ICD-10-CM | POA: Diagnosis not present

## 2013-11-16 DIAGNOSIS — G8918 Other acute postprocedural pain: Secondary | ICD-10-CM | POA: Diagnosis not present

## 2013-11-16 DIAGNOSIS — Z801 Family history of malignant neoplasm of trachea, bronchus and lung: Secondary | ICD-10-CM

## 2013-11-16 DIAGNOSIS — I739 Peripheral vascular disease, unspecified: Secondary | ICD-10-CM | POA: Diagnosis present

## 2013-11-16 DIAGNOSIS — Z96619 Presence of unspecified artificial shoulder joint: Secondary | ICD-10-CM

## 2013-11-16 DIAGNOSIS — M1990 Unspecified osteoarthritis, unspecified site: Secondary | ICD-10-CM | POA: Diagnosis not present

## 2013-11-16 DIAGNOSIS — Z961 Presence of intraocular lens: Secondary | ICD-10-CM

## 2013-11-16 DIAGNOSIS — R110 Nausea: Secondary | ICD-10-CM | POA: Diagnosis not present

## 2013-11-16 DIAGNOSIS — Z79899 Other long term (current) drug therapy: Secondary | ICD-10-CM

## 2013-11-16 DIAGNOSIS — Z471 Aftercare following joint replacement surgery: Secondary | ICD-10-CM | POA: Diagnosis not present

## 2013-11-16 DIAGNOSIS — G9340 Encephalopathy, unspecified: Secondary | ICD-10-CM | POA: Diagnosis not present

## 2013-11-16 DIAGNOSIS — J45909 Unspecified asthma, uncomplicated: Secondary | ICD-10-CM | POA: Diagnosis present

## 2013-11-16 DIAGNOSIS — F411 Generalized anxiety disorder: Secondary | ICD-10-CM | POA: Diagnosis present

## 2013-11-16 DIAGNOSIS — E039 Hypothyroidism, unspecified: Secondary | ICD-10-CM | POA: Diagnosis present

## 2013-11-16 DIAGNOSIS — T4275XA Adverse effect of unspecified antiepileptic and sedative-hypnotic drugs, initial encounter: Secondary | ICD-10-CM

## 2013-11-16 DIAGNOSIS — I10 Essential (primary) hypertension: Secondary | ICD-10-CM | POA: Diagnosis present

## 2013-11-16 HISTORY — PX: TOTAL SHOULDER ARTHROPLASTY: SHX126

## 2013-11-16 HISTORY — DX: Primary osteoarthritis, right shoulder: M19.011

## 2013-11-16 SURGERY — ARTHROPLASTY, SHOULDER, TOTAL
Anesthesia: Regional | Site: Shoulder | Laterality: Right

## 2013-11-16 MED ORDER — METOCLOPRAMIDE HCL 5 MG PO TABS
5.0000 mg | ORAL_TABLET | Freq: Three times a day (TID) | ORAL | Status: DC | PRN
  Filled 2013-11-16: qty 2

## 2013-11-16 MED ORDER — BACLOFEN 10 MG PO TABS: 10.0000 mg | ORAL_TABLET | Freq: Three times a day (TID) | ORAL | Status: DC

## 2013-11-16 MED ORDER — ROCURONIUM BROMIDE 100 MG/10ML IV SOLN
INTRAVENOUS | Status: DC | PRN
  Administered 2013-11-16: 40 mg via INTRAVENOUS

## 2013-11-16 MED ORDER — LORATADINE 10 MG PO TABS
10.0000 mg | ORAL_TABLET | Freq: Every day | ORAL | Status: DC
  Administered 2013-11-18 – 2013-11-19 (×2): 10 mg via ORAL
  Filled 2013-11-16 (×4): qty 1

## 2013-11-16 MED ORDER — HYDROCODONE-ACETAMINOPHEN 10-325 MG PO TABS: 1.0000 | ORAL_TABLET | Freq: Four times a day (QID) | ORAL | Status: DC | PRN

## 2013-11-16 MED ORDER — ALBUTEROL SULFATE (2.5 MG/3ML) 0.083% IN NEBU: 2.5000 mg | INHALATION_SOLUTION | Freq: Four times a day (QID) | RESPIRATORY_TRACT | Status: DC | PRN

## 2013-11-16 MED ORDER — GLYCOPYRROLATE 0.2 MG/ML IJ SOLN
INTRAMUSCULAR | Status: DC | PRN
  Administered 2013-11-16: .6 mg via INTRAVENOUS

## 2013-11-16 MED ORDER — DIPHENHYDRAMINE HCL 12.5 MG/5ML PO ELIX: 12.5000 mg | ORAL_SOLUTION | ORAL | Status: DC | PRN

## 2013-11-16 MED ORDER — OXYCODONE HCL 5 MG PO TABS: 5.0000 mg | ORAL_TABLET | Freq: Once | ORAL | Status: DC | PRN

## 2013-11-16 MED ORDER — CALCIUM CARBONATE 1250 (500 CA) MG PO TABS
1.0000 | ORAL_TABLET | Freq: Every day | ORAL | Status: DC
  Administered 2013-11-18 – 2013-11-19 (×2): 500 mg via ORAL
  Filled 2013-11-16 (×5): qty 1

## 2013-11-16 MED ORDER — GLYCOPYRROLATE 0.2 MG/ML IJ SOLN
INTRAMUSCULAR | Status: AC
  Filled 2013-11-16: qty 3

## 2013-11-16 MED ORDER — SODIUM CHLORIDE 0.9 % IR SOLN
Status: DC | PRN
  Administered 2013-11-16: 1000 mL

## 2013-11-16 MED ORDER — MIDAZOLAM HCL 5 MG/5ML IJ SOLN
INTRAMUSCULAR | Status: DC | PRN
Start: 2013-11-16 — End: 2013-11-16
  Administered 2013-11-16 (×2): 0.5 mg via INTRAVENOUS

## 2013-11-16 MED ORDER — ALUM & MAG HYDROXIDE-SIMETH 200-200-20 MG/5ML PO SUSP: 30.0000 mL | ORAL | Status: DC | PRN

## 2013-11-16 MED ORDER — BISACODYL 10 MG RE SUPP: 10.0000 mg | Freq: Every day | RECTAL | Status: DC | PRN

## 2013-11-16 MED ORDER — BUPIVACAINE-EPINEPHRINE (PF) 0.5% -1:200000 IJ SOLN
INTRAMUSCULAR | Status: DC | PRN
  Administered 2013-11-16: 25 mL

## 2013-11-16 MED ORDER — LACTATED RINGERS IV SOLN
INTRAVENOUS | Status: DC | PRN
  Administered 2013-11-16 (×2): via INTRAVENOUS

## 2013-11-16 MED ORDER — PHENYLEPHRINE HCL 10 MG/ML IJ SOLN
INTRAMUSCULAR | Status: DC | PRN
  Administered 2013-11-16: 40 ug via INTRAVENOUS

## 2013-11-16 MED ORDER — NEBIVOLOL HCL 10 MG PO TABS
10.0000 mg | ORAL_TABLET | Freq: Every day | ORAL | Status: DC
  Administered 2013-11-17 – 2013-11-19 (×3): 10 mg via ORAL
  Filled 2013-11-16 (×3): qty 1

## 2013-11-16 MED ORDER — VITAMIN D3 25 MCG (1000 UNIT) PO TABS
1000.0000 [IU] | ORAL_TABLET | Freq: Every day | ORAL | Status: DC
  Administered 2013-11-18 – 2013-11-19 (×2): 1000 [IU] via ORAL
  Filled 2013-11-16 (×4): qty 1

## 2013-11-16 MED ORDER — LEVOTHYROXINE SODIUM 125 MCG PO TABS
125.0000 ug | ORAL_TABLET | Freq: Every day | ORAL | Status: DC
  Administered 2013-11-18 – 2013-11-19 (×2): 125 ug via ORAL
  Filled 2013-11-16 (×4): qty 1

## 2013-11-16 MED ORDER — MENTHOL 3 MG MT LOZG: 1.0000 | LOZENGE | OROMUCOSAL | Status: DC | PRN

## 2013-11-16 MED ORDER — ASPIRIN EC 81 MG PO TBEC
81.0000 mg | DELAYED_RELEASE_TABLET | Freq: Every day | ORAL | Status: DC
  Administered 2013-11-18 – 2013-11-19 (×2): 81 mg via ORAL
  Filled 2013-11-16 (×3): qty 1

## 2013-11-16 MED ORDER — HYDROMORPHONE HCL PF 1 MG/ML IJ SOLN
INTRAMUSCULAR | Status: AC
  Administered 2013-11-16: 11:00:00
  Filled 2013-11-16: qty 1

## 2013-11-16 MED ORDER — ADULT MULTIVITAMIN W/MINERALS CH
1.0000 | ORAL_TABLET | Freq: Every day | ORAL | Status: DC
  Administered 2013-11-18 – 2013-11-19 (×2): 1 via ORAL
  Filled 2013-11-16 (×4): qty 1

## 2013-11-16 MED ORDER — ESMOLOL HCL 10 MG/ML IV SOLN
INTRAVENOUS | Status: DC | PRN
  Administered 2013-11-16: 50 mg via INTRAVENOUS

## 2013-11-16 MED ORDER — OXYCODONE HCL 5 MG/5ML PO SOLN: 5.0000 mg | Freq: Once | ORAL | Status: DC | PRN

## 2013-11-16 MED ORDER — OXYCODONE HCL 5 MG PO TABS
5.0000 mg | ORAL_TABLET | ORAL | Status: DC | PRN
  Administered 2013-11-17: 10 mg via ORAL
  Filled 2013-11-16: qty 2

## 2013-11-16 MED ORDER — ROCURONIUM BROMIDE 50 MG/5ML IV SOLN
INTRAVENOUS | Status: AC
  Filled 2013-11-16: qty 1

## 2013-11-16 MED ORDER — POLYETHYLENE GLYCOL 3350 17 G PO PACK: 17.0000 g | PACK | Freq: Every day | ORAL | Status: DC | PRN

## 2013-11-16 MED ORDER — PROMETHAZINE HCL 25 MG/ML IJ SOLN: 6.2500 mg | INTRAMUSCULAR | Status: DC | PRN

## 2013-11-16 MED ORDER — PROPOFOL 10 MG/ML IV BOLUS
INTRAVENOUS | Status: DC | PRN
  Administered 2013-11-16: 110 mg via INTRAVENOUS

## 2013-11-16 MED ORDER — SODIUM CHLORIDE 0.9 % IR SOLN
Status: DC | PRN
  Administered 2013-11-16: 3000 mL

## 2013-11-16 MED ORDER — ONDANSETRON HCL 4 MG PO TABS: 4.0000 mg | ORAL_TABLET | Freq: Three times a day (TID) | ORAL | Status: DC | PRN

## 2013-11-16 MED ORDER — SENNA-DOCUSATE SODIUM 8.6-50 MG PO TABS: 2.0000 | ORAL_TABLET | Freq: Every day | ORAL | Status: DC

## 2013-11-16 MED ORDER — PROPOFOL 10 MG/ML IV BOLUS
INTRAVENOUS | Status: AC
Start: 2013-11-16 — End: 2013-11-16
  Filled 2013-11-16: qty 20

## 2013-11-16 MED ORDER — FENTANYL CITRATE 0.05 MG/ML IJ SOLN
INTRAMUSCULAR | Status: AC
  Filled 2013-11-16: qty 5

## 2013-11-16 MED ORDER — ACETAMINOPHEN 650 MG RE SUPP: 650.0000 mg | Freq: Four times a day (QID) | RECTAL | Status: DC | PRN

## 2013-11-16 MED ORDER — ALBUTEROL SULFATE HFA 108 (90 BASE) MCG/ACT IN AERS: 1.0000 | INHALATION_SPRAY | Freq: Four times a day (QID) | RESPIRATORY_TRACT | Status: DC | PRN

## 2013-11-16 MED ORDER — ACETAMINOPHEN 325 MG PO TABS
650.0000 mg | ORAL_TABLET | Freq: Four times a day (QID) | ORAL | Status: DC | PRN
  Administered 2013-11-16 – 2013-11-19 (×5): 650 mg via ORAL
  Filled 2013-11-16 (×5): qty 2

## 2013-11-16 MED ORDER — MIDAZOLAM HCL 2 MG/2ML IJ SOLN
INTRAMUSCULAR | Status: AC
  Filled 2013-11-16: qty 2

## 2013-11-16 MED ORDER — FLUTICASONE PROPIONATE 50 MCG/ACT NA SUSP
1.0000 | Freq: Every day | NASAL | Status: DC | PRN
  Filled 2013-11-16: qty 16

## 2013-11-16 MED ORDER — POTASSIUM CHLORIDE IN NACL 20-0.45 MEQ/L-% IV SOLN
INTRAVENOUS | Status: DC
  Administered 2013-11-16: 75 mL/h via INTRAVENOUS
  Filled 2013-11-16 (×7): qty 1000

## 2013-11-16 MED ORDER — PHENOL 1.4 % MT LIQD
1.0000 | OROMUCOSAL | Status: DC | PRN
  Administered 2013-11-16: 1 via OROMUCOSAL
  Filled 2013-11-16: qty 177

## 2013-11-16 MED ORDER — METHOCARBAMOL 500 MG PO TABS: 500.0000 mg | ORAL_TABLET | Freq: Four times a day (QID) | ORAL | Status: DC | PRN

## 2013-11-16 MED ORDER — ARTIFICIAL TEARS OP OINT
TOPICAL_OINTMENT | OPHTHALMIC | Status: DC | PRN
  Administered 2013-11-16: 1 via OPHTHALMIC

## 2013-11-16 MED ORDER — SCOPOLAMINE 1 MG/3DAYS TD PT72
1.0000 | MEDICATED_PATCH | TRANSDERMAL | Status: DC
  Administered 2013-11-16: 1.5 mg via TRANSDERMAL
  Filled 2013-11-16 (×2): qty 1

## 2013-11-16 MED ORDER — NEOSTIGMINE METHYLSULFATE 10 MG/10ML IV SOLN
INTRAVENOUS | Status: AC
  Filled 2013-11-16: qty 2

## 2013-11-16 MED ORDER — DOCUSATE SODIUM 100 MG PO CAPS
100.0000 mg | ORAL_CAPSULE | Freq: Two times a day (BID) | ORAL | Status: DC
  Administered 2013-11-16 – 2013-11-19 (×5): 100 mg via ORAL
  Filled 2013-11-16 (×7): qty 1

## 2013-11-16 MED ORDER — OXYCODONE-ACETAMINOPHEN 5-325 MG PO TABS
1.0000 | ORAL_TABLET | ORAL | Status: DC | PRN
  Administered 2013-11-16 (×2): 1 via ORAL
  Filled 2013-11-16: qty 2
  Filled 2013-11-16: qty 1

## 2013-11-16 MED ORDER — FENTANYL CITRATE 0.05 MG/ML IJ SOLN
INTRAMUSCULAR | Status: DC | PRN
  Administered 2013-11-16 (×2): 50 ug via INTRAVENOUS

## 2013-11-16 MED ORDER — ONDANSETRON HCL 4 MG/2ML IJ SOLN
INTRAMUSCULAR | Status: AC
  Filled 2013-11-16: qty 2

## 2013-11-16 MED ORDER — ESMOLOL HCL 10 MG/ML IV SOLN
INTRAVENOUS | Status: AC
Start: 2013-11-16 — End: 2013-11-16
  Filled 2013-11-16: qty 10

## 2013-11-16 MED ORDER — ONDANSETRON HCL 4 MG/2ML IJ SOLN
4.0000 mg | Freq: Four times a day (QID) | INTRAMUSCULAR | Status: DC | PRN
  Administered 2013-11-17: 4 mg via INTRAVENOUS
  Filled 2013-11-16: qty 2

## 2013-11-16 MED ORDER — ONDANSETRON HCL 4 MG PO TABS: 4.0000 mg | ORAL_TABLET | Freq: Four times a day (QID) | ORAL | Status: DC | PRN

## 2013-11-16 MED ORDER — CEFAZOLIN SODIUM 1-5 GM-% IV SOLN
1.0000 g | Freq: Four times a day (QID) | INTRAVENOUS | Status: AC
  Administered 2013-11-16 – 2013-11-17 (×2): 1 g via INTRAVENOUS
  Filled 2013-11-16 (×4): qty 50

## 2013-11-16 MED ORDER — NEOSTIGMINE METHYLSULFATE 10 MG/10ML IV SOLN
INTRAVENOUS | Status: DC | PRN
  Administered 2013-11-16: 4 mg via INTRAVENOUS

## 2013-11-16 MED ORDER — DEXTROSE 5 % IV SOLN
INTRAVENOUS | Status: DC | PRN
  Administered 2013-11-16: 08:00:00 via INTRAVENOUS

## 2013-11-16 MED ORDER — MAGNESIUM CITRATE PO SOLN: 1.0000 | Freq: Once | ORAL | Status: AC | PRN

## 2013-11-16 MED ORDER — SENNA 8.6 MG PO TABS
1.0000 | ORAL_TABLET | Freq: Two times a day (BID) | ORAL | Status: DC
  Administered 2013-11-16 – 2013-11-19 (×5): 8.6 mg via ORAL
  Filled 2013-11-16 (×8): qty 1

## 2013-11-16 MED ORDER — MORPHINE SULFATE 2 MG/ML IJ SOLN: 1.0000 mg | INTRAMUSCULAR | Status: DC | PRN

## 2013-11-16 MED ORDER — ARTIFICIAL TEARS OP OINT
TOPICAL_OINTMENT | OPHTHALMIC | Status: AC
  Filled 2013-11-16: qty 3.5

## 2013-11-16 MED ORDER — ONDANSETRON HCL 4 MG/2ML IJ SOLN
INTRAMUSCULAR | Status: DC | PRN
  Administered 2013-11-16: 4 mg via INTRAVENOUS

## 2013-11-16 MED ORDER — METHOCARBAMOL 1000 MG/10ML IJ SOLN
500.0000 mg | Freq: Four times a day (QID) | INTRAVENOUS | Status: DC | PRN
  Filled 2013-11-16: qty 5

## 2013-11-16 MED ORDER — HYDROCODONE-ACETAMINOPHEN 10-325 MG PO TABS: 1.0000 | ORAL_TABLET | ORAL | Status: DC | PRN

## 2013-11-16 MED ORDER — METOCLOPRAMIDE HCL 5 MG/ML IJ SOLN: 5.0000 mg | Freq: Three times a day (TID) | INTRAMUSCULAR | Status: DC | PRN

## 2013-11-16 MED ORDER — PHENYLEPHRINE 40 MCG/ML (10ML) SYRINGE FOR IV PUSH (FOR BLOOD PRESSURE SUPPORT)
PREFILLED_SYRINGE | INTRAVENOUS | Status: AC
  Filled 2013-11-16: qty 10

## 2013-11-16 MED ORDER — ALPRAZOLAM 0.5 MG PO TABS: 0.5000 mg | ORAL_TABLET | Freq: Every evening | ORAL | Status: DC | PRN

## 2013-11-16 MED ORDER — GUAIFENESIN ER 600 MG PO TB12
1200.0000 mg | ORAL_TABLET | Freq: Two times a day (BID) | ORAL | Status: DC | PRN
  Filled 2013-11-16: qty 2

## 2013-11-16 MED ORDER — HYDROMORPHONE HCL PF 1 MG/ML IJ SOLN
0.2500 mg | INTRAMUSCULAR | Status: DC | PRN
  Administered 2013-11-16: 0.25 mg via INTRAVENOUS
  Administered 2013-11-16: 0.5 mg via INTRAVENOUS
  Administered 2013-11-16: 0.25 mg via INTRAVENOUS

## 2013-11-16 MED ORDER — PHENYLEPHRINE HCL 10 MG/ML IJ SOLN
10.0000 mg | INTRAVENOUS | Status: DC | PRN
  Administered 2013-11-16: 40 ug/min via INTRAVENOUS

## 2013-11-16 MED ORDER — CALCIUM CARBONATE 600 MG PO TABS
600.0000 mg | ORAL_TABLET | Freq: Every day | ORAL | Status: DC
  Filled 2013-11-16: qty 1

## 2013-11-16 SURGICAL SUPPLY — 68 items
BENZOIN TINCTURE PRP APPL 2/3 (GAUZE/BANDAGES/DRESSINGS) ×3 IMPLANT
BIT DRILL QUICK RELEASE PRPHRL (DRILL) ×3 IMPLANT
BLADE SAW SGTL MED 73X18.5 STR (BLADE) ×3 IMPLANT
BOOTCOVER CLEANROOM LRG (PROTECTIVE WEAR) IMPLANT
BOWL SMART MIX CTS (DISPOSABLE) IMPLANT
BRUSH FEMORAL CANAL (MISCELLANEOUS) IMPLANT
CAP TOTAL SHOULDER ×3 IMPLANT
CEMENT BONE DEPUY (Cement) ×3 IMPLANT
CLOSURE STERI-STRIP 1/2X4 (GAUZE/BANDAGES/DRESSINGS)
CLOSURE WOUND 1/2 X4 (GAUZE/BANDAGES/DRESSINGS) ×1
CLSR STERI-STRIP ANTIMIC 1/2X4 (GAUZE/BANDAGES/DRESSINGS) IMPLANT
COVER SURGICAL LIGHT HANDLE (MISCELLANEOUS) ×3 IMPLANT
COVER TABLE BACK 60X90 (DRAPES) IMPLANT
DRAPE C-ARM 42X72 X-RAY (DRAPES) IMPLANT
DRAPE INCISE IOBAN 66X45 STRL (DRAPES) ×3 IMPLANT
DRAPE U-SHAPE 47X51 STRL (DRAPES) ×3 IMPLANT
DRILL QUICK RELEASE PERIPHERAL (DRILL) ×9
DRSG MEPILEX BORDER 4X8 (GAUZE/BANDAGES/DRESSINGS) ×3 IMPLANT
DRSG PAD ABDOMINAL 8X10 ST (GAUZE/BANDAGES/DRESSINGS) IMPLANT
DURAPREP 26ML APPLICATOR (WOUND CARE) ×3 IMPLANT
ELECT BLADE 6.5 EXT (BLADE) IMPLANT
ELECT NEEDLE TIP 2.8 STRL (NEEDLE) IMPLANT
ELECT REM PT RETURN 9FT ADLT (ELECTROSURGICAL) ×3
ELECTRODE REM PT RTRN 9FT ADLT (ELECTROSURGICAL) ×1 IMPLANT
EVACUATOR 1/8 PVC DRAIN (DRAIN) IMPLANT
FACESHIELD WRAPAROUND (MASK) IMPLANT
GAUZE SPONGE 4X4 12PLY STRL (GAUZE/BANDAGES/DRESSINGS) IMPLANT
GLOVE BIOGEL PI ORTHO PRO SZ8 (GLOVE) ×4
GLOVE ORTHO TXT STRL SZ7.5 (GLOVE) ×3 IMPLANT
GLOVE PI ORTHO PRO STRL SZ8 (GLOVE) ×2 IMPLANT
GLOVE SURG ORTHO 8.0 STRL STRW (GLOVE) ×6 IMPLANT
GOWN BRE IMP PREV XXLGXLNG (GOWN DISPOSABLE) ×3 IMPLANT
GOWN STRL REUS W/ TWL XL LVL3 (GOWN DISPOSABLE) ×2 IMPLANT
GOWN STRL REUS W/TWL XL LVL3 (GOWN DISPOSABLE) ×4
HANDPIECE INTERPULSE COAX TIP (DISPOSABLE) ×2
HOOD PEEL AWAY FACE SHEILD DIS (HOOD) ×9 IMPLANT
KIT BASIN OR (CUSTOM PROCEDURE TRAY) ×3 IMPLANT
KIT ROOM TURNOVER OR (KITS) ×3 IMPLANT
MANIFOLD NEPTUNE II (INSTRUMENTS) ×3 IMPLANT
NEEDLE 1/2 CIR CATGUT .05X1.09 (NEEDLE) ×3 IMPLANT
NEEDLE HYPO 25GX1X1/2 BEV (NEEDLE) IMPLANT
NS IRRIG 1000ML POUR BTL (IV SOLUTION) ×3 IMPLANT
PACK SHOULDER (CUSTOM PROCEDURE TRAY) ×3 IMPLANT
PAD ARMBOARD 7.5X6 YLW CONV (MISCELLANEOUS) ×6 IMPLANT
PIN HUMERAL STMN 3.2MMX9IN (INSTRUMENTS) ×3 IMPLANT
PIN STEINMANN THREADED TIP (PIN) ×9 IMPLANT
SET HNDPC FAN SPRY TIP SCT (DISPOSABLE) ×1 IMPLANT
SLING ARM IMMOBILIZER LRG (SOFTGOODS) IMPLANT
SLING ARM IMMOBILIZER MED (SOFTGOODS) ×3 IMPLANT
SMARTMIX MINI TOWER (MISCELLANEOUS) ×3
SPONGE LAP 18X18 X RAY DECT (DISPOSABLE) ×3 IMPLANT
STRIP CLOSURE SKIN 1/2X4 (GAUZE/BANDAGES/DRESSINGS) ×2 IMPLANT
SUCTION FRAZIER TIP 10 FR DISP (SUCTIONS) ×3 IMPLANT
SUPPORT WRAP ARM LG (MISCELLANEOUS) ×3 IMPLANT
SUT FIBERWIRE #2 38 REV NDL BL (SUTURE) ×15
SUT MNCRL AB 4-0 PS2 18 (SUTURE) IMPLANT
SUT VIC AB 0 CT1 27 (SUTURE) ×2
SUT VIC AB 0 CT1 27XBRD ANBCTR (SUTURE) ×1 IMPLANT
SUT VIC AB 2-0 CT1 27 (SUTURE)
SUT VIC AB 2-0 CT1 TAPERPNT 27 (SUTURE) IMPLANT
SUT VIC AB 3-0 SH 8-18 (SUTURE) ×3 IMPLANT
SUTURE FIBERWR#2 38 REV NDL BL (SUTURE) ×5 IMPLANT
SYR CONTROL 10ML LL (SYRINGE) IMPLANT
TOWEL OR 17X24 6PK STRL BLUE (TOWEL DISPOSABLE) ×3 IMPLANT
TOWEL OR 17X26 10 PK STRL BLUE (TOWEL DISPOSABLE) ×3 IMPLANT
TOWER SMARTMIX MINI (MISCELLANEOUS) ×1 IMPLANT
TRAY FOLEY CATH 16FRSI W/METER (SET/KITS/TRAYS/PACK) IMPLANT
WATER STERILE IRR 1000ML POUR (IV SOLUTION) IMPLANT

## 2013-11-16 NOTE — Anesthesia Preprocedure Evaluation (Addendum)
Anesthesia Evaluation  Patient identified by MRN, date of birth, ID band Patient awake    Reviewed: Allergy & Precautions, H&P , NPO status , Patient's Chart, lab work & pertinent test results, reviewed documented beta blocker date and time   Airway Mallampati: II TM Distance: >3 FB Neck ROM: full    Dental  (+) Dental Advisory Given, Edentulous Upper, Edentulous Lower   Pulmonary asthma , former smoker,          Cardiovascular hypertension, Pt. on home beta blockers + Peripheral Vascular Disease     Neuro/Psych  Headaches, Anxiety    GI/Hepatic negative GI ROS, Neg liver ROS,   Endo/Other  Hypothyroidism   Renal/GU negative Renal ROS     Musculoskeletal  (+) Arthritis -,   Abdominal   Peds  Hematology negative hematology ROS (+)   Anesthesia Other Findings   Reproductive/Obstetrics negative OB ROS                          Anesthesia Physical  Anesthesia Plan  ASA: II  Anesthesia Plan: General and Regional   Post-op Pain Management: MAC Combined w/ Regional for Post-op pain   Induction: Intravenous  Airway Management Planned: Oral ETT  Additional Equipment:   Intra-op Plan:   Post-operative Plan: Extubation in OR  Informed Consent: I have reviewed the patients History and Physical, chart, labs and discussed the procedure including the risks, benefits and alternatives for the proposed anesthesia with the patient or authorized representative who has indicated his/her understanding and acceptance.   Dental advisory given  Plan Discussed with: CRNA, Anesthesiologist and Surgeon  Anesthesia Plan Comments:         Anesthesia Quick Evaluation

## 2013-11-16 NOTE — Progress Notes (Signed)
Orthopedic Tech Progress Note Patient Details:  Danielle [name] [birth year] [id number] OHF not applied to patient's bed due to shoulder surgery. Patient unable to pull up with one arm.  Patient ID: Danielle [name], female   DOB: 05-04-1932, 80 y.o.   MRN: [id number]   Fenton Foy 11/16/2013, 12:45 PM

## 2013-11-16 NOTE — Progress Notes (Signed)
Utilization review completed.  

## 2013-11-16 NOTE — Op Note (Signed)
11/16/2013  9:51 AM  PATIENT:  Danielle [name]    PRE-OPERATIVE DIAGNOSIS:  Primary right shoulder osteoarthritis  POST-OPERATIVE DIAGNOSIS:  Same  PROCEDURE:  RIGHT TOTAL SHOULDER ARTHROPLASTY  SURGEON:  [name], MD  PHYSICIAN ASSISTANT: Joya Gaskins, OPA-C, present and scrubbed throughout the case, critical for completion in a timely fashion, and for retraction, instrumentation, and closure.  Second assistant: Mechele Claude, PA Student  ANESTHESIA:   General  PREOPERATIVE INDICATIONS:  Danielle [name] is a  80 y.o. female with a diagnosis of djd right shoulder who failed conservative measures and elected for surgical management.    The risks benefits and alternatives were discussed with the patient preoperatively including but not limited to the risks of infection, bleeding, nerve injury, cardiopulmonary complications, the need for revision surgery, dislocation, loosening, incomplete relief of pain, among others, and the patient was willing to proceed.   OPERATIVE IMPLANTS: Biomet size 11 mini press-fit humeral stem, size 42 +18 Versa-dial humeral head, set in the E position with increased coverage posteriorly, with a small cemented glenoid polyethylene 3 peg implant with a central regenerex noncemented post.   OPERATIVE FINDINGS: Advanced glenohumeral osteoarthritis involving the glenoid and the humeral head with substantial osteophyte formation inferiorly.   OPERATIVE PROCEDURE: The patient was brought to the operating room and placed in the supine position. General anesthesia was administered. IV antibiotics were given.  The upper extremity was prepped and draped in usual sterile fashion. The patient was in a beachchair position with all bony prominences padded.   Time out was performed and a deltopectoral approach was carried out. The biceps tendon was tenodesed to the pectoralis tendon. The subscapularis was released, tagging it with a #2 FiberWire, leaving a cuff of tendon  for repair.   The inferior osteophyte was removed, and release of the capsule off of the humeral side was completed. The head was dislocated, and I reamed sequentially. I placed the humeral cutting guide at 30 of retroversion, and then pinned this into place, and made my humeral neck cut. This was at the appropriate level.   I then placed deep retractors and exposed the glenoid. I excised the labrum circumferentially, taking care to protect the axillary nerve inferiorly.   I then placed a guidewire into the center position, controlling appropriate version and inclination. I then reamed over the guidewire with the small reamer, and was satisfied with the preparation. I preserved the subchondral bone in order to maximize the strength and minimize the risk for subsequent subsidence.   I then drilled the central hole for the regenerex peg, and then placed the guide, and then drilled the 3 peripheral peg holes. I had excellent bony circumferential contact. All 3 holes had a good bony endpoint, with the exception of the posterior hole which may have had some slight softening, just at the junction of the cortex.  I then cleaned the glenoid, irrigated it copiously, and then dried it and cemented the prosthesis into place. Excellent seating was achieved. I had full exposure. The cement cured, and then I turned my attention to the humeral side.   I sequentially broached, up to the selected size, with the broach set at 30 of retroversion. I then placed the real stem. I trialed with multiple heads, and the above-named component was selected. Increased posterior coverage improved the coverage. The soft tissue tension was appropriate.   I then impacted the real humeral head into place, reduced the head, and irrigated copiously. Excellent stability and range of  motion was achieved. I repaired the subscapularis with 4 #2 FiberWire, as well as the rotator interval, and irrigated copiously once more. The subcutaneous  tissue was closed with Vicryl including the deltopectoral fascia.   The skin was closed with Steri-Strips and sterile gauze was applied. She had a preoperative nerve block. She tolerated the procedure well and there were no complications.

## 2013-11-16 NOTE — H&P (Signed)
PREOPERATIVE H&P  Chief Complaint: djd right shoulder  HPI: Danielle [name] is a 80 y.o. female who presents for preoperative history and physical with a diagnosis of djd right shoulder. Symptoms are rated as moderate to severe, and have been worsening.  This is significantly impairing activities of daily living.  She has elected for surgical management.  She has failed injections, activity modification, anti-inflammatories.   Past Medical History  Diagnosis Date  . Dysrhythmia   . Heart murmur   . Peripheral vascular disease     carotid   . Asthma   . Seasonal allergies   . Hypothyroidism   . Headache(784.0)   . Arthritis     Right shoulder arthritis  . Osteoarthritis of left shoulder 07/20/2013  . Anxiety    Past Surgical History  Procedure Laterality Date  . Carotid endarterectomy Right   . Gastric restriction surgery Left 2006  . Tonsillectomy    . Colon surgery    . Eye surgery Bilateral     cataracts   . Colonoscopy    . Total shoulder arthroplasty Left 07/20/2013    Procedure: TOTAL SHOULDER ARTHROPLASTY;  Surgeon: [name], MD;  Location: [facility];  Service: Orthopedics;  Laterality: Left;  . Cataract extraction w/ intraocular lens  implant, bilateral     History   Social History  . Marital Status: Married    Spouse Name: N/A    Number of Children: N/A  . Years of Education: N/A   Social History Main Topics  . Smoking status: Former Smoker -- 0.25 packs/day for 2 years    Types: Cigarettes    Quit date: 07/13/1963  . Smokeless tobacco: Never Used     Comment: quit aprrox 50 years ago.  . Alcohol Use: No  . Drug Use: No  . Sexual Activity: None   Other Topics Concern  . None   Social History Narrative  . None   Family History  Problem Relation Age of Onset  . Cancer - Lung Mother   . Cancer - Other Other    No Known Allergies Prior to Admission medications   Medication Sig Start Date End Date Taking? Authorizing Provider  alendronate  (FOSAMAX) 70 MG tablet Take 70 mg by mouth once a week. On Sunday 09/07/12  Yes Historical Provider, MD  aspirin EC 81 MG tablet Take 81 mg by mouth daily.   Yes Historical Provider, MD  calcium carbonate (OS-CAL) 600 MG TABS tablet Take 600 mg by mouth daily with breakfast.    Yes Historical Provider, MD  cetirizine (ZYRTEC) 10 MG tablet Take 10 mg by mouth daily.   Yes Historical Provider, MD  cholecalciferol (VITAMIN D) 1000 UNITS tablet Take 1,000 Units by mouth daily.    Yes Historical Provider, MD  fluticasone (FLONASE) 50 MCG/ACT nasal spray Place 1 spray into both nostrils daily as needed for allergies.  04/28/13  Yes Historical Provider, MD  guaiFENesin (MUCINEX) 600 MG 12 hr tablet Take 1,200 mg by mouth 2 (two) times daily as needed for to loosen phlegm.    Yes Historical Provider, MD  levothyroxine (SYNTHROID, LEVOTHROID) 125 MCG tablet Take 125 mcg by mouth daily before breakfast.   Yes Historical Provider, MD  Multiple Vitamin (MULTIVITAMIN WITH MINERALS) TABS tablet Take 1 tablet by mouth daily.   Yes Historical Provider, MD  nebivolol (BYSTOLIC) 10 MG tablet Take 10 mg by mouth daily.   Yes Historical Provider, MD  OVER THE COUNTER MEDICATION Take 1 capsule by  mouth daily. Mega red-3   Yes Historical Provider, MD  polyethylene glycol (MIRALAX / GLYCOLAX) packet Take 17 g by mouth daily as needed for moderate constipation.   Yes Historical Provider, MD  albuterol (PROVENTIL HFA;VENTOLIN HFA) 108 (90 BASE) MCG/ACT inhaler Inhale 1-2 puffs into the lungs every 6 (six) hours as needed for wheezing or shortness of breath.    Historical Provider, MD  ALPRAZolam Duanne Moron) 0.5 MG tablet Take 0.5 mg by mouth at bedtime as needed for sleep.     Historical Provider, MD     Positive ROS: All other systems have been reviewed and were otherwise negative with the exception of those mentioned in the HPI and as above.  Physical Exam: General: Alert, no acute distress Cardiovascular: No pedal  edema Respiratory: No cyanosis, no use of accessory musculature GI: No organomegaly, abdomen is soft and non-tender Skin: No lesions in the area of chief complaint Neurologic: Sensation intact distally Psychiatric: Patient is competent for consent with normal mood and affect Lymphatic: No axillary or cervical lymphadenopathy  MUSCULOSKELETAL: right shoulder AROM 0-110, positive crepitance and pain, ER 0  XR w osteophytes and end stage loss of joint space obliteration, sclerosis.  Assessment: djd right shoulder  Plan: Plan for Procedure(s): RIGHT TOTAL SHOULDER ARTHROPLASTY  The risks benefits and alternatives were discussed with the patient including but not limited to the risks of nonoperative treatment, versus surgical intervention including infection, bleeding, nerve injury,  blood clots, cardiopulmonary complications, morbidity, mortality, among others, and they were willing to proceed.   [name], MD Cell [phone]   11/16/2013 7:18 AM

## 2013-11-16 NOTE — Discharge Instructions (Signed)
Diet: As you were doing prior to hospitalization   Shower:  May shower but keep the wounds dry, use an occlusive plastic wrap, NO SOAKING IN TUB.  If the bandage gets wet, change with a clean dry gauze.  Dressing:  You may change your dressing 3-5 days after surgery.  Then change the dressing daily with sterile gauze dressing.    There are sticky tapes (steri-strips) on your wounds and all the stitches are absorbable.  Leave the steri-strips in place when changing your dressings, they will peel off with time, usually 2-3 weeks.  Activity:  Increase activity slowly as tolerated, but follow the weight bearing instructions below.  No lifting or driving for 6 weeks.  Weight Bearing:   Sling at all times except for hygiene. .    To prevent constipation: you may use a stool softener such as -  Colace (over the counter) 100 mg by mouth twice a day  Drink plenty of fluids (prune juice may be helpful) and high fiber foods Miralax (over the counter) for constipation as needed.    Itching:  If you experience itching with your medications, try taking only a single pain pill, or even half a pain pill at a time.  You may take up to 10 pain pills per day, and you can also use benadryl over the counter for itching or also to help with sleep.   Precautions:  If you experience chest pain or shortness of breath - call 911 immediately for transfer to the hospital emergency department!!  If you develop a fever greater that 101 F, purulent drainage from wound, increased redness or drainage from wound, or calf pain -- Call the office at [phone]                                                Follow- Up Appointment:  Please call for an appointment to be seen in 2 weeks [place] - [phone]

## 2013-11-16 NOTE — Anesthesia Procedure Notes (Addendum)
Anesthesia Regional Block:  Interscalene brachial plexus block  Pre-Anesthetic Checklist: ,, timeout performed, Correct Patient, Correct Site, Correct Laterality, Correct Procedure, Correct Position, site marked, Risks and benefits discussed,  Surgical consent,  Pre-op evaluation,  At surgeon's request and post-op pain management  Laterality: Right  Prep: chloraprep       Needles:  Injection technique: Single-shot  Needle Type: Echogenic Needle     Needle Length: 5cm 5 cm Needle Gauge: 21 and 21 G    Additional Needles:  Procedures: ultrasound guided (picture in chart) Interscalene brachial plexus block Narrative:  Start time: 11/16/2013 7:20 AM End time: 11/16/2013 7:28 AM Injection made incrementally with aspirations every 5 mL.  Performed by: Personally  Anesthesiologist: Suzette Battiest, MD  Additional Notes: Deltoid response noted at 0.22mA and disappeared at 0.51mA. Needle advanced and positioned under live u/s guidance. Dosed with 25cc's 0.5% bupi with 1:200k epi.   Procedure Name: Intubation Date/Time: 11/16/2013 7:50 AM Performed by: Terrill Mohr Pre-anesthesia Checklist: Patient identified, Emergency Drugs available, Suction available and Patient being monitored Patient Re-evaluated:Patient Re-evaluated prior to inductionOxygen Delivery Method: Circle system utilized Preoxygenation: Pre-oxygenation with 100% oxygen Intubation Type: IV induction Ventilation: Mask ventilation without difficulty Laryngoscope Size: Mac and 3 Grade View: Grade I Tube type: Oral Tube size: 7.5 mm Number of attempts: 1 Airway Equipment and Method: Stylet Placement Confirmation: ETT inserted through vocal cords under direct vision,  breath sounds checked- equal and bilateral and positive ETCO2 Secured at: 21 (cm at gum) cm Tube secured with: Tape Dental Injury: Teeth and Oropharynx as per pre-operative assessment

## 2013-11-16 NOTE — Transfer of Care (Signed)
Immediate Anesthesia Transfer of Care Note  Patient: Danielle [name]  Procedure(s) Performed: Procedure(s): RIGHT TOTAL SHOULDER ARTHROPLASTY (Right)  Patient Location: PACU  Anesthesia Type:General  Level of Consciousness: awake, sedated and patient cooperative  Airway & Oxygen Therapy: Patient Spontanous Breathing and Patient connected to nasal cannula oxygen  Post-op Assessment: Report given to PACU RN and Post -op Vital signs reviewed and stable  Post vital signs: Reviewed and stable  Complications: No apparent anesthesia complications

## 2013-11-16 NOTE — Anesthesia Postprocedure Evaluation (Signed)
  Anesthesia Post-op Note  Patient: Danielle [name]  Procedure(s) Performed: Procedure(s): RIGHT TOTAL SHOULDER ARTHROPLASTY (Right)  Patient Location: PACU  Anesthesia Type:GA combined with regional for post-op pain  Level of Consciousness: awake, alert  and oriented  Airway and Oxygen Therapy: Patient Spontanous Breathing  Post-op Pain: none  Post-op Assessment: Post-op Vital signs reviewed  Post-op Vital Signs: Reviewed  Last Vitals:  Filed Vitals:   11/16/13 1240  BP: 117/65  Pulse: 68  Temp: 36.4 C  Resp: 14    Complications: No apparent anesthesia complications

## 2013-11-17 LAB — CBC
HEMATOCRIT: 42.4 % (ref 36.0–46.0)
Hemoglobin: 14 g/dL (ref 12.0–15.0)
MCH: 30.2 pg (ref 26.0–34.0)
MCHC: 33 g/dL (ref 30.0–36.0)
MCV: 91.4 fL (ref 78.0–100.0)
Platelets: 198 10*3/uL (ref 150–400)
RBC: 4.64 MIL/uL (ref 3.87–5.11)
RDW: 14.1 % (ref 11.5–15.5)
WBC: 10.7 10*3/uL — ABNORMAL HIGH (ref 4.0–10.5)

## 2013-11-17 LAB — BASIC METABOLIC PANEL
Anion gap: 15 (ref 5–15)
BUN: 10 mg/dL (ref 6–23)
CHLORIDE: 99 meq/L (ref 96–112)
CO2: 20 meq/L (ref 19–32)
Calcium: 9 mg/dL (ref 8.4–10.5)
Creatinine, Ser: 0.73 mg/dL (ref 0.50–1.10)
GFR calc Af Amer: >90 mL/min (ref >90)
GFR calc non Af Amer: 79 mL/min — ABNORMAL LOW (ref >90)
GLUCOSE: 99 mg/dL (ref 70–99)
POTASSIUM: 4.5 meq/L (ref 3.7–5.3)
Sodium: 134 mEq/L — ABNORMAL LOW (ref 137–147)

## 2013-11-17 NOTE — Evaluation (Signed)
Physical Therapy Evaluation Patient Details Name: Danielle [name] MRN: [id number] DOB: 1932/09/10 Today's Date: 11/17/2013   History of Present Illness  s/p R TSA  Clinical Impression  Patient is s/p R TSA surgery resulting in functional limitations due to the deficits listed below (see PT Problem List). Eval limited this AM due to nausea and dizziness, supected to be due to medication. Patient will benefit from skilled PT to increase their independence and safety with mobility to allow discharge to the venue listed below. Pt with MD orders for d/c home today.  Pt will be safe for d/c if nausea clears and she is able to ambulate increased distances safely.  Recommend HHPT upon d/c.  She has all needed DME.      Follow Up Recommendations Home health PT;Supervision/Assistance - 24 hour    Equipment Recommendations  None recommended by PT    Recommendations for Other Services       Precautions / Restrictions Precautions Precautions: Fall Required Braces or Orthoses: Sling Restrictions Weight Bearing Restrictions: Yes RUE Weight Bearing: Non weight bearing      Mobility  Bed Mobility Overal bed mobility: Needs Assistance Bed Mobility: Supine to Sit     Supine to sit: Min assist     General bed mobility comments: assist with trunk, verbal cues for sequencing  Transfers Overall transfer level: Needs assistance Equipment used: None Transfers: Sit to/from [employer] Transfers Sit to Stand: Min assist Stand pivot transfers: Min assist       General transfer comment: verbal cues for safety; pt unsteady with decreased safety awareness, suspect due to medication  Ambulation/Gait Ambulation/Gait assistance: Min assist Ambulation Distance (Feet): 5 Feet Assistive device: None Gait Pattern/deviations: WFL(Within Functional Limits)     General Gait Details: unsteady, distance limited by nausea  Stairs            Wheelchair Mobility    Modified Rankin (Stroke  Patients Only)       Balance                                             Pertinent Vitals/Pain Pain Assessment: Faces Faces Pain Scale: Hurts little more Pain Intervention(s): Monitored during session    Home Living Family/patient expects to be discharged to:: Private residence Living Arrangements: Spouse/significant other Available Help at Discharge: Available 24 hours/day;Family Type of Home: House Home Access: Stairs to enter Entrance Stairs-Rails: None Entrance Stairs-Number of Steps: 3 Home Layout: Two level Home Equipment: Cane - single point;Crutches;Walker - 2 wheels      Prior Function Level of Independence: Independent               Hand Dominance        Extremity/Trunk Assessment   Upper Extremity Assessment: Defer to OT evaluation           Lower Extremity Assessment: Overall WFL for tasks assessed         Communication   Communication: No difficulties  Cognition Arousal/Alertness: Suspect due to medications;Lethargic Behavior During Therapy: Impulsive Overall Cognitive Status: Impaired/Different from baseline Area of Impairment: Safety/judgement;Problem solving     Memory: Decreased short-term memory   Safety/Judgement: Decreased awareness of safety   Problem Solving: Difficulty sequencing;Requires verbal cues      General Comments      Exercises        Assessment/Plan    PT Assessment  Patient needs continued PT services  PT Diagnosis Difficulty walking;Acute pain   PT Problem List Decreased activity tolerance;Decreased balance;Decreased mobility;Pain;Decreased safety awareness  PT Treatment Interventions DME instruction;Gait training;Stair training;Functional mobility training;Therapeutic activities;Patient/family education;Balance training   PT Goals (Current goals can be found in the Care Plan section) Acute Rehab PT Goals Patient Stated Goal: home today PT Goal Formulation: With  patient/family Time For Goal Achievement: 11/24/13 Potential to Achieve Goals: Good    Frequency Min 5X/week   Barriers to discharge        Co-evaluation               End of Session Equipment Utilized During Treatment: Gait belt Activity Tolerance: Treatment limited secondary to medical complications (Comment) (nausea) Patient left: in chair;with call bell/phone within reach;with family/visitor present Nurse Communication: Mobility status         Time: 1021-1173 PT Time Calculation (min): 19 min   Charges:   PT Evaluation $Initial PT Evaluation Tier I: 1 Procedure PT Treatments $Therapeutic Activity: 8-22 mins   PT G Codes:          Danielle [name] 11/17/2013, 10:23 AM  Lorrin Goodell, PT  Office # [phone] Pager [phone]

## 2013-11-17 NOTE — Discharge Summary (Addendum)
Physician Discharge Summary  Patient ID: Danielle [name] MRN: [id number] DOB/AGE: 11-Oct-1932 80 y.o.  Admit date: 11/16/2013 Discharge date: 11/18/2013  Admission Diagnoses:  Primary osteoarthritis of right shoulder  Discharge Diagnoses:  Principal Problem:   Primary osteoarthritis of right shoulder Active Problems:   Primary localized osteoarthrosis, shoulder region   Past Medical History  Diagnosis Date  . Dysrhythmia   . Heart murmur   . Peripheral vascular disease     carotid   . Asthma   . Seasonal allergies   . Hypothyroidism   . Headache(784.0)   . Arthritis     Right shoulder arthritis  . Osteoarthritis of left shoulder 07/20/2013  . Anxiety   . Primary osteoarthritis of right shoulder 11/16/2013    Surgeries: Procedure(s): RIGHT TOTAL SHOULDER ARTHROPLASTY on 11/16/2013   Consultants (if any):    Discharged Condition: Improved  Hospital Course: IOANNA COLQUHOUN is an 80 y.o. female who was admitted 11/16/2013 with a diagnosis of Primary osteoarthritis of right shoulder and went to the operating room on 11/16/2013 and underwent the above named procedures.  She had some nausea and disorientation and confusion POD 1 with difficulty with ambulation and was monitored and given PT and nausea medications, planned dc on POD 2.  She was given perioperative antibiotics:  Anti-infectives   Start     Dose/Rate Route Frequency Ordered Stop   11/16/13 1400  ceFAZolin (ANCEF) IVPB 1 g/50 mL premix     1 g 100 mL/hr over 30 Minutes Intravenous Every 6 hours 11/16/13 1217 11/17/13 0224   11/16/13 0600  ceFAZolin (ANCEF) IVPB 2 g/50 mL premix     2 g 100 mL/hr over 30 Minutes Intravenous On call to O.R. 11/15/13 1356 11/16/13 0757   11/16/13 0600  ceFAZolin (ANCEF) IVPB 2 g/50 mL premix  Status:  Discontinued     2 g 100 mL/hr over 30 Minutes Intravenous On call to O.R. 11/15/13 1405 11/15/13 1405    .  She was given sequential compression devices, early ambulation for DVT  prophylaxis.  She benefited maximally from the hospital stay and there were no complications.    Recent vital signs:  Filed Vitals:   11/17/13 0524  BP: 124/58  Pulse: 111  Temp: 97.7 F (36.5 C)  Resp: 16    Recent laboratory studies:  Lab Results  Component Value Date   HGB 14.0 11/17/2013   HGB 14.5 11/08/2013   HGB 11.8* 07/21/2013   Lab Results  Component Value Date   WBC 10.7* 11/17/2013   PLT 198 11/17/2013   No results found for this basename: INR   Lab Results  Component Value Date   NA 134* 11/17/2013   K 4.5 11/17/2013   CL 99 11/17/2013   CO2 20 11/17/2013   BUN 10 11/17/2013   CREATININE 0.73 11/17/2013   GLUCOSE 99 11/17/2013    Discharge Medications:     Medication List         albuterol 108 (90 BASE) MCG/ACT inhaler  Commonly known as:  PROVENTIL HFA;VENTOLIN HFA  Inhale 1-2 puffs into the lungs every 6 (six) hours as needed for wheezing or shortness of breath.     alendronate 70 MG tablet  Commonly known as:  FOSAMAX  Take 70 mg by mouth once a week. On Sunday     ALPRAZolam 0.5 MG tablet  Commonly known as:  XANAX  Take 0.5 mg by mouth at bedtime as needed for sleep.     aspirin EC  81 MG tablet  Take 81 mg by mouth daily.     baclofen 10 MG tablet  Commonly known as:  LIORESAL  Take 1 tablet (10 mg total) by mouth 3 (three) times daily. As needed for muscle spasm     calcium carbonate 600 MG Tabs tablet  Commonly known as:  OS-CAL  Take 600 mg by mouth daily with breakfast.     cetirizine 10 MG tablet  Commonly known as:  ZYRTEC  Take 10 mg by mouth daily.     cholecalciferol 1000 UNITS tablet  Commonly known as:  VITAMIN D  Take 1,000 Units by mouth daily.     fluticasone 50 MCG/ACT nasal spray  Commonly known as:  FLONASE  Place 1 spray into both nostrils daily as needed for allergies.     guaiFENesin 600 MG 12 hr tablet  Commonly known as:  MUCINEX  Take 1,200 mg by mouth 2 (two) times daily as needed for to loosen phlegm.      HYDROcodone-acetaminophen 10-325 MG per tablet  Commonly known as:  NORCO  Take 1-2 tablets by mouth every 6 (six) hours as needed.     levothyroxine 125 MCG tablet  Commonly known as:  SYNTHROID, LEVOTHROID  Take 125 mcg by mouth daily before breakfast.     multivitamin with minerals Tabs tablet  Take 1 tablet by mouth daily.     nebivolol 10 MG tablet  Commonly known as:  BYSTOLIC  Take 10 mg by mouth daily.     ondansetron 4 MG tablet  Commonly known as:  ZOFRAN  Take 1 tablet (4 mg total) by mouth every 8 (eight) hours as needed for nausea or vomiting.     OVER THE COUNTER MEDICATION  Take 1 capsule by mouth daily. Mega red-3     polyethylene glycol packet  Commonly known as:  MIRALAX / GLYCOLAX  Take 17 g by mouth daily as needed for moderate constipation.     sennosides-docusate sodium 8.6-50 MG tablet  Commonly known as:  SENOKOT-S  Take 2 tablets by mouth daily.        Diagnostic Studies: Dg Shoulder Right Port  12/02/2013   CLINICAL DATA:  Postop shoulder replacement.  EXAM: PORTABLE RIGHT SHOULDER - 2+ VIEW  COMPARISON:  CT 09/06/2013  FINDINGS: The patient has undergone right shoulder arthroplasty. Acetabular screw has been placed. No evidence for dislocation or new fracture on this single view. Right lung apex is clear.  IMPRESSION: Status post shoulder arthroplasty.  No adverse features identified.   Electronically Signed   By: Shon Hale M.D.   On: December 02, 2013 12:00    Disposition: 01-Home or Self Care        Follow-up Information   Follow up with [name], MD. Schedule an appointment as soon as possible for a visit in 2 weeks.   Specialty:  Orthopedic Surgery   Contact information:   [place] [address] [phone]        Signed: [name] 11/17/2013, 8:48 AM

## 2013-11-17 NOTE — Evaluation (Addendum)
Occupational Therapy Evaluation Patient Details Name: Danielle [name] MRN: [id number] DOB: 1932-12-16 Today's Date: 11/17/2013    History of Present Illness s/p R TSA   Clinical Impression   Pt admitted with the above diagnoses and presents with below problem list. Pt will benefit from continued acute OT to address the below listed deficits and maximize independence with basic ADLs prior to d/c home with spouse. PTA pt was independent with ADLs. Pt currently at mod-max A level with ADLs. Cognition impaired/different from baseline this session impacting level of independence (see notes below). Nursing and MD aware of cognition status. Acute OT to continue to follow and treat as indicated. Per telephone with readback order pt ok for AROM elbow, wrist, hand; sling on at all times except bathing/dressing/exercise.     Follow Up Recommendations  Supervision/Assistance - 24 hour;No OT follow up    Equipment Recommendations  None recommended by OT    Recommendations for Other Services       Precautions / Restrictions Precautions Precautions: Fall;Shoulder Type of Shoulder Precautions: NWB, no shoulder movement Shoulder Interventions: Shoulder sling/immobilizer;Off for dressing/bathing/exercises Precaution Booklet Issued: Yes (comment) Precaution Comments: reviewed precautions Required Braces or Orthoses: Sling Restrictions Weight Bearing Restrictions: Yes RUE Weight Bearing: Non weight bearing      Mobility Bed Mobility Overal bed mobility: Needs Assistance Bed Mobility: Supine to Sit     Supine to sit: Min assist     General bed mobility comments: pt in recliner  Transfers Overall transfer level: Needs assistance Equipment used: 1 person hand held assist;None Transfers: Sit to/from [facility] Sit to Stand: Min assist Stand pivot transfers: Min assist       General transfer comment: cues for safety, unsteady    Balance Overall balance assessment: Needs  assistance Sitting-balance support: No upper extremity supported;Feet supported Sitting balance-Leahy Scale: Fair     Standing balance support: Single extremity supported;During functional activity Standing balance-Leahy Scale: Fair Standing balance comment: pt needing 1 person hand held assist at times to maintain balance, suspect med-related                            ADL Overall ADL's : Needs assistance/impaired Eating/Feeding: Set up;Sitting   Grooming: Set up;Sitting;Cueing for compensatory techniques   Upper Body Bathing: Moderate assistance;Sitting;Cueing for UE precautions;Cueing for safety;Cueing for compensatory techniques   Lower Body Bathing: Maximal assistance;Sit to/from stand   Upper Body Dressing : Maximal assistance;Cueing for UE precautions;Cueing for compensatory techniques;Sitting   Lower Body Dressing: Maximal assistance;Sit to/from stand;Cueing for compensatory techniques   Toilet Transfer: Minimal assistance;Stand-pivot;BSC   Toileting- Clothing Manipulation and Hygiene: Maximal assistance;Sit to/from stand;Cueing for safety       Functional mobility during ADLs: Minimal assistance;Cueing for safety General ADL Comments: Pt stand pivot to [facility] with min A and cueing for sequencting. Cognition impaired from baseline impacting ADL performace at this time. Pt with loss of urine in recliner just prior to stand pivot to [facility].      Vision                     Perception     Praxis      Pertinent Vitals/Pain Pain Assessment: Faces Pain Score: 5  Faces Pain Scale: Hurts little more Pain Location: RUE Pain Descriptors / Indicators: Aching;Constant Pain Intervention(s): Limited activity within patient's tolerance;Monitored during session;Repositioned     Hand Dominance     Extremity/Trunk Assessment Upper Extremity Assessment  Upper Extremity Assessment: LUE deficits/detail LUE Deficits / Details: s/p TSA LUE: Unable to fully assess  due to immobilization   Lower Extremity Assessment Lower Extremity Assessment: Defer to PT evaluation       Communication Communication Communication: Other (comment) (pt unable to answer most questions, tangential responses)   Cognition Arousal/Alertness: Suspect due to medications;Lethargic Behavior During Therapy: Impulsive Overall Cognitive Status: Impaired/Different from baseline Area of Impairment: Safety/judgement;Problem solving;Memory     Memory: Decreased short-term memory   Safety/Judgement: Decreased awareness of safety   Problem Solving: Difficulty sequencing;Requires verbal cues;Requires tactile cues     General Comments       Exercises       Shoulder Instructions      Home Living Family/patient expects to be discharged to:: Private residence Living Arrangements: Spouse/significant other Available Help at Discharge: Available 24 hours/day;Family Type of Home: House Home Access: Stairs to enter [employer] of Steps: 3 Entrance Stairs-Rails: None Home Layout: Two level Alternate Level Stairs-Number of Steps: 12 Alternate Level Stairs-Rails: Left           Home Equipment: Cane - single point;Crutches;Walker - 2 wheels          Prior Functioning/Environment Level of Independence: Independent             OT Diagnosis: Acute pain   OT Problem List: Impaired balance (sitting and/or standing);Decreased cognition;Decreased strength;Decreased range of motion;Decreased safety awareness;Decreased knowledge of use of DME or AE;Decreased knowledge of precautions;Impaired UE functional use;Pain   OT Treatment/Interventions: Self-care/ADL training;Therapeutic exercise;DME and/or AE instruction;Therapeutic activities;Patient/family education;Balance training    OT Goals(Current goals can be found in the care plan section) Acute Rehab OT Goals Patient Stated Goal: not stated OT Goal Formulation: With patient/family Time For Goal Achievement:  11/24/13 Potential to Achieve Goals: Good ADL Goals Pt Will Perform Upper Body Bathing: sitting;with min guard assist Pt Will Perform Lower Body Bathing: with min assist;sit to/from stand;with adaptive equipment Pt Will Perform Upper Body Dressing: with min assist;sitting Pt Will Perform Lower Body Dressing: with min assist;sit to/from stand Pt Will Transfer to Toilet: with min guard assist;ambulating;bedside commode Pt Will Perform Toileting - Clothing Manipulation and hygiene: with min guard assist;sit to/from stand Pt Will Perform Tub/Shower Transfer: with min guard assist;ambulating;3 in 1 Pt/caregiver will Perform Home Exercise Program: With Supervision;With written HEP provided;Right Upper extremity  OT Frequency: Min 3X/week   Barriers to D/C:            Co-evaluation              End of Session Equipment Utilized During Treatment: Gait belt;Rolling walker Nurse Communication: Other (comment);Mobility status (loss of urine during session; cognitive status)  Activity Tolerance: Patient limited by fatigue;Other (comment) (Pt limited by impaired cognition this session.) Patient left: in chair;with call bell/phone within reach;with family/visitor present;Other (comment) (with MD in room)   Time: 1205-1230 OT Time Calculation (min): 25 min Charges:  OT General Charges $OT Visit: 1 Procedure OT Evaluation $Initial OT Evaluation Tier I: 1 Procedure OT Treatments $Self Care/Home Management : 8-22 mins G-Codes:    Hortencia Pilar Dec 15, 2013, 12:52 PM

## 2013-11-17 NOTE — Progress Notes (Addendum)
Patient ID: Danielle [name], female   DOB: [birth year], 80 y.o.   MRN: [id number]     Subjective:  Patient reports pain as mild.  Patient wants to go home today given that she has already been through the process before with the other arm, but doesn't quite feel steady on her feet, has nausea, and mild confusion.  Objective:   VITALS:   Filed Vitals:   11/16/13 1400 11/16/13 2154 11/17/13 0102 11/17/13 0524  BP: 118/66 120/68 116/57 124/58  Pulse:  69 77 111  Temp: 97.7 F (36.5 C) 97.6 F (36.4 C) 99.1 F (37.3 C) 97.7 F (36.5 C)  TempSrc:      Resp: 16 16 16 16   Height:      Weight:      SpO2: 97% 98% 90% 90%    ABD soft Sensation intact distally Dorsiflexion/Plantar flexion intact Incision: dressing C/D/I and no drainage Good wrist and hand function  Lab Results  Component Value Date   WBC 10.7* 11/17/2013   HGB 14.0 11/17/2013   HCT 42.4 11/17/2013   MCV 91.4 11/17/2013   PLT 198 11/17/2013   BMET    Component Value Date/Time   NA 134* 11/17/2013 0500   K 4.5 11/17/2013 0500   CL 99 11/17/2013 0500   CO2 20 11/17/2013 0500   GLUCOSE 99 11/17/2013 0500   BUN 10 11/17/2013 0500   CREATININE 0.73 11/17/2013 0500   CALCIUM 9.0 11/17/2013 0500   GFRNONAA 79* 11/17/2013 0500   GFRAA >90 11/17/2013 0500     Assessment/Plan: 1 Day Post-Op   Principal Problem:   Primary osteoarthritis of right shoulder Active Problems:   Primary localized osteoarthrosis, shoulder region   Advance diet Up with therapy Discharge home with home health possibly tomorrow. Sling at all times  NWB right upper ext Dry dressing PRN Follow up with Dr [name] in Two weeks   [name] 11/17/2013, 7:50 AM  Seen and agree, except she is not feeling well enough to go home, with nausea, confusion, and difficulty ambulating.  Will hold dc until tomorrow and have her work with PT, get nausea meds, and reassess tomorrow.  Marchia Bond, MD Cell [phone]

## 2013-11-18 ENCOUNTER — Encounter (HOSPITAL_COMMUNITY): Payer: Self-pay | Admitting: Orthopedic Surgery

## 2013-11-18 ENCOUNTER — Inpatient Hospital Stay (HOSPITAL_COMMUNITY): Payer: Medicare Other

## 2013-11-18 DIAGNOSIS — I10 Essential (primary) hypertension: Secondary | ICD-10-CM

## 2013-11-18 DIAGNOSIS — M19019 Primary osteoarthritis, unspecified shoulder: Principal | ICD-10-CM

## 2013-11-18 DIAGNOSIS — G9340 Encephalopathy, unspecified: Secondary | ICD-10-CM | POA: Diagnosis present

## 2013-11-18 DIAGNOSIS — E039 Hypothyroidism, unspecified: Secondary | ICD-10-CM

## 2013-11-18 LAB — CBC
HCT: 38.7 % (ref 36.0–46.0)
HEMOGLOBIN: 13.1 g/dL (ref 12.0–15.0)
MCH: 29.8 pg (ref 26.0–34.0)
MCHC: 33.9 g/dL (ref 30.0–36.0)
MCV: 88 fL (ref 78.0–100.0)
PLATELETS: 172 10*3/uL (ref 150–400)
RBC: 4.4 MIL/uL (ref 3.87–5.11)
RDW: 13.6 % (ref 11.5–15.5)
WBC: 8.8 10*3/uL (ref 4.0–10.5)

## 2013-11-18 LAB — URINALYSIS, ROUTINE W REFLEX MICROSCOPIC
Bilirubin Urine: NEGATIVE
GLUCOSE, UA: NEGATIVE mg/dL
Ketones, ur: NEGATIVE mg/dL
LEUKOCYTES UA: NEGATIVE
Nitrite: NEGATIVE
Protein, ur: NEGATIVE mg/dL
SPECIFIC GRAVITY, URINE: 1.006 (ref 1.005–1.030)
Urobilinogen, UA: 0.2 mg/dL (ref 0.0–1.0)
pH: 6.5 (ref 5.0–8.0)

## 2013-11-18 LAB — COMPREHENSIVE METABOLIC PANEL
ALT: 15 U/L (ref 0–35)
AST: 29 U/L (ref 0–37)
Albumin: 3.3 g/dL — ABNORMAL LOW (ref 3.5–5.2)
Alkaline Phosphatase: 81 U/L (ref 39–117)
Anion gap: 12 (ref 5–15)
BUN: 12 mg/dL (ref 6–23)
CALCIUM: 8.9 mg/dL (ref 8.4–10.5)
CO2: 24 mEq/L (ref 19–32)
CREATININE: 0.81 mg/dL (ref 0.50–1.10)
Chloride: 100 mEq/L (ref 96–112)
GFR calc Af Amer: 78 mL/min — ABNORMAL LOW (ref >90)
GFR, EST NON AFRICAN AMERICAN: 67 mL/min — AB (ref >90)
GLUCOSE: 110 mg/dL — AB (ref 70–99)
Potassium: 3.9 mEq/L (ref 3.7–5.3)
SODIUM: 136 meq/L — AB (ref 137–147)
TOTAL PROTEIN: 6.8 g/dL (ref 6.0–8.3)
Total Bilirubin: 1.6 mg/dL — ABNORMAL HIGH (ref 0.3–1.2)

## 2013-11-18 LAB — URINE MICROSCOPIC-ADD ON

## 2013-11-18 MED ORDER — ACETAMINOPHEN 325 MG PO TABS
650.0000 mg | ORAL_TABLET | Freq: Three times a day (TID) | ORAL | Status: DC
  Administered 2013-11-19: 650 mg via ORAL
  Filled 2013-11-18: qty 2

## 2013-11-18 NOTE — Progress Notes (Signed)
Occupational Therapy Treatment Patient Details Name: Danielle [name] MRN: [id number] DOB: 1933-01-19 Today's Date: 11/18/2013    History of present illness s/p R TSA   OT comments  Pt continues to be confused but improved per RN.  Pt seeing rats in room.  Follow Up Recommendations  Supervision/Assistance - 24 hour;No OT follow up;Home health OT    Equipment Recommendations  None recommended by OT       Precautions / Restrictions Precautions Precautions: Fall;Shoulder Type of Shoulder Precautions: NWB Shoulder Interventions: Shoulder sling/immobilizer;Off for dressing/bathing/exercises Precaution Booklet Issued: Yes (comment) Precaution Comments: reviewed precautions Required Braces or Orthoses: Sling Restrictions RUE Weight Bearing: Non weight bearing       Mobility Bed Mobility         Supine to sit: Min guard     General bed mobility comments: verbal cues to maintain NWB RUE  Transfers   Equipment used: 1 person hand held assist   Sit to Stand: Min guard Stand pivot transfers: Min guard       General transfer comment: verbal/tactile cues for safety.  VC to not use RUE!        ADL                   Upper Body Dressing : Standing;Cueing for UE precautions;Cueing for safety;Cueing for sequencing;Moderate assistance                     General ADL Comments: pt needed MAX VC to not use RUE during donning sling.  Granddaugther present for end of OT session                Cognition   Behavior During Therapy: Impulsive Overall Cognitive Status: Impaired/Different from baseline Area of Impairment: Safety/judgement;Problem solving;Memory;Orientation Orientation Level: Disoriented to;Place;Situation   Memory: Decreased recall of precautions;Decreased short-term memory    Safety/Judgement: Decreased awareness of safety   Problem Solving: Difficulty sequencing;Requires verbal cues;Requires tactile cues General Comments: pt seeing rats  on floor. explained to pt rats not real      Exercises  hand and wrist ROM only   Shoulder Instructions  MAX VC to not use RUE.  Pt attempted to use for sit to stand as well as to reach back for chair     General Comments  family present for end of OT session    Pertinent Vitals/ Pain       Pain Assessment: No/denies pain Faces Pain Scale: Hurts a little bit Pain Location: RUE Pain Intervention(s): Monitored during session         Frequency Min 2X/week     Progress Toward Goals  OT Goals(current goals can now be found in the care plan section)  Progress towards OT goals: Progressing toward goals      End of Session     Activity Tolerance Patient tolerated treatment well   Patient Left in chair;with family/visitor present;with call bell/phone within reach;with chair alarm set   Nurse Communication Other (comment);Mobility status        Time: 9379-0240 OT Time Calculation (min): 23 min  Charges: OT General Charges $OT Visit: 1 Procedure OT Treatments $Self Care/Home Management : 23-37 mins  Ector Laurel, Thereasa Parkin 11/18/2013, 11:29 AM

## 2013-11-18 NOTE — Progress Notes (Signed)
Patient's husband has been frustrated because patient has been urinating on average every 20-30 minutes.  Staff has been assisting and husband has been assisting with patient. At around 2200, patient's husband helped her to the bedside commode, while helping her back to bed, patient had an assisted fall to the floor on her left knee with husband assisting.  Un-witnessed by staff.  By the time RN came in the room, patient was standing by the bedside with husband.  Husband and patient denies hitting head, patient is alert, vitals stable, scrape noticed on left knee.  On call contacted - new order for U/A test.  Patient is still having frequent urination. Husband was informed to allow staff to help with all bathroom/BSC requests.

## 2013-11-18 NOTE — Progress Notes (Signed)
Patient has continued to try to get up unassisted.  Bed alarm has been alerting staff.  Patient continues with frequent urination.  U/A is negative.  Patient has been alert and verbal.  She is disoriented to place and time.  Husband remains at the bedside and is compliant with allowing staff to help with frequent bathroom requests.

## 2013-11-18 NOTE — Progress Notes (Signed)
Patient ID: Danielle [name], female   DOB: October 14, 1932, 80 y.o.   MRN: [id number]     Subjective:  Patient reports pain as mild.  Patient sitting up in bed eating breakfast.  Denies any CP or SOB.  Objective:   VITALS:   Filed Vitals:   11/17/13 1427 11/17/13 2052 11/17/13 2200 11/18/13 0548  BP: 160/72 147/69 140/66 139/64  Pulse: 81 88 90 88  Temp: 98.9 F (37.2 C) 97.9 F (36.6 C) 97.9 F (36.6 C) 100.1 F (37.8 C)  TempSrc:  Oral Oral Oral  Resp: 17     Height:      Weight:      SpO2: 94% 94% 98% 95%    ABD soft Sensation intact distally Dorsiflexion/Plantar flexion intact Incision: dressing C/D/I and no drainage Good elbow, wrist,and hand motion  Lab Results  Component Value Date   WBC 10.7* 11/17/2013   HGB 14.0 11/17/2013   HCT 42.4 11/17/2013   MCV 91.4 11/17/2013   PLT 198 11/17/2013   BMET    Component Value Date/Time   NA 134* 11/17/2013 0500   K 4.5 11/17/2013 0500   CL 99 11/17/2013 0500   CO2 20 11/17/2013 0500   GLUCOSE 99 11/17/2013 0500   BUN 10 11/17/2013 0500   CREATININE 0.73 11/17/2013 0500   CALCIUM 9.0 11/17/2013 0500   GFRNONAA 79* 11/17/2013 0500   GFRAA >90 11/17/2013 0500     Assessment/Plan: 2 Days Post-Op   Principal Problem:   Primary osteoarthritis of right shoulder Active Problems:   Primary localized osteoarthrosis, shoulder region   Advance diet Up with therapy Discharge home with home health Sling at aqll times nwb right upper ext Follow up with Marchia Bond MD in 2 weeks   Danielle [name] 11/18/2013, 9:40 AM   Marchia Bond, MD Cell [phone]

## 2013-11-18 NOTE — Progress Notes (Signed)
Pt confused; agitated; and belligerent; Pt refuses to settle down; nurse, nurse tech and director at bedside; Joya Gaskins PA notified; Dr. Edmonia Lynch notified; hospitalist to assess and determine further course of action for pt. Sitter requested at bedside; Will continue to monitor pt

## 2013-11-18 NOTE — Consult Note (Signed)
Medical Consultation   Danielle [name]  GBT:[id number]  DOB: 10-31-1932  DOA: 11/16/2013  PCP: Tivis Ringer, MD  Requesting physician: Dr. Percell Miller  Reason for consultation: Acute delirium  History of Present Illness: Patient is a 80 year old female with history of osteoarthritis of the right shoulder, hypothyroidism, anxiety was admitted on 9/8 under orthopedic service for primary was 2 arthritis of the right shoulder. Patient underwent right total shoulder arthroplasty. Patient was being discharged today and was noticed to be very confused, agitated and combative. Medicine service was consulted for management of acute delirium. Patient was also noticed to be somewhat unsteady on ambulation. On my encounter, patient is still confused, states she is on highway, does not remember her birthday, agitated at her husband. She was ambulating prior to my encounter in the hallway, somewhat unsteady. Patient was noted to be on Xanax, Robaxin, morphine, oxycodone during this admission. UA is negative. No respiratory symptoms.  Allergies:  No Known Allergies    Past Medical History  Diagnosis Date  . Dysrhythmia   . Heart murmur   . Peripheral vascular disease     carotid   . Asthma   . Seasonal allergies   . Hypothyroidism   . Headache(784.0)   . Arthritis     Right shoulder arthritis  . Osteoarthritis of left shoulder 07/20/2013  . Anxiety   . Primary osteoarthritis of right shoulder 11/16/2013    Past Surgical History  Procedure Laterality Date  . Carotid endarterectomy Right   . Gastric restriction surgery Left 2006  . Tonsillectomy    . Colon surgery    . Eye surgery Bilateral     cataracts   . Colonoscopy    . Total shoulder arthroplasty Left 07/20/2013    Procedure: TOTAL SHOULDER ARTHROPLASTY;  Surgeon: [name], MD;  Location: [place];  Service: Orthopedics;  Laterality: Left;  . Cataract extraction w/ intraocular lens  implant, bilateral    . Total shoulder  arthroplasty Right 11/16/2013    Dr [name]  . Colon surgery  2005    had a colostomy for 6 months   . Total shoulder arthroplasty Right 11/16/2013    Procedure: RIGHT TOTAL SHOULDER ARTHROPLASTY;  Surgeon: [name], MD;  Location: [place];  Service: Orthopedics;  Laterality: Right;    Social History:  reports that she quit smoking about 50 years ago. Her smoking use included Cigarettes. She has a .5 pack-year smoking history. She has never used smokeless tobacco. She reports that she does not drink alcohol or use illicit drugs.  Family History  Problem Relation Age of Onset  . Cancer - Lung Mother   . Cancer - Other Other     Review of Systems:   Constitutional: Denies fever, chills, diaphoresis, appetite change and fatigue.  HEENT: Denies photophobia, eye pain, redness, hearing loss, ear pain, congestion, sore throat, rhinorrhea, sneezing, mouth sores, trouble swallowing, neck pain, neck stiffness and tinnitus.   Respiratory: Denies SOB, DOE, cough, chest tightness,  and wheezing.   Cardiovascular: Denies chest pain, palpitations and leg swelling.  Gastrointestinal: Denies nausea, vomiting, abdominal pain, diarrhea, constipation, blood in stool and abdominal distention.  Genitourinary: Denies dysuria, urgency, frequency, hematuria, flank pain and difficulty urinating.  Musculoskeletal: Denies myalgias, back pain, joint swelling, arthralgias and gait problem.  Skin: Denies pallor, rash and wound.  Neurological: Please see history of present illness Hematological: Denies adenopathy. Easy bruising, personal or family bleeding history  Psychiatric/Behavioral: Denies suicidal ideation, mood changes, confusion, nervousness, sleep disturbance  and agitation   Physical Exam: Blood pressure 143/68, pulse 87, temperature 98 F (36.7 C), temperature source Oral, resp. rate 17, height 5' 7.5" (1.715 m), weight 77.565 kg (171 lb), SpO2 95.00%.  General: Alert and awake, oriented to self  only,  not in any acute distress. HEENT: normocephalic, atraumatic, anicteric sclera, pupils reactive to light and accommodation, EOMI, oropharynx clear CVS: S1-S2 clear, no murmur rubs or gallops Chest: clear to auscultation bilaterally, no wheezing, rales or rhonchi Abdomen: soft nontender, nondistended, normal bowel sounds, no organomegaly Extremities: no cyanosis, clubbing or edema noted bilaterally Neuro: Cranial nerves II-XII intact, no focal neurological deficits, somewhat unsteady on ambulation Psych: alert and oriented, stable mood and affect Skin: no rashes or lesions  Labs on Admission:  Basic Metabolic Panel:  Recent Labs Lab 11/17/13 0500  NA 134*  K 4.5  CL 99  CO2 20  GLUCOSE 99  BUN 10  CREATININE 0.73  CALCIUM 9.0   Liver Function Tests: No results found for this basename: AST, ALT, ALKPHOS, BILITOT, PROT, ALBUMIN,  in the last 168 hours No results found for this basename: LIPASE, AMYLASE,  in the last 168 hours No results found for this basename: AMMONIA,  in the last 168 hours CBC:  Recent Labs Lab 11/17/13 0500  WBC 10.7*  HGB 14.0  HCT 42.4  MCV 91.4  PLT 198   Cardiac Enzymes: No results found for this basename: CKTOTAL, CKMB, CKMBINDEX, TROPONINI,  in the last 168 hours BNP: No components found with this basename: POCBNP,  CBG: No results found for this basename: GLUCAP,  in the last 168 hours  Inpatient Medications:   Scheduled Meds: . aspirin EC  81 mg Oral Daily  . calcium carbonate  1 tablet Oral Q breakfast  . cholecalciferol  1,000 Units Oral Daily  . docusate sodium  100 mg Oral BID  . levothyroxine  125 mcg Oral QAC breakfast  . loratadine  10 mg Oral Daily  . multivitamin with minerals  1 tablet Oral Daily  . nebivolol  10 mg Oral Daily  . scopolamine  1 patch Transdermal Q72H  . senna  1 tablet Oral BID   Continuous Infusions: . 0.45 % NaCl with KCl 20 mEq / L 75 mL/hr (11/16/13 1714)     Radiological Exams on  Admission: No results found.  Impression/Recommendations Principal Problem:   Primary osteoarthritis of right shoulder - Per primary service  Active Problems:   Acute encephalopathy: Possibly due to metabolic/medication induced encephalopathy due to narcotics, benzodiazepines. Patient's family also reported that she does have 'memory issues" possibly underlying dementia and may have worsened due to hospitalization, surgery and postop. UA 9/9 negative for UTI. - I have discontinued Xanax, Robaxin, oxycodone, morphine and placed her on scheduled Tylenol for now - Also given her acute encephalopathy with unsteadiness, ordered MRI of the brain to rule out any CVA - Will check stat CMET, CBC to rule out any metabolic abnormalities    Hypothyroidism - Continue Synthroid    HTN (hypertension) - BP is currently stable   [facility] medicine service will follow the patient, thank you for this consultation.   Time Spent on Admission: 50 mins  RAI,RIPUDEEP M.D. [facility] 11/18/2013, 4:27 PM  **Disclaimer: This note was dictated with voice recognition software. Similar sounding words can inadvertently be transcribed and this note may contain transcription errors which may not have been corrected upon publication of note.**

## 2013-11-18 NOTE — Progress Notes (Signed)
Physical Therapy Treatment Patient Details Name: Danielle [name] MRN: [id number] DOB: [birth year] Today's Date: 11/18/2013    History of Present Illness      PT Comments    Confusion appears to be clearing but is still present.  Pt is A & O x 2 and demo impulsivity and decreased safety awareness. Recommend pt continue as inpt until confusion resolved to decrease risk of falls upon d/c.  Follow Up Recommendations  No PT follow up;Supervision/Assistance - 24 hour     Equipment Recommendations  None recommended by PT    Recommendations for Other Services       Precautions / Restrictions Precautions Precautions: Fall;Shoulder Type of Shoulder Precautions: NWB Shoulder Interventions: Shoulder sling/immobilizer;Off for dressing/bathing/exercises Precaution Comments: reviewed precautions Required Braces or Orthoses: Sling Restrictions RUE Weight Bearing: Non weight bearing    Mobility  Bed Mobility         Supine to sit: Min guard     General bed mobility comments: verbal cues to maintain NWB RUE  Transfers   Equipment used: 1 person hand held assist   Sit to Stand: Min guard Stand pivot transfers: Min guard       General transfer comment: verbal/tactile cues for safety  Ambulation/Gait Ambulation/Gait assistance: Min assist Ambulation Distance (Feet): 200 Feet Assistive device: 1 person hand held assist Gait Pattern/deviations: WFL(Within Functional Limits);Drifts right/left Gait velocity: decreased   General Gait Details: unsteady   Stairs Stairs: Yes Stairs assistance: Min assist Stair Management: No rails;Forwards;Step to pattern;Alternating pattern Number of Stairs: 5 General stair comments: verbal cues for step to pattern to increase safety/stability  Wheelchair Mobility    Modified Rankin (Stroke Patients Only)       Balance                                    Cognition Arousal/Alertness: Awake/alert Behavior During  Therapy: Impulsive Overall Cognitive Status: Impaired/Different from baseline Area of Impairment: Safety/judgement;Problem solving;Memory;Orientation Orientation Level: Disoriented to;Place;Situation   Memory: Decreased recall of precautions;Decreased short-term memory   Safety/Judgement: Decreased awareness of safety   Problem Solving: Difficulty sequencing;Requires verbal cues;Requires tactile cues      Exercises      General Comments        Pertinent Vitals/Pain Pain Assessment: Faces Faces Pain Scale: Hurts a little bit Pain Location: RUE Pain Intervention(s): Monitored during session    Home Living                      Prior Function            PT Goals (current goals can now be found in the care plan section) Progress towards PT goals: Progressing toward goals    Frequency  Min 5X/week    PT Plan Discharge plan needs to be updated    Co-evaluation             End of Session Equipment Utilized During Treatment: Gait belt Activity Tolerance: Patient tolerated treatment well Patient left: in chair;with call bell/phone within reach;with chair alarm set     Time: 4081-4481 PT Time Calculation (min): 25 min  Charges:  $Gait Training: 23-37 mins                    G Codes:      Lorriane Shire 11/18/2013, 9:48 AM

## 2013-11-19 DIAGNOSIS — E785 Hyperlipidemia, unspecified: Secondary | ICD-10-CM

## 2013-11-19 NOTE — Progress Notes (Signed)
Safety sitter no longer at bedside d/t staffing needs. Pt currently comfortable in bed, showing no signs of agitation. Pt still with moments of confusion. Bed alarm on, door open, staff aware. Nursing will continue to monitor.

## 2013-11-19 NOTE — Progress Notes (Signed)
Pt still with impulsive behavior and periodically showing signs of agitation and confusion throughout shift. Consistently alert and oriented to self, occassionally to situation (sometimes refers to recent shoulder surgery), but otherwise disoriented X3. Pt with poor safety awareness as she continues to get up to bathroom unassisted and shows aggravation to staff at our attempts to help. Bed alarm remains on, door open, and this RN sitting outside room. Will continue to monitor.

## 2013-11-19 NOTE — Progress Notes (Signed)
Spoke with patient's daughter, [name], this morning; updated daughter on overnight events: pt able to rest, with occasional episodes of confusion and agitation, agitation increasing around 0400. Daughter aware that safety sitter unavailable after 2300, but that all attempts were being made at securing a safety sitter for day shift.

## 2013-11-19 NOTE — Progress Notes (Signed)
I participated in the care of this patient and agree with the above history, physical and evaluation. I performed a review of the history and a physical exam as detailed   Emersen Carroll Daniel Nejla Reasor MD  

## 2013-11-19 NOTE — Progress Notes (Signed)
Physical Therapy Treatment Patient Details Name: Danielle [name] MRN: [id number] DOB: 05-22-1932 Today's Date: 11/19/2013    History of Present Illness s/p R TSA    PT Comments    Entered room as chair alarm going off and patient walking into bathroom. Proceeded to work with patient and ambulate and review stairs. Patient educated on not getting up without calling however this makes her very upset. MD [facility] sitter earlier this AM. Patient is planning to DC home today with family. Will continue current POC  Follow Up Recommendations  No PT follow up;Supervision/Assistance - 24 hour     Equipment Recommendations  None recommended by PT    Recommendations for Other Services       Precautions / Restrictions Precautions Precautions: Fall;Shoulder Type of Shoulder Precautions: NWB Shoulder Interventions: Shoulder sling/immobilizer;Off for dressing/bathing/exercises Required Braces or Orthoses: Sling Restrictions RUE Weight Bearing: Non weight bearing    Mobility  Bed Mobility                  Transfers       Sit to Stand: Supervision Stand pivot transfers: Supervision          Ambulation/Gait Ambulation/Gait assistance: Min assist Ambulation Distance (Feet): 600 Feet Assistive device: 1 person hand held assist       General Gait Details: Patient appearing to be ambulating a bit better today. Small bouts of imbalance with turns but patient able to self correct   Stairs   Stairs assistance: Min guard Stair Management: One rail Right;One rail Left;Alternating pattern;Forwards;Step to pattern Number of Stairs: 10 General stair comments: verbal cues for step to pattern to increase safety/stability  Wheelchair Mobility    Modified Rankin (Stroke Patients Only)       Balance                                    Cognition Arousal/Alertness: Awake/alert Behavior During Therapy: Impulsive Overall Cognitive Status: Impaired/Different from  baseline Area of Impairment: Safety/judgement;Problem solving;Memory;Orientation Orientation Level: Disoriented to;Place;Situation   Memory: Decreased recall of precautions;Decreased short-term memory   Safety/Judgement: Decreased awareness of safety          Exercises      General Comments        Pertinent Vitals/Pain Pain Score: 10-Worst pain ever Pain Location: R UE Pain Descriptors / Indicators: Aching;Constant Pain Intervention(s): Monitored during session;Patient requesting pain meds-RN notified    Home Living                      Prior Function            PT Goals (current goals can now be found in the care plan section) Progress towards PT goals: Progressing toward goals    Frequency  Min 5X/week    PT Plan Current plan remains appropriate    Co-evaluation             End of Session Equipment Utilized During Treatment: Gait belt Activity Tolerance: Patient tolerated treatment well Patient left: in chair;with call bell/phone within reach;with chair alarm set     Time: 0902-0913 PT Time Calculation (min): 11 min  Charges:  $Gait Training: 8-22 mins                    G Codes:      Jacqualyn Posey 11/19/2013, 9:20 AM 11/19/2013 Jacqualyn Posey PTA [phone] pager  [phone] office     

## 2013-11-19 NOTE — Progress Notes (Signed)
Patient ID: Danielle [name]  female  QMG:[id number]    DOB: [birth year]    DOA: 11/16/2013  PCP: Tivis Ringer, MD  Assessment/Plan:  Principal Problem:  Primary osteoarthritis of right shoulder  - Per primary service - Okay to DC home from medical standpoint    Active Problems:  Acute encephalopathy:improved, back to baseline, confirmed with daughter at bedside.  Possibly due to metabolic/medication induced encephalopathy due to narcotics, benzodiazepines. Patient's family also reported that she does have 'memory issues" possibly underlying dementia and may have worsened due to hospitalization, surgery and postop, sundowning. UA 9/9 negative for UTI.  - Keep her off Xanax, Robaxin, oxycodone, morphine. She is tolerating scheduled Tylenol, may take occasional percocet for severe pain.   - MRI of the brain negative for acute CVA  - dc sitter   Hypothyroidism  - Continue Synthroid   HTN (hypertension)  - BP is currently stable   DVT Prophylaxis:  Code Status:  Family Communication: d/w daughter at bed side. She is back to baseline, okay to DC home, I will sign off. Please call me if any questions.        Subjective: No complaints, feeling better, daughter at bedside, alert and oriented, wants to go home  Objective: Weight change:  No intake or output data in the 24 hours ending 11/19/13 0820 Blood pressure 124/64, pulse 72, temperature 98.2 F (36.8 C), temperature source Oral, resp. rate 18, height 5' 7.5" (1.715 m), weight 77.565 kg (171 lb), SpO2 97.00%.  Physical Exam: General: Alert and awake, oriented x3, not in any acute distress. HEENT: anicteric sclera, PERLA, EOMI CVS: S1-S2 clear, no murmur rubs or gallops Chest: clear to auscultation bilaterally, no wheezing, rales or rhonchi Abdomen: soft nontender, nondistended, normal bowel sounds  Extremities: no cyanosis, clubbing or edema noted bilaterally, right arm in sling Neuro: Cranial nerves II-XII intact, no  focal neurological deficits  Lab Results: Basic Metabolic Panel:  Recent Labs Lab 11/17/13 0500 11/18/13 2000  NA 134* 136*  K 4.5 3.9  CL 99 100  CO2 20 24  GLUCOSE 99 110*  BUN 10 12  CREATININE 0.73 0.81  CALCIUM 9.0 8.9   Liver Function Tests:  Recent Labs Lab 11/18/13 2000  AST 29  ALT 15  ALKPHOS 81  BILITOT 1.6*  PROT 6.8  ALBUMIN 3.3*   No results found for this basename: LIPASE, AMYLASE,  in the last 168 hours No results found for this basename: AMMONIA,  in the last 168 hours CBC:  Recent Labs Lab 11/17/13 0500 11/18/13 2000  WBC 10.7* 8.8  HGB 14.0 13.1  HCT 42.4 38.7  MCV 91.4 88.0  PLT 198 172   Cardiac Enzymes: No results found for this basename: CKTOTAL, CKMB, CKMBINDEX, TROPONINI,  in the last 168 hours BNP: No components found with this basename: POCBNP,  CBG: No results found for this basename: GLUCAP,  in the last 168 hours   Micro Results: No results found for this or any previous visit (from the past 240 hour(s)).  Studies/Results: Mr Brain Wo Contrast  11/18/2013   CLINICAL DATA:  Acute encephalopathy. Unsteady. Recent right shoulder arthroplasty. Rule out stroke.  EXAM: MRI HEAD WITHOUT CONTRAST  TECHNIQUE: Multiplanar, multiecho pulse sequences of the brain and surrounding structures were obtained without intravenous contrast.  COMPARISON:  None.  FINDINGS: Images are mildly degraded by motion artifact.  There is no evidence of acute infarct, intracranial hemorrhage, mass, midline shift, or extra-axial fluid collection. There is mild-to-moderate generalized  cerebral atrophy. Small foci of T2 hyperintensity in the subcortical and deep cerebral white matter bilaterally are nonspecific but compatible with mild chronic small vessel ischemic disease.  Prior bilateral cataract extraction is noted. There is a small right maxillary sinus mucous retention cyst. Trace right mastoid fluid is present. Major intracranial vascular flow voids are  preserved.  IMPRESSION: 1. No acute intracranial abnormality. 2. Mild chronic small vessel ischemic disease.   Electronically Signed   By: Logan Bores   On: 11/18/2013 19:20   Dg Shoulder Right Port  11/16/2013   CLINICAL DATA:  Postop shoulder replacement.  EXAM: PORTABLE RIGHT SHOULDER - 2+ VIEW  COMPARISON:  CT 09/06/2013  FINDINGS: The patient has undergone right shoulder arthroplasty. Acetabular screw has been placed. No evidence for dislocation or new fracture on this single view. Right lung apex is clear.  IMPRESSION: Status post shoulder arthroplasty.  No adverse features identified.   Electronically Signed   By: Shon Hale M.D.   On: 11/16/2013 12:00    Medications: Scheduled Meds: . acetaminophen  650 mg Oral TID  . aspirin EC  81 mg Oral Daily  . calcium carbonate  1 tablet Oral Q breakfast  . cholecalciferol  1,000 Units Oral Daily  . docusate sodium  100 mg Oral BID  . levothyroxine  125 mcg Oral QAC breakfast  . loratadine  10 mg Oral Daily  . multivitamin with minerals  1 tablet Oral Daily  . nebivolol  10 mg Oral Daily  . scopolamine  1 patch Transdermal Q72H  . senna  1 tablet Oral BID      LOS: 3 days   Rosamund Nyland M.D. [facility] 11/19/2013, 8:20 AM Pager: [phone]  If 7PM-7AM, please contact night-coverage [email or web address] Password [email or web address]  **Disclaimer: This note was dictated with voice recognition software. Similar sounding words can inadvertently be transcribed and this note may contain transcription errors which may not have been corrected upon publication of note.**

## 2013-11-19 NOTE — Progress Notes (Signed)
Patient ID: Danielle [name], female   DOB: 05-22-1932, 80 y.o.   MRN: [id number]     Subjective:  Patient reports pain as mild.  Patient has had bouts of confusion but is very alert and aware of time and place and aware that she has had the bout of confusion and that is why she was kept overnight.    Objective:   VITALS:   Filed Vitals:   11/18/13 1522 11/18/13 2025 11/18/13 2229 11/19/13 0539  BP: 143/68 116/69 132/72 124/64  Pulse: 87 87 85 72  Temp: 98 F (36.7 C) 99.2 F (37.3 C) 99 F (37.2 C) 98.2 F (36.8 C)  TempSrc: Oral Oral Oral Oral  Resp:  18 18 18   Height:      Weight:      SpO2: 95% 98% 97% 97%    ABD soft Sensation intact distally Dorsiflexion/Plantar flexion intact Incision: dressing C/D/I and no drainage   Lab Results  Component Value Date   WBC 8.8 11/18/2013   HGB 13.1 11/18/2013   HCT 38.7 11/18/2013   MCV 88.0 11/18/2013   PLT 172 11/18/2013     Assessment/Plan: 3 Days Post-Op   Principal Problem:   Primary osteoarthritis of right shoulder Active Problems:   Hyperlipidemia   Hypothyroidism   HTN (hypertension)   Primary localized osteoarthrosis, shoulder region   Acute encephalopathy   Advance diet Up with therapy Altered mental status  Seen by [facility] and cleared  Continue to monitor until the afternoon If mental status stays stable will plan for DC home later today   [name] 11/19/2013, 9:37 AM   Edmonia Lynch MD [phone]

## 2013-11-30 DIAGNOSIS — Z4789 Encounter for other orthopedic aftercare: Secondary | ICD-10-CM | POA: Diagnosis not present

## 2013-12-08 ENCOUNTER — Encounter: Payer: Self-pay | Admitting: Cardiovascular Disease

## 2013-12-08 ENCOUNTER — Ambulatory Visit (INDEPENDENT_AMBULATORY_CARE_PROVIDER_SITE_OTHER): Payer: Medicare Other | Admitting: Cardiovascular Disease

## 2013-12-08 VITALS — BP 110/76 | HR 62 | Ht 67.5 in | Wt 171.5 lb

## 2013-12-08 DIAGNOSIS — I10 Essential (primary) hypertension: Secondary | ICD-10-CM

## 2013-12-08 DIAGNOSIS — I6529 Occlusion and stenosis of unspecified carotid artery: Secondary | ICD-10-CM | POA: Diagnosis not present

## 2013-12-08 DIAGNOSIS — I359 Nonrheumatic aortic valve disorder, unspecified: Secondary | ICD-10-CM | POA: Diagnosis not present

## 2013-12-08 DIAGNOSIS — E785 Hyperlipidemia, unspecified: Secondary | ICD-10-CM | POA: Diagnosis not present

## 2013-12-08 DIAGNOSIS — E039 Hypothyroidism, unspecified: Secondary | ICD-10-CM

## 2013-12-08 DIAGNOSIS — I358 Other nonrheumatic aortic valve disorders: Secondary | ICD-10-CM

## 2013-12-08 DIAGNOSIS — R002 Palpitations: Secondary | ICD-10-CM

## 2013-12-08 DIAGNOSIS — I6521 Occlusion and stenosis of right carotid artery: Secondary | ICD-10-CM

## 2013-12-08 NOTE — Progress Notes (Signed)
Patient ID: Danielle [name], female   DOB: 1932/12/15, 80 y.o.   MRN: [id number]      HPI: Danielle [name] is a 80 y.o. female who presents to the office today for a six month cardiology evaluation.  Since I last saw her she has undergone sequential shoulder replacement surgeries  [name] has a history of peripheral vascular disease and is status post right carotid endarterectomy in 2005. She also has a history of hypothyroidism, hypertension, palpitations, hyperlipidemia and has intermittent seasonal allergies. In January 2011 Echo Doppler study showed normal systolic function with grade 1 diastolic dysfunction, mild MR and TR and mild aortic sclerosis. A nuclear perfusion study at that time revealed normal perfusion.  Her last carotid duplex study was in January 2012 was was mildly abnormal with irregular mixed density plaque but was without significant additional stenosis.  She sees Dr Dagmar Hait, for primary care. Laboratory last year in September 2013 showed a cholesterol of 136 triglyceride 64 HDL 50 LDL 73. Normal renal function. She was not anemic.  Danielle [name] underwent  bilateral cataract surgery last summer by Dr. Prudencio Burly at [facility]. She tolerated surgery well from a cardiovascular standpoint. She denies recent leg swelling although she does have a prescription for Lasix. She is tolerating her Bystolic and denies any recent palpitations.   Her last echo Doppler study was in October 2014 and this revealed normal systolic function with an ejection fraction of 60-65%.  She did not have regional wall motion abnormalities.  There is grade 1 diastolic dysfunction.  There was evidence for aortic valve sclerosis without stenosis, mild to moderate mitral regurgitation, and mild tricuspid regurgitation.  She had mild pulmonary hypertension with an estimated RV systolic pressure at 33 mm.  Prior to planned left shoulder replacement in May 2015, she underwent a preoperative nuclear  perfusion study, which was done in April 2015.  This was low risk and showed some basal septal, artifact without scar or ischemia.  Danielle [name] underwent left shoulder surgery and on 07/20/2013 and on 11/16/2013 underwent right shoulder replacement surgery by Dr. [name].  She did have some confusion, which may be medication related.  Following her second surgery.  She tolerated both of these were cardiovascular standpoint.  She presently denies chest pain.  She denies palpitations.  She denies PND or orthopnea.    Past Medical History  Diagnosis Date  . Dysrhythmia   . Heart murmur   . Peripheral vascular disease     carotid   . Asthma   . Seasonal allergies   . Hypothyroidism   . Headache(784.0)   . Arthritis     Right shoulder arthritis  . Osteoarthritis of left shoulder 07/20/2013  . Anxiety   . Primary osteoarthritis of right shoulder 11/16/2013    Past surgical history is notable for right carotid endarterectomy and recent bilateral cataract surgeries.  No Known Allergies  Current Outpatient Prescriptions  Medication Sig Dispense Refill  . albuterol (PROVENTIL HFA;VENTOLIN HFA) 108 (90 BASE) MCG/ACT inhaler Inhale 1-2 puffs into the lungs every 6 (six) hours as needed for wheezing or shortness of breath.      Marland Kitchen alendronate (FOSAMAX) 70 MG tablet Take 70 mg by mouth once a week. On Sunday      . ALPRAZolam (XANAX) 0.5 MG tablet Take 0.5 mg by mouth at bedtime as needed for sleep.       Marland Kitchen aspirin EC 81 MG tablet Take 81 mg by mouth daily.      Marland Kitchen  calcium carbonate (OS-CAL) 600 MG TABS tablet Take 600 mg by mouth daily with breakfast.       . cetirizine (ZYRTEC) 10 MG tablet Take 10 mg by mouth daily.      . cholecalciferol (VITAMIN D) 1000 UNITS tablet Take 1,000 Units by mouth daily.       . fluticasone (FLONASE) 50 MCG/ACT nasal spray Place 1 spray into both nostrils daily as needed for allergies.       Marland Kitchen guaiFENesin (MUCINEX) 600 MG 12 hr tablet Take 1,200 mg by mouth 2 (two)  times daily as needed for to loosen phlegm.       Marland Kitchen levothyroxine (SYNTHROID, LEVOTHROID) 125 MCG tablet Take 125 mcg by mouth daily before breakfast.      . Multiple Vitamin (MULTIVITAMIN WITH MINERALS) TABS tablet Take 1 tablet by mouth daily.      . nebivolol (BYSTOLIC) 10 MG tablet Take 10 mg by mouth daily.      Marland Kitchen OVER THE COUNTER MEDICATION Take 1 capsule by mouth daily. Mega red-3      . polyethylene glycol (MIRALAX / GLYCOLAX) packet Take 17 g by mouth daily as needed for moderate constipation.       No current facility-administered medications for this visit.    History   Social History  . Marital Status: Married    Spouse Name: N/A    Number of Children: N/A  . Years of Education: N/A   Occupational History  . Not on file.   Social History Main Topics  . Smoking status: Former Smoker -- 0.25 packs/day for 2 years    Types: Cigarettes    Quit date: 07/13/1963  . Smokeless tobacco: Never Used     Comment: quit aprrox 50 years ago.  . Alcohol Use: No  . Drug Use: No  . Sexual Activity: Not on file   Other Topics Concern  . Not on file   Social History Narrative  . No narrative on file    Family history is notable that both parents are deceased mother at age 90 with cancer father at age 43. Her brother is deceased also and had various cancers. His sister is also deceased.   ROS General: Negative; No fevers, chills, or night sweats;  HEENT: Vision has significantly improved following cataract surgery No changes in vision or hearing, sinus congestion, difficulty swallowing Pulmonary: Negative; No cough, wheezing, shortness of breath, hemoptysis Cardiovascular: Negative; No chest pain, presyncope, syncope, palpitations GI: Negative; No nausea, vomiting, diarrhea, or abdominal pain GU: Negative; No dysuria, hematuria, or difficulty voiding Musculoskeletal: Positive for osteopenia versus osteoporosis.  She is status post bilateral sequential shoulder replacement  surgeries;  Hematologic/Oncology: Negative; no easy bruising, bleeding Endocrine: Negative; no heat/cold intolerance; no diabetes Neuro: Negative; no changes in balance, headaches Skin: Negative; No rashes or skin lesions Psychiatric: Negative; No behavioral problems, depression Sleep: Negative; No snoring, daytime sleepiness, hypersomnolence, bruxism, restless legs, hypnogognic hallucinations, no cataplexy Other comprehensive 14 point system review is negative.   PE BP 110/76  Pulse 62  Ht 5' 7.5" (1.715 m)  Wt 171 lb 8 oz (77.792 kg)  BMI 26.45 kg/m2  General: Alert, oriented, no distress.  Skin: normal turgor, no rashes HEENT: Normocephalic, atraumatic. Pupils round and reactive; sclera anicteric;no lid lag.  Nose without nasal septal hypertrophy Mouth/Parynx benign; Mallinpatti scale 2 Neck: No JVD, well-healed right carotid endarterectomy scar. Lungs: clear to ausculatation and percussion; no wheezing or rales Chest wall: Nontender to palpation Heart: RRR, s1 s2  normal 2/6 systolic murmur. No S3 gallop no rubs thrills or heave Abdomen: soft, nontender; no hepatosplenomehaly, BS+; abdominal aorta nontender and not dilated by palpation. Back: No CVA tenderness Pulses 2+ Extremities: no clubbing cyanosis or edema, Homan's sign negative  Neurologic: grossly nonfocal Psychological: Normal affect and mood  ECG (independently read by me): Sinus rhythm with first-degree AV block.  Prior March 2015 ECG (independently read by me): Normal sinus rhythm with PACs and a transient trigeminal rhythm.  ECG: Sinus rhythm 84 beats per minute period. We'll 168 ms. QTc interval 470 ms. No significant ST-T changes.  LABS:  BMET    Component Value Date/Time   NA 136* 11/18/2013 2000   K 3.9 11/18/2013 2000   CL 100 11/18/2013 2000   CO2 24 11/18/2013 2000   GLUCOSE 110* 11/18/2013 2000   BUN 12 11/18/2013 2000   CREATININE 0.81 11/18/2013 2000   CALCIUM 8.9 11/18/2013 2000   GFRNONAA 67*  11/18/2013 2000   GFRAA 78* 11/18/2013 2000     Hepatic Function Panel     Component Value Date/Time   PROT 6.8 11/18/2013 2000   ALBUMIN 3.3* 11/18/2013 2000   AST 29 11/18/2013 2000   ALT 15 11/18/2013 2000   ALKPHOS 81 11/18/2013 2000   BILITOT 1.6* 11/18/2013 2000     CBC    Component Value Date/Time   WBC 8.8 11/18/2013 2000   RBC 4.40 11/18/2013 2000   HGB 13.1 11/18/2013 2000   HCT 38.7 11/18/2013 2000   PLT 172 11/18/2013 2000   MCV 88.0 11/18/2013 2000   MCH 29.8 11/18/2013 2000   MCHC 33.9 11/18/2013 2000   RDW 13.6 11/18/2013 2000   LYMPHSABS 1.5 12/11/2008 1221   MONOABS 0.8 12/11/2008 1221   EOSABS 0.1 12/11/2008 1221   BASOSABS 0.0 12/11/2008 1221     BNP No results found for this basename: probnp    Lipid Panel  No results found for this basename: chol,  trig,  hdl,  cholhdl,  vldl,  ldlcalc     RADIOLOGY: No results found.    ASSESSMENT AND PLAN: [name] is a 80 year old female has a history of  hypertension, hyperlipidemia, tachypalpitations, and peripheral vascular disease. She is 10 years status post right carotid carotid endarterectomy. She has a cardiac murmur suggestive of aortic sclerosis.  Her last carotid duplex scan was in January 2012. Her recent echo Doppler study shows normal systolic function. She did have aortic valve sclerosis without stenosis with trace aortic insufficiency. She also had mitral annular calcification with mild to moderate central regurgitation. There was grade 1 diastolic dysfunction.  She has been on Bystolic.  Her ECG today is stable.  She's not having ectopy.  She denies recent palpitations.  Her blood pressure is well controlled with Bystolic 10 mg.  She is on Synthroid 125 mcg for hypothyroidism and is tolerating this well without symptoms.  She is on Fosamax for her osteopenia/osteoporosis.  She's not having any significant paresthesias.  She will continue with her current medical regimen.  I will see her in one year for  cardiology reevaluation.   [name], MD, [facility]  12/08/2013 3:42 PM

## 2013-12-08 NOTE — Patient Instructions (Signed)
Your physician recommends that you schedule a follow-up appointment in: 1 Year  

## 2013-12-21 DIAGNOSIS — Z23 Encounter for immunization: Secondary | ICD-10-CM | POA: Diagnosis not present

## 2013-12-21 DIAGNOSIS — M25511 Pain in right shoulder: Secondary | ICD-10-CM | POA: Diagnosis not present

## 2013-12-21 DIAGNOSIS — I10 Essential (primary) hypertension: Secondary | ICD-10-CM | POA: Diagnosis not present

## 2013-12-21 DIAGNOSIS — E039 Hypothyroidism, unspecified: Secondary | ICD-10-CM | POA: Diagnosis not present

## 2013-12-21 DIAGNOSIS — Z6827 Body mass index (BMI) 27.0-27.9, adult: Secondary | ICD-10-CM | POA: Diagnosis not present

## 2013-12-21 DIAGNOSIS — G9340 Encephalopathy, unspecified: Secondary | ICD-10-CM | POA: Diagnosis not present

## 2014-01-04 DIAGNOSIS — M25511 Pain in right shoulder: Secondary | ICD-10-CM | POA: Diagnosis not present

## 2014-01-04 DIAGNOSIS — Z96611 Presence of right artificial shoulder joint: Secondary | ICD-10-CM | POA: Diagnosis not present

## 2014-01-04 DIAGNOSIS — M19011 Primary osteoarthritis, right shoulder: Secondary | ICD-10-CM | POA: Diagnosis not present

## 2014-01-04 DIAGNOSIS — R531 Weakness: Secondary | ICD-10-CM | POA: Diagnosis not present

## 2014-01-05 DIAGNOSIS — Z96611 Presence of right artificial shoulder joint: Secondary | ICD-10-CM | POA: Diagnosis not present

## 2014-01-05 DIAGNOSIS — M19011 Primary osteoarthritis, right shoulder: Secondary | ICD-10-CM | POA: Diagnosis not present

## 2014-01-05 DIAGNOSIS — R531 Weakness: Secondary | ICD-10-CM | POA: Diagnosis not present

## 2014-01-05 DIAGNOSIS — M25511 Pain in right shoulder: Secondary | ICD-10-CM | POA: Diagnosis not present

## 2014-01-11 DIAGNOSIS — Z96611 Presence of right artificial shoulder joint: Secondary | ICD-10-CM | POA: Diagnosis not present

## 2014-01-11 DIAGNOSIS — R531 Weakness: Secondary | ICD-10-CM | POA: Diagnosis not present

## 2014-01-11 DIAGNOSIS — M25511 Pain in right shoulder: Secondary | ICD-10-CM | POA: Diagnosis not present

## 2014-01-11 DIAGNOSIS — M19011 Primary osteoarthritis, right shoulder: Secondary | ICD-10-CM | POA: Diagnosis not present

## 2014-01-13 DIAGNOSIS — Z96611 Presence of right artificial shoulder joint: Secondary | ICD-10-CM | POA: Diagnosis not present

## 2014-01-13 DIAGNOSIS — M25511 Pain in right shoulder: Secondary | ICD-10-CM | POA: Diagnosis not present

## 2014-01-13 DIAGNOSIS — M19011 Primary osteoarthritis, right shoulder: Secondary | ICD-10-CM | POA: Diagnosis not present

## 2014-01-13 DIAGNOSIS — R531 Weakness: Secondary | ICD-10-CM | POA: Diagnosis not present

## 2014-01-19 DIAGNOSIS — Z96611 Presence of right artificial shoulder joint: Secondary | ICD-10-CM | POA: Diagnosis not present

## 2014-01-19 DIAGNOSIS — R531 Weakness: Secondary | ICD-10-CM | POA: Diagnosis not present

## 2014-01-19 DIAGNOSIS — M25511 Pain in right shoulder: Secondary | ICD-10-CM | POA: Diagnosis not present

## 2014-01-19 DIAGNOSIS — M19011 Primary osteoarthritis, right shoulder: Secondary | ICD-10-CM | POA: Diagnosis not present

## 2014-01-20 DIAGNOSIS — Z96611 Presence of right artificial shoulder joint: Secondary | ICD-10-CM | POA: Diagnosis not present

## 2014-01-20 DIAGNOSIS — M25511 Pain in right shoulder: Secondary | ICD-10-CM | POA: Diagnosis not present

## 2014-01-20 DIAGNOSIS — R531 Weakness: Secondary | ICD-10-CM | POA: Diagnosis not present

## 2014-01-20 DIAGNOSIS — M19011 Primary osteoarthritis, right shoulder: Secondary | ICD-10-CM | POA: Diagnosis not present

## 2014-01-24 ENCOUNTER — Other Ambulatory Visit: Payer: Self-pay

## 2014-01-24 DIAGNOSIS — Z1231 Encounter for screening mammogram for malignant neoplasm of breast: Secondary | ICD-10-CM

## 2014-01-25 DIAGNOSIS — R531 Weakness: Secondary | ICD-10-CM | POA: Diagnosis not present

## 2014-01-25 DIAGNOSIS — Z96611 Presence of right artificial shoulder joint: Secondary | ICD-10-CM | POA: Diagnosis not present

## 2014-01-25 DIAGNOSIS — M19011 Primary osteoarthritis, right shoulder: Secondary | ICD-10-CM | POA: Diagnosis not present

## 2014-01-25 DIAGNOSIS — M25511 Pain in right shoulder: Secondary | ICD-10-CM | POA: Diagnosis not present

## 2014-02-23 ENCOUNTER — Ambulatory Visit
Admission: RE | Admit: 2014-02-23 | Discharge: 2014-02-23 | Disposition: A | Discharge status: Home / Self Care | Payer: Medicare Other | Source: Ambulatory Visit

## 2014-02-23 DIAGNOSIS — Z1231 Encounter for screening mammogram for malignant neoplasm of breast: Secondary | ICD-10-CM | POA: Diagnosis not present

## 2014-04-06 DIAGNOSIS — H04123 Dry eye syndrome of bilateral lacrimal glands: Secondary | ICD-10-CM | POA: Diagnosis not present

## 2014-04-06 DIAGNOSIS — Z961 Presence of intraocular lens: Secondary | ICD-10-CM | POA: Diagnosis not present

## 2014-04-06 DIAGNOSIS — H26491 Other secondary cataract, right eye: Secondary | ICD-10-CM | POA: Diagnosis not present

## 2014-05-02 DIAGNOSIS — I10 Essential (primary) hypertension: Secondary | ICD-10-CM | POA: Diagnosis not present

## 2014-05-02 DIAGNOSIS — F418 Other specified anxiety disorders: Secondary | ICD-10-CM | POA: Diagnosis not present

## 2014-05-02 DIAGNOSIS — Z6827 Body mass index (BMI) 27.0-27.9, adult: Secondary | ICD-10-CM | POA: Diagnosis not present

## 2014-05-02 DIAGNOSIS — J45909 Unspecified asthma, uncomplicated: Secondary | ICD-10-CM | POA: Diagnosis not present

## 2014-05-02 DIAGNOSIS — Z1389 Encounter for screening for other disorder: Secondary | ICD-10-CM | POA: Diagnosis not present

## 2014-05-02 DIAGNOSIS — E785 Hyperlipidemia, unspecified: Secondary | ICD-10-CM | POA: Diagnosis not present

## 2014-05-02 DIAGNOSIS — E039 Hypothyroidism, unspecified: Secondary | ICD-10-CM | POA: Diagnosis not present

## 2014-07-04 DIAGNOSIS — F411 Generalized anxiety disorder: Secondary | ICD-10-CM | POA: Diagnosis not present

## 2014-07-04 DIAGNOSIS — J019 Acute sinusitis, unspecified: Secondary | ICD-10-CM | POA: Diagnosis not present

## 2014-07-04 DIAGNOSIS — N390 Urinary tract infection, site not specified: Secondary | ICD-10-CM | POA: Diagnosis not present

## 2014-10-27 DIAGNOSIS — I739 Peripheral vascular disease, unspecified: Secondary | ICD-10-CM | POA: Diagnosis not present

## 2014-10-27 DIAGNOSIS — I10 Essential (primary) hypertension: Secondary | ICD-10-CM | POA: Diagnosis not present

## 2014-10-27 DIAGNOSIS — E039 Hypothyroidism, unspecified: Secondary | ICD-10-CM | POA: Diagnosis not present

## 2014-10-27 DIAGNOSIS — E559 Vitamin D deficiency, unspecified: Secondary | ICD-10-CM | POA: Diagnosis not present

## 2014-10-27 DIAGNOSIS — E785 Hyperlipidemia, unspecified: Secondary | ICD-10-CM | POA: Diagnosis not present

## 2014-10-27 DIAGNOSIS — R8299 Other abnormal findings in urine: Secondary | ICD-10-CM | POA: Diagnosis not present

## 2014-10-27 DIAGNOSIS — N390 Urinary tract infection, site not specified: Secondary | ICD-10-CM | POA: Diagnosis not present

## 2014-11-07 DIAGNOSIS — K5790 Diverticulosis of intestine, part unspecified, without perforation or abscess without bleeding: Secondary | ICD-10-CM | POA: Diagnosis not present

## 2014-11-07 DIAGNOSIS — J45909 Unspecified asthma, uncomplicated: Secondary | ICD-10-CM | POA: Diagnosis not present

## 2014-11-07 DIAGNOSIS — I10 Essential (primary) hypertension: Secondary | ICD-10-CM | POA: Diagnosis not present

## 2014-11-07 DIAGNOSIS — E785 Hyperlipidemia, unspecified: Secondary | ICD-10-CM | POA: Diagnosis not present

## 2014-11-07 DIAGNOSIS — Z0000 Encounter for general adult medical examination without abnormal findings: Secondary | ICD-10-CM | POA: Diagnosis not present

## 2014-11-07 DIAGNOSIS — F418 Other specified anxiety disorders: Secondary | ICD-10-CM | POA: Diagnosis not present

## 2014-11-07 DIAGNOSIS — R002 Palpitations: Secondary | ICD-10-CM | POA: Diagnosis not present

## 2014-11-07 DIAGNOSIS — E039 Hypothyroidism, unspecified: Secondary | ICD-10-CM | POA: Diagnosis not present

## 2014-11-07 DIAGNOSIS — G9340 Encephalopathy, unspecified: Secondary | ICD-10-CM | POA: Diagnosis not present

## 2014-11-07 DIAGNOSIS — Z1212 Encounter for screening for malignant neoplasm of rectum: Secondary | ICD-10-CM | POA: Diagnosis not present

## 2014-11-07 DIAGNOSIS — Z6826 Body mass index (BMI) 26.0-26.9, adult: Secondary | ICD-10-CM | POA: Diagnosis not present

## 2014-11-07 DIAGNOSIS — Z23 Encounter for immunization: Secondary | ICD-10-CM | POA: Diagnosis not present

## 2014-11-07 DIAGNOSIS — M859 Disorder of bone density and structure, unspecified: Secondary | ICD-10-CM | POA: Diagnosis not present

## 2014-12-06 ENCOUNTER — Ambulatory Visit (INDEPENDENT_AMBULATORY_CARE_PROVIDER_SITE_OTHER): Payer: Medicare Other | Admitting: Cardiovascular Disease

## 2014-12-06 ENCOUNTER — Other Ambulatory Visit: Payer: Self-pay | Admitting: Cardiovascular Disease

## 2014-12-06 VITALS — BP 144/86 | HR 67 | Ht 65.0 in | Wt 168.3 lb

## 2014-12-06 DIAGNOSIS — R011 Cardiac murmur, unspecified: Secondary | ICD-10-CM | POA: Diagnosis not present

## 2014-12-06 DIAGNOSIS — I10 Essential (primary) hypertension: Secondary | ICD-10-CM | POA: Diagnosis not present

## 2014-12-06 DIAGNOSIS — E785 Hyperlipidemia, unspecified: Secondary | ICD-10-CM

## 2014-12-06 DIAGNOSIS — E039 Hypothyroidism, unspecified: Secondary | ICD-10-CM

## 2014-12-06 DIAGNOSIS — R002 Palpitations: Secondary | ICD-10-CM | POA: Diagnosis not present

## 2014-12-06 DIAGNOSIS — I6521 Occlusion and stenosis of right carotid artery: Secondary | ICD-10-CM

## 2014-12-06 DIAGNOSIS — I358 Other nonrheumatic aortic valve disorders: Secondary | ICD-10-CM

## 2014-12-06 NOTE — Telephone Encounter (Signed)
REFILL 

## 2014-12-06 NOTE — Patient Instructions (Signed)
Your physician has requested that you have an echocardiogram and office visit in 1 year or sooner if needed   Echocardiography is a painless test that uses sound waves to create images of your heart. It provides your doctor with information about the size and shape of your heart and how well your heart's chambers and valves are working. This procedure takes approximately one hour. There are no restrictions for this procedure.

## 2014-12-08 ENCOUNTER — Encounter: Payer: Self-pay | Admitting: Cardiovascular Disease

## 2014-12-08 NOTE — Progress Notes (Signed)
Patient ID: Danielle [name], female   DOB: 04-Jun-1932, 81 y.o.   MRN: [id number]     HPI: Danielle [name] is a 81 y.o. female who presents to the office today for a one year cardiology evaluation.   Danielle [name] has a history of peripheral vascular disease and is status post right carotid endarterectomy in 2005. She also has a history of hypothyroidism, hypertension, palpitations, hyperlipidemia and has intermittent seasonal allergies. In January 2011 Echo Doppler study showed normal systolic function with grade 1 diastolic dysfunction, mild MR and TR and mild aortic sclerosis. A nuclear perfusion study at that time revealed normal perfusion.  A carotid duplex study was in January 2012 was was mildly abnormal with irregular mixed density plaque but was without significant additional stenosis.  She sees Dr Dagmar Hait, for primary care. Laboratory in September 2013 showed a cholesterol of 136 triglyceride 64 HDL 50 LDL 73. Normal renal function. She was not anemic.  Danielle [name] underwent  bilateral cataract surgery last summer by Dr. Prudencio Burly at [facility]. She tolerated surgery well from a cardiovascular standpoint. She denies recent leg swelling although she does have a prescription for Lasix. She is tolerating her Bystolic and denies any recent palpitations.   Her last echo Doppler study was in October 2014 and this revealed normal systolic function with an ejection fraction of 60-65%.  She did not have regional wall motion abnormalities.  There is grade 1 diastolic dysfunction.  There was evidence for aortic valve sclerosis without stenosis, mild to moderate mitral regurgitation, and mild tricuspid regurgitation.  She had mild pulmonary hypertension with an estimated RV systolic pressure at 33 mm.  Prior to planned left shoulder replacement in May 2015, she underwent a preoperative nuclear perfusion study  in April 2015.  This was low risk and showed some basal septal, artifact without scar or  ischemia.  Danielle [name] underwent left shoulder surgery and on 07/20/2013 and on 11/16/2013 underwent right shoulder replacement surgery by Dr. [name].  She did have some confusion, which may be medication related.  Following her second surgery.  She tolerated both of these were cardiovascular standpoint.   as I last saw her following her surgeries over the past year, she has continued to do well.  She specifically denies any chest pain.  There is no PND or orthopnea. She has been on levothyroxine 125 g for hypothyroidism. She denies any palpitations. She states her blood pressure has been stable. She presents for evaluation.  Past Medical History  Diagnosis Date  . Dysrhythmia   . Heart murmur   . Peripheral vascular disease     carotid   . Asthma   . Seasonal allergies   . Hypothyroidism   . Headache(784.0)   . Arthritis     Right shoulder arthritis  . Osteoarthritis of left shoulder 07/20/2013  . Anxiety   . Primary osteoarthritis of right shoulder 11/16/2013  . Carotid artery disease   . Diverticulitis     Past surgical history is notable for right carotid endarterectomy and recent bilateral cataract surgeries.  No Known Allergies  Current Outpatient Prescriptions  Medication Sig Dispense Refill  . alendronate (FOSAMAX) 70 MG tablet Take 70 mg by mouth once a week. On Sunday    . ALPRAZolam (XANAX) 0.5 MG tablet Take 0.5 mg by mouth at bedtime as needed for sleep.     Marland Kitchen aspirin EC 81 MG tablet Take 81 mg by mouth daily.    . calcium carbonate (OS-CAL) 600  MG TABS tablet Take 600 mg by mouth daily with breakfast.     . cetirizine (ZYRTEC) 10 MG tablet Take 10 mg by mouth daily.    . cholecalciferol (VITAMIN D) 1000 UNITS tablet Take 1,000 Units by mouth daily.     Marland Kitchen donepezil (ARICEPT) 5 MG tablet Take 5 mg by mouth daily.  2  . fluticasone (FLONASE) 50 MCG/ACT nasal spray Place 1 spray into both nostrils daily as needed for allergies.     Marland Kitchen guaiFENesin (MUCINEX) 600 MG 12 hr  tablet Take 1,200 mg by mouth 2 (two) times daily as needed for to loosen phlegm.     Marland Kitchen levothyroxine (SYNTHROID, LEVOTHROID) 137 MCG tablet Take 137 mcg by mouth daily before breakfast.    . Multiple Vitamin (MULTIVITAMIN WITH MINERALS) TABS tablet Take 1 tablet by mouth daily.    Marland Kitchen OVER THE COUNTER MEDICATION Take 1 capsule by mouth daily. Mega red-3    . polyethylene glycol (MIRALAX / GLYCOLAX) packet Take 17 g by mouth daily as needed for moderate constipation.    Marland Kitchen BYSTOLIC 10 MG tablet TAKE 1 TABLET BY MOUTH EVERY DAY. 90 tablet 0   No current facility-administered medications for this visit.    Social History   Social History  . Marital Status: Married    Spouse Name: N/A  . Number of Children: N/A  . Years of Education: N/A   Occupational History  . Not on file.   Social History Main Topics  . Smoking status: Former Smoker -- 0.25 packs/day for 2 years    Types: Cigarettes    Quit date: 07/13/1963  . Smokeless tobacco: Never Used     Comment: quit aprrox 50 years ago.  . Alcohol Use: No  . Drug Use: No  . Sexual Activity: Not on file   Other Topics Concern  . Not on file   Social History Narrative    Family history is notable that both parents are deceased mother at age 79 with cancer father at age 38. Her brother is deceased also and had various cancers. His sister is also deceased.   ROS General: Negative; No fevers, chills, or night sweats;  HEENT: Vision has significantly improved following cataract surgery No changes in vision or hearing, sinus congestion, difficulty swallowing Pulmonary: Negative; No cough, wheezing, shortness of breath, hemoptysis Cardiovascular: Negative; No chest pain, presyncope, syncope, palpitations GI: Negative; No nausea, vomiting, diarrhea, or abdominal pain GU: Negative; No dysuria, hematuria, or difficulty voiding Musculoskeletal: Positive for osteopenia versus osteoporosis.  She is status post bilateral sequential shoulder  replacement surgeries;  Hematologic/Oncology: Negative; no easy bruising, bleeding Endocrine: Negative; no heat/cold intolerance; no diabetes Neuro: Negative; no changes in balance, headaches Skin: Negative; No rashes or skin lesions Psychiatric: Negative; No behavioral problems, depression Sleep: Negative; No snoring, daytime sleepiness, hypersomnolence, bruxism, restless legs, hypnogognic hallucinations, no cataplexy Other comprehensive 14 point system review is negative.   PE BP 144/86 mmHg  Pulse 67  Ht 5' 5"  (1.651 m)  Wt 168 lb 4.8 oz (76.34 kg)  BMI 28.01 kg/m2  Repeat blood pressure by me 130/78. Wt Readings from Last 3 Encounters:  12/06/14 168 lb 4.8 oz (76.34 kg)  12/08/13 171 lb 8 oz (77.792 kg)  11/16/13 171 lb (77.565 kg)   General: Alert, oriented, no distress.  Skin: normal turgor, no rashes HEENT: Normocephalic, atraumatic. Pupils round and reactive; sclera anicteric;no lid lag.  Nose without nasal septal hypertrophy Mouth/Parynx benign; Mallinpatti scale 2 Neck: No JVD, well-healed  right carotid endarterectomy scar. Lungs: clear to ausculatation and percussion; no wheezing or rales Chest wall: Nontender to palpation Heart: RRR, s1 s2 normal 2/6 systolic murmur. No S3 gallop no rubs thrills or heave Abdomen: soft, nontender; no hepatosplenomehaly, BS+; abdominal aorta nontender and not dilated by palpation. Back: No CVA tenderness Pulses 2+ Extremities: no clubbing cyanosis or edema, Homan's sign negative  Neurologic: grossly nonfocal Psychological: Normal affect and mood  ECG (independently read by me):  Normal sinus rhythm at 67 bpm.  No ectopy.    Ffirst-degree AV block  PR interval at 206 ms.  ECG (independently read by me): Sinus rhythm with first-degree AV block.  Prior March 2015 ECG (independently read by me): Normal sinus rhythm with PACs and a transient trigeminal rhythm.  ECG: Sinus rhythm 84 beats per minute period. We'll 168 ms. QTc interval  470 ms. No significant ST-T changes.  LABS: BMP Latest Ref Rng 11/18/2013 11/17/2013 11/08/2013  Glucose 70 - 99 mg/dL 110(H) 99 101(H)  BUN 6 - 23 mg/dL 12 10 10   Creatinine 0.50 - 1.10 mg/dL 0.81 0.73 0.81  Sodium 137 - 147 mEq/L 136(L) 134(L) 141  Potassium 3.7 - 5.3 mEq/L 3.9 4.5 4.8  Chloride 96 - 112 mEq/L 100 99 105  CO2 19 - 32 mEq/L 24 20 22   Calcium 8.4 - 10.5 mg/dL 8.9 9.0 9.6   Hepatic Function Latest Ref Rng 11/18/2013 12/11/2008  Total Protein 6.0 - 8.3 g/dL 6.8 7.4  Albumin 3.5 - 5.2 g/dL 3.3(L) 4.1  AST 0 - 37 U/L 29 34  ALT 0 - 35 U/L 15 27  Alk Phosphatase 39 - 117 U/L 81 83  Total Bilirubin 0.3 - 1.2 mg/dL 1.6(H) 1.1   CBC Latest Ref Rng 11/18/2013 11/17/2013 11/08/2013  WBC 4.0 - 10.5 K/uL 8.8 10.7(H) 7.3  Hemoglobin 12.0 - 15.0 g/dL 13.1 14.0 14.5  Hematocrit 36.0 - 46.0 % 38.7 42.4 42.4  Platelets 150 - 400 K/uL 172 198 205   Lab Results  Component Value Date   MCV 88.0 11/18/2013   MCV 91.4 11/17/2013   MCV 88.3 11/08/2013   Lipid Panel  No results found for: CHOL, TRIG, HDL, CHOLHDL, VLDL, LDLCALC, LDLDIRECT   RADIOLOGY: No results found.    ASSESSMENT AND PLAN: Danielle [name] is a 81 year old female has a history of  hypertension, hyperlipidemia, tachypalpitations, and peripheral vascular disease. She is 11 years status post right carotid carotid endarterectomy. She has a cardiac murmur suggestive of aortic sclerosis.  Her last carotid duplex scan was in January 2012. Her last echo Doppler study was in 2014 and showed normal systolic function. She did have aortic valve sclerosis without stenosis with trace aortic insufficiency. She also had mitral annular calcification with mild to moderate central regurgitation. There was grade 1 diastolic dysfunction.  She has been on Bystolic.  Her ECG today is stable.  She isnot having ectopy.  She denies recent palpitations.  Her blood pressure is well controlled with Bystolic 10 mg.  She is on Synthroid 125 mcg for  hypothyroidism and is tolerating this well without symptoms.  She is on Fosamax for her osteopenia/osteoporosis.   Her EKG is stable and shows mild first-degree AV block. I have recommended she continue her current medical therapy. In one year, she will undergo a three-year follow-up echo Doppler study and I will see her in the office for follow-up evaluation.   Time spent: 25 minutes  [name], MD, [facility]  12/08/2014 6:59 PM

## 2015-01-16 ENCOUNTER — Other Ambulatory Visit: Payer: Self-pay

## 2015-01-16 DIAGNOSIS — Z1231 Encounter for screening mammogram for malignant neoplasm of breast: Secondary | ICD-10-CM

## 2015-01-17 DIAGNOSIS — R413 Other amnesia: Secondary | ICD-10-CM | POA: Diagnosis not present

## 2015-01-17 DIAGNOSIS — Z6826 Body mass index (BMI) 26.0-26.9, adult: Secondary | ICD-10-CM | POA: Diagnosis not present

## 2015-02-28 ENCOUNTER — Ambulatory Visit
Admission: RE | Admit: 2015-02-28 | Discharge: 2015-02-28 | Disposition: A | Discharge status: Home / Self Care | Payer: Medicare Other | Source: Ambulatory Visit

## 2015-02-28 DIAGNOSIS — Z1231 Encounter for screening mammogram for malignant neoplasm of breast: Secondary | ICD-10-CM | POA: Diagnosis not present

## 2015-03-04 ENCOUNTER — Other Ambulatory Visit: Payer: Self-pay | Admitting: Cardiovascular Disease

## 2015-03-15 DIAGNOSIS — R413 Other amnesia: Secondary | ICD-10-CM | POA: Diagnosis not present

## 2015-03-15 DIAGNOSIS — J309 Allergic rhinitis, unspecified: Secondary | ICD-10-CM | POA: Diagnosis not present

## 2015-03-15 DIAGNOSIS — Z6826 Body mass index (BMI) 26.0-26.9, adult: Secondary | ICD-10-CM | POA: Diagnosis not present

## 2015-03-15 DIAGNOSIS — E038 Other specified hypothyroidism: Secondary | ICD-10-CM | POA: Diagnosis not present

## 2015-03-15 DIAGNOSIS — I10 Essential (primary) hypertension: Secondary | ICD-10-CM | POA: Diagnosis not present

## 2015-04-13 DIAGNOSIS — H26493 Other secondary cataract, bilateral: Secondary | ICD-10-CM | POA: Diagnosis not present

## 2015-04-13 DIAGNOSIS — Z961 Presence of intraocular lens: Secondary | ICD-10-CM | POA: Diagnosis not present

## 2015-04-13 DIAGNOSIS — Z0100 Encounter for examination of eyes and vision without abnormal findings: Secondary | ICD-10-CM | POA: Diagnosis not present

## 2015-04-13 DIAGNOSIS — H04123 Dry eye syndrome of bilateral lacrimal glands: Secondary | ICD-10-CM | POA: Diagnosis not present

## 2015-04-25 DIAGNOSIS — L720 Epidermal cyst: Secondary | ICD-10-CM | POA: Diagnosis not present

## 2015-04-25 DIAGNOSIS — L570 Actinic keratosis: Secondary | ICD-10-CM | POA: Diagnosis not present

## 2015-04-25 DIAGNOSIS — D485 Neoplasm of uncertain behavior of skin: Secondary | ICD-10-CM | POA: Diagnosis not present

## 2015-07-12 DIAGNOSIS — M5416 Radiculopathy, lumbar region: Secondary | ICD-10-CM | POA: Diagnosis not present

## 2015-07-12 DIAGNOSIS — Z1389 Encounter for screening for other disorder: Secondary | ICD-10-CM | POA: Diagnosis not present

## 2015-07-12 DIAGNOSIS — E038 Other specified hypothyroidism: Secondary | ICD-10-CM | POA: Diagnosis not present

## 2015-07-12 DIAGNOSIS — M79673 Pain in unspecified foot: Secondary | ICD-10-CM | POA: Diagnosis not present

## 2015-07-12 DIAGNOSIS — Z6826 Body mass index (BMI) 26.0-26.9, adult: Secondary | ICD-10-CM | POA: Diagnosis not present

## 2015-07-12 DIAGNOSIS — R413 Other amnesia: Secondary | ICD-10-CM | POA: Diagnosis not present

## 2015-07-12 DIAGNOSIS — J309 Allergic rhinitis, unspecified: Secondary | ICD-10-CM | POA: Diagnosis not present

## 2015-07-12 DIAGNOSIS — I10 Essential (primary) hypertension: Secondary | ICD-10-CM | POA: Diagnosis not present

## 2015-07-13 DIAGNOSIS — H26493 Other secondary cataract, bilateral: Secondary | ICD-10-CM | POA: Diagnosis not present

## 2015-08-22 DIAGNOSIS — J309 Allergic rhinitis, unspecified: Secondary | ICD-10-CM | POA: Diagnosis not present

## 2015-08-22 DIAGNOSIS — J45909 Unspecified asthma, uncomplicated: Secondary | ICD-10-CM | POA: Diagnosis not present

## 2015-08-22 DIAGNOSIS — R062 Wheezing: Secondary | ICD-10-CM | POA: Diagnosis not present

## 2015-08-22 DIAGNOSIS — J209 Acute bronchitis, unspecified: Secondary | ICD-10-CM | POA: Diagnosis not present

## 2015-10-02 DIAGNOSIS — N3281 Overactive bladder: Secondary | ICD-10-CM | POA: Diagnosis not present

## 2015-10-02 DIAGNOSIS — R1310 Dysphagia, unspecified: Secondary | ICD-10-CM | POA: Diagnosis not present

## 2015-10-02 DIAGNOSIS — R05 Cough: Secondary | ICD-10-CM | POA: Diagnosis not present

## 2015-10-02 DIAGNOSIS — Z6826 Body mass index (BMI) 26.0-26.9, adult: Secondary | ICD-10-CM | POA: Diagnosis not present

## 2015-10-02 DIAGNOSIS — J309 Allergic rhinitis, unspecified: Secondary | ICD-10-CM | POA: Diagnosis not present

## 2015-10-02 DIAGNOSIS — I10 Essential (primary) hypertension: Secondary | ICD-10-CM | POA: Diagnosis not present

## 2015-10-02 DIAGNOSIS — J45909 Unspecified asthma, uncomplicated: Secondary | ICD-10-CM | POA: Diagnosis not present

## 2015-11-15 DIAGNOSIS — E559 Vitamin D deficiency, unspecified: Secondary | ICD-10-CM | POA: Diagnosis not present

## 2015-11-15 DIAGNOSIS — N390 Urinary tract infection, site not specified: Secondary | ICD-10-CM | POA: Diagnosis not present

## 2015-11-15 DIAGNOSIS — E038 Other specified hypothyroidism: Secondary | ICD-10-CM | POA: Diagnosis not present

## 2015-11-15 DIAGNOSIS — I10 Essential (primary) hypertension: Secondary | ICD-10-CM | POA: Diagnosis not present

## 2015-11-15 DIAGNOSIS — E784 Other hyperlipidemia: Secondary | ICD-10-CM | POA: Diagnosis not present

## 2015-11-15 DIAGNOSIS — R8299 Other abnormal findings in urine: Secondary | ICD-10-CM | POA: Diagnosis not present

## 2015-11-22 DIAGNOSIS — R1310 Dysphagia, unspecified: Secondary | ICD-10-CM | POA: Diagnosis not present

## 2015-11-22 DIAGNOSIS — I739 Peripheral vascular disease, unspecified: Secondary | ICD-10-CM | POA: Diagnosis not present

## 2015-11-22 DIAGNOSIS — E784 Other hyperlipidemia: Secondary | ICD-10-CM | POA: Diagnosis not present

## 2015-11-22 DIAGNOSIS — I10 Essential (primary) hypertension: Secondary | ICD-10-CM | POA: Diagnosis not present

## 2015-11-22 DIAGNOSIS — Z6825 Body mass index (BMI) 25.0-25.9, adult: Secondary | ICD-10-CM | POA: Diagnosis not present

## 2015-11-22 DIAGNOSIS — Z0000 Encounter for general adult medical examination without abnormal findings: Secondary | ICD-10-CM | POA: Diagnosis not present

## 2015-11-22 DIAGNOSIS — K5790 Diverticulosis of intestine, part unspecified, without perforation or abscess without bleeding: Secondary | ICD-10-CM | POA: Diagnosis not present

## 2015-11-22 DIAGNOSIS — E038 Other specified hypothyroidism: Secondary | ICD-10-CM | POA: Diagnosis not present

## 2015-11-22 DIAGNOSIS — N3281 Overactive bladder: Secondary | ICD-10-CM | POA: Diagnosis not present

## 2015-11-22 DIAGNOSIS — M8580 Other specified disorders of bone density and structure, unspecified site: Secondary | ICD-10-CM | POA: Diagnosis not present

## 2015-11-22 DIAGNOSIS — R05 Cough: Secondary | ICD-10-CM | POA: Diagnosis not present

## 2015-11-22 DIAGNOSIS — F418 Other specified anxiety disorders: Secondary | ICD-10-CM | POA: Diagnosis not present

## 2015-12-06 ENCOUNTER — Other Ambulatory Visit (HOSPITAL_COMMUNITY): Payer: Medicare Other

## 2015-12-12 DIAGNOSIS — Z23 Encounter for immunization: Secondary | ICD-10-CM | POA: Diagnosis not present

## 2015-12-20 ENCOUNTER — Telehealth: Payer: Self-pay | Admitting: Cardiovascular Disease

## 2015-12-20 NOTE — Telephone Encounter (Signed)
REVIEWED PATIENT 'S CHL CHART - NOTED BYSTOLIC  STARTED [id number]. INFORMATION GIVEN TO PATIENT. VERBALIZED UNDERSTANDING.

## 2015-12-20 NOTE — Telephone Encounter (Signed)
Pt needs to know how long have she been taking Bystolic?

## 2015-12-27 ENCOUNTER — Telehealth: Payer: Self-pay | Admitting: Cardiovascular Disease

## 2015-12-27 NOTE — Telephone Encounter (Signed)
F/u message  Pt states she is returning RN call. Please call back to discuss

## 2015-12-27 NOTE — Telephone Encounter (Signed)
No recent encounters. Called patient back, she realized Mariann Laster was calling but may have been calling her husband about his sleep study results. Requests a call from [facility] tomorrow. Will route msg for Mariann Laster to call back patient.

## 2016-02-01 ENCOUNTER — Emergency Department (HOSPITAL_COMMUNITY)
Admission: EM | Admit: 2016-02-01 | Discharge: 2016-02-01 | Disposition: A | Discharge status: Home / Self Care | Payer: Medicare Other | Attending: Emergency Medicine | Admitting: Emergency Medicine

## 2016-02-01 ENCOUNTER — Emergency Department (HOSPITAL_COMMUNITY): Payer: Medicare Other

## 2016-02-01 ENCOUNTER — Encounter (HOSPITAL_COMMUNITY): Payer: Self-pay | Admitting: *Deleted

## 2016-02-01 DIAGNOSIS — Z96612 Presence of left artificial shoulder joint: Secondary | ICD-10-CM | POA: Insufficient documentation

## 2016-02-01 DIAGNOSIS — J45909 Unspecified asthma, uncomplicated: Secondary | ICD-10-CM | POA: Diagnosis not present

## 2016-02-01 DIAGNOSIS — Z96611 Presence of right artificial shoulder joint: Secondary | ICD-10-CM | POA: Insufficient documentation

## 2016-02-01 DIAGNOSIS — E039 Hypothyroidism, unspecified: Secondary | ICD-10-CM | POA: Insufficient documentation

## 2016-02-01 DIAGNOSIS — Z7982 Long term (current) use of aspirin: Secondary | ICD-10-CM | POA: Insufficient documentation

## 2016-02-01 DIAGNOSIS — R1031 Right lower quadrant pain: Secondary | ICD-10-CM | POA: Insufficient documentation

## 2016-02-01 DIAGNOSIS — Z79899 Other long term (current) drug therapy: Secondary | ICD-10-CM | POA: Insufficient documentation

## 2016-02-01 DIAGNOSIS — I10 Essential (primary) hypertension: Secondary | ICD-10-CM | POA: Insufficient documentation

## 2016-02-01 DIAGNOSIS — Z87891 Personal history of nicotine dependence: Secondary | ICD-10-CM | POA: Diagnosis not present

## 2016-02-01 DIAGNOSIS — R109 Unspecified abdominal pain: Secondary | ICD-10-CM | POA: Diagnosis not present

## 2016-02-01 DIAGNOSIS — K5730 Diverticulosis of large intestine without perforation or abscess without bleeding: Secondary | ICD-10-CM | POA: Diagnosis not present

## 2016-02-01 HISTORY — DX: Other amnesia: R41.3

## 2016-02-01 LAB — LIPASE, BLOOD: Lipase: 32 U/L (ref 11–51)

## 2016-02-01 LAB — CBC
HCT: 44 % (ref 36.0–46.0)
HEMOGLOBIN: 15.3 g/dL — AB (ref 12.0–15.0)
MCH: 30.8 pg (ref 26.0–34.0)
MCHC: 34.8 g/dL (ref 30.0–36.0)
MCV: 88.5 fL (ref 78.0–100.0)
Platelets: 192 10*3/uL (ref 150–400)
RBC: 4.97 MIL/uL (ref 3.87–5.11)
RDW: 13.7 % (ref 11.5–15.5)
WBC: 7.9 10*3/uL (ref 4.0–10.5)

## 2016-02-01 LAB — URINALYSIS, ROUTINE W REFLEX MICROSCOPIC
BILIRUBIN URINE: NEGATIVE
Glucose, UA: NEGATIVE mg/dL
Hgb urine dipstick: NEGATIVE
Ketones, ur: 15 mg/dL — AB
Leukocytes, UA: NEGATIVE
NITRITE: NEGATIVE
Protein, ur: NEGATIVE mg/dL
SPECIFIC GRAVITY, URINE: 1.043 — AB (ref 1.005–1.030)
pH: 6.5 (ref 5.0–8.0)

## 2016-02-01 LAB — COMPREHENSIVE METABOLIC PANEL
ALK PHOS: 81 U/L (ref 38–126)
ALT: 27 U/L (ref 14–54)
ANION GAP: 9 (ref 5–15)
AST: 42 U/L — ABNORMAL HIGH (ref 15–41)
Albumin: 4.3 g/dL (ref 3.5–5.0)
BILIRUBIN TOTAL: 1.4 mg/dL — AB (ref 0.3–1.2)
BUN: 12 mg/dL (ref 6–20)
CALCIUM: 10 mg/dL (ref 8.9–10.3)
CO2: 22 mmol/L (ref 22–32)
Chloride: 106 mmol/L (ref 101–111)
Creatinine, Ser: 1.06 mg/dL — ABNORMAL HIGH (ref 0.44–1.00)
GFR calc non Af Amer: 48 mL/min — ABNORMAL LOW (ref >60)
GFR, EST AFRICAN AMERICAN: 56 mL/min — AB (ref >60)
Glucose, Bld: 105 mg/dL — ABNORMAL HIGH (ref 65–99)
Potassium: 4 mmol/L (ref 3.5–5.1)
Sodium: 137 mmol/L (ref 135–145)
TOTAL PROTEIN: 7.2 g/dL (ref 6.5–8.1)

## 2016-02-01 MED ORDER — ACETAMINOPHEN 500 MG PO TABS: 1000.0000 mg | ORAL_TABLET | Freq: Three times a day (TID) | ORAL | 0 refills | Status: AC

## 2016-02-01 MED ORDER — FENTANYL CITRATE (PF) 100 MCG/2ML IJ SOLN
50.0000 ug | Freq: Once | INTRAMUSCULAR | Status: AC
  Administered 2016-02-01: 50 ug via INTRAVENOUS
  Filled 2016-02-01: qty 2

## 2016-02-01 MED ORDER — IOPAMIDOL (ISOVUE-300) INJECTION 61%
INTRAVENOUS | Status: AC
  Administered 2016-02-01: 100 mL
  Filled 2016-02-01: qty 100

## 2016-02-01 MED ORDER — HYDROCODONE-ACETAMINOPHEN 5-325 MG PO TABS: 1.0000 | ORAL_TABLET | Freq: Three times a day (TID) | ORAL | 0 refills | Status: AC | PRN

## 2016-02-01 MED ORDER — SODIUM CHLORIDE 0.9 % IV BOLUS (SEPSIS)
1000.0000 mL | Freq: Once | INTRAVENOUS | Status: AC
  Administered 2016-02-01: 1000 mL via INTRAVENOUS

## 2016-02-01 NOTE — ED Triage Notes (Signed)
Pt c/o acute onset RLQ pain at 0930.  Denies nausea or changes in bowel or bladder habits.  Hx of diverticulitis with colostomy and colostomy reversal.

## 2016-02-01 NOTE — ED Notes (Signed)
Patient Alert and oriented X4. Stable and ambulatory. Patient verbalized understanding of the discharge instructions.  Patient belongings were taken by the patient.  

## 2016-02-01 NOTE — ED Provider Notes (Signed)
[facility] DEPT Provider Note   CSN: [id number] Arrival date & time: 02/01/16  1233     History   Chief Complaint Chief Complaint  Patient presents with  . Abdominal Pain    HPI CALLEE DRUSCHEL is a 82 y.o. female.  The history is provided by the patient.  Abdominal Pain   This is a new problem. The problem occurs constantly (fluctuating). The pain is located in the RLQ. The pain is severe. Associated symptoms include constipation. Pertinent negatives include anorexia, fever, diarrhea, nausea and vomiting. Associated symptoms comments: No BM or gas since last night. Nothing aggravates the symptoms. Nothing relieves the symptoms. Past workup includes surgery (colectomy for diverticulitis).   Pt reports taking a large dose of MiraLax last night for constipation.  Past Medical History:  Diagnosis Date  . Anxiety   . Arthritis    Right shoulder arthritis  . Asthma   . Carotid artery disease ([place])   . Diverticulitis   . Dysrhythmia   . Headache(784.0)   . Heart murmur   . Hypothyroidism   . Memory impairment   . Osteoarthritis of left shoulder 07/20/2013  . Peripheral vascular disease ([place])    carotid   . Primary osteoarthritis of right shoulder 11/16/2013  . Seasonal allergies     Patient Active Problem List   Diagnosis Date Noted  . Acute encephalopathy 11/18/2013  . Primary osteoarthritis of right shoulder 11/16/2013  . Osteoarthritis of left shoulder 07/20/2013  . Primary localized osteoarthrosis, shoulder region 07/20/2013  . Palpitations 06/06/2013  . Aortic valve sclerosis 06/06/2013  . Mitral annular calcification 06/06/2013  . Carotid stenosis 10/26/2012  . Hyperlipidemia 10/26/2012  . Hypothyroidism 10/26/2012  . HTN (hypertension) 10/26/2012  . S/P cataract surgery 10/26/2012    Past Surgical History:  Procedure Laterality Date  . CAROTID ENDARTERECTOMY Right   . CATARACT EXTRACTION W/ INTRAOCULAR LENS  IMPLANT, BILATERAL    . COLON SURGERY    .  COLON SURGERY  2005   had a colostomy for 6 months   . COLONOSCOPY    . EYE SURGERY Bilateral    cataracts   . GASTRIC RESTRICTION SURGERY Left 2006  . TONSILLECTOMY    . TOTAL SHOULDER ARTHROPLASTY Left 07/20/2013   Procedure: TOTAL SHOULDER ARTHROPLASTY;  Surgeon: [name], MD;  Location: [facility];  Service: Orthopedics;  Laterality: Left;  . TOTAL SHOULDER ARTHROPLASTY Right 11/16/2013   Dr [name]  . TOTAL SHOULDER ARTHROPLASTY Right 11/16/2013   Procedure: RIGHT TOTAL SHOULDER ARTHROPLASTY;  Surgeon: [name], MD;  Location: [facility];  Service: Orthopedics;  Laterality: Right;    OB History    No data available       Home Medications    Prior to Admission medications   Medication Sig Start Date End Date Taking? Authorizing Provider  alendronate (FOSAMAX) 70 MG tablet Take 70 mg by mouth every Monday.  09/07/12  Yes Historical Provider, MD  ALPRAZolam Duanne Moron) 0.5 MG tablet Take 0.5 mg by mouth at bedtime as needed for sleep.    Yes Historical Provider, MD  aspirin EC 81 MG tablet Take 81 mg by mouth daily.   Yes Historical Provider, MD  BYSTOLIC 10 MG tablet TAKE 1 TABLET BY MOUTH EVERY DAY. 03/07/15  Yes [name], MD  calcium carbonate (OS-CAL) 600 MG TABS tablet Take 600 mg by mouth daily with breakfast.    Yes Historical Provider, MD  cetirizine (ZYRTEC) 10 MG tablet Take 10 mg by mouth daily.  Yes Historical Provider, MD  cholecalciferol (VITAMIN D) 1000 UNITS tablet Take 2,000 Units by mouth daily.    Yes Historical Provider, MD  donepezil (ARICEPT) 10 MG tablet Take 10 mg by mouth at bedtime. 01/15/16  Yes Historical Provider, MD  fluticasone (FLONASE) 50 MCG/ACT nasal spray Place 2 sprays into both nostrils daily.  04/28/13  Yes Historical Provider, MD  guaiFENesin (MUCINEX) 600 MG 12 hr tablet Take 1,200 mg by mouth 2 (two) times daily as needed for to loosen phlegm.    Yes Historical Provider, MD  ibuprofen (ADVIL,MOTRIN) 200 MG tablet Take 200 mg by mouth  every 6 (six) hours as needed for headache (pain).   Yes Historical Provider, MD  levothyroxine (SYNTHROID, LEVOTHROID) 137 MCG tablet Take 137 mcg by mouth daily before breakfast.   Yes Historical Provider, MD  MEGARED OMEGA-3 KRILL OIL PO Take 1 capsule by mouth daily.   Yes Historical Provider, MD  Multiple Vitamin (MULTIVITAMIN WITH MINERALS) TABS tablet Take 1 tablet by mouth daily.   Yes Historical Provider, MD  OVER THE COUNTER MEDICATION Place 1 drop into both eyes daily as needed (dry eyes). Over the counter lubricating eye drop   Yes Historical Provider, MD  OVER THE COUNTER MEDICATION Take 1 tablet by mouth every 4 (four) hours as needed (nasal drainage/ allergies). OTC allergy medication   Yes Historical Provider, MD  pantoprazole (PROTONIX) 40 MG tablet Take 40 mg by mouth at bedtime. 12/22/15  Yes Historical Provider, MD  polyethylene glycol (MIRALAX / GLYCOLAX) packet Take 17 g by mouth at bedtime as needed for moderate constipation. Mix in 8 oz liquid and drink   Yes Historical Provider, MD  acetaminophen (TYLENOL) 500 MG tablet Take 2 tablets (1,000 mg total) by mouth every 8 (eight) hours. Do not take more than 4000 mg of acetaminophen (Tylenol) in a 24-hour period. Please note that other medicines that you may be prescribed may have Tylenol as well. 02/01/16 02/06/16  Fatima Blank, MD  HYDROcodone-acetaminophen (NORCO/VICODIN) 5-325 MG tablet Take 1 tablet by mouth every 8 (eight) hours as needed for severe pain (That is not improved by your scheduled acetaminophen regimen). Please do not exceed 4000 mg of acetaminophen (Tylenol) a 24-hour period. Please note that he may be prescribed additional medicine that contains acetaminophen. 02/01/16 02/06/16  Fatima Blank, MD    Family History Family History  Problem Relation Age of Onset  . Cancer - Lung Mother   . Cancer - Other Other     Social History Social History  Substance Use Topics  . Smoking status:  Former Smoker    Packs/day: 0.25    Years: 2.00    Types: Cigarettes    Quit date: 07/13/1963  . Smokeless tobacco: Never Used     Comment: quit aprrox 50 years ago.  . Alcohol use No     Allergies   Patient has no known allergies.   Review of Systems Review of Systems  Constitutional: Negative for fever.  Gastrointestinal: Positive for abdominal pain and constipation. Negative for anorexia, diarrhea, nausea and vomiting.  Ten systems are reviewed and are negative for acute change except as noted in the HPI   Physical Exam Updated Vital Signs BP 125/61 (BP Location: Right Arm)   Pulse 67   Temp 98.1 F (36.7 C) (Oral)   Resp 17   Ht 5' 6.5" (1.689 m)   Wt 159 lb (72.1 kg)   SpO2 100%   BMI 25.28 kg/m   Physical  Exam  Constitutional: She is oriented to person, place, and time. She appears well-developed and well-nourished. No distress.  HENT:  Head: Normocephalic and atraumatic.  Nose: Nose normal.  Eyes: Conjunctivae and EOM are normal. Pupils are equal, round, and reactive to light. Right eye exhibits no discharge. Left eye exhibits no discharge. No scleral icterus.  Neck: Normal range of motion. Neck supple.  Cardiovascular: Normal rate and regular rhythm.  Exam reveals no gallop and no friction rub.   No murmur heard. Pulmonary/Chest: Effort normal and breath sounds normal. No stridor. No respiratory distress. She has no rales.  Abdominal: Soft. She exhibits no distension. There is tenderness in the right lower quadrant and periumbilical area. There is tenderness at McBurney's point. There is no rigidity, no rebound, no guarding, no CVA tenderness and negative Murphy's sign.  +Rovsing's  Musculoskeletal: She exhibits no edema or tenderness.  Neurological: She is alert and oriented to person, place, and time.  Skin: Skin is warm and dry. No rash noted. She is not diaphoretic. No erythema.  Psychiatric: She has a normal mood and affect.  Vitals reviewed.    ED  Treatments / Results  Labs (all labs ordered are listed, but only abnormal results are displayed) Labs Reviewed  COMPREHENSIVE METABOLIC PANEL - Abnormal; Notable for the following:       Result Value   Glucose, Bld 105 (*)    Creatinine, Ser 1.06 (*)    AST 42 (*)    Total Bilirubin 1.4 (*)    GFR calc non Af Amer 48 (*)    GFR calc Af Amer 56 (*)    All other components within normal limits  CBC - Abnormal; Notable for the following:    Hemoglobin 15.3 (*)    All other components within normal limits  URINALYSIS, ROUTINE W REFLEX MICROSCOPIC (NOT AT [facility]) - Abnormal; Notable for the following:    Specific Gravity, Urine 1.043 (*)    Ketones, ur 15 (*)    All other components within normal limits  LIPASE, BLOOD    EKG  EKG Interpretation None       Radiology No results found.  Procedures Procedures (including critical care time)  Medications Ordered in ED Medications  sodium chloride 0.9 % bolus 1,000 mL (0 mLs Intravenous Stopped 02/01/16 2233)  fentaNYL (SUBLIMAZE) injection 50 mcg (50 mcg Intravenous Given 02/01/16 1711)  iopamidol (ISOVUE-300) 61 % injection (100 mLs  Contrast Given 02/01/16 1753)  fentaNYL (SUBLIMAZE) injection 50 mcg (50 mcg Intravenous Given 02/01/16 2059)     Initial Impression / Assessment and Plan / ED Course  I have reviewed the triage vital signs and the nursing notes.  Pertinent labs & imaging results that were available during my care of the patient were reviewed by me and considered in my medical decision making (see chart for details).  Clinical Course     Labs grossly reassuring. No evidence of urinary tract infection. Low suspicion for renal colic. Obtained right upper quadrant ultrasound and CT of the abdomen. Had difficulty obtaining reads from radiology to the system however we did contact radiology who informed us that CT revealed fatty infiltration of the liver. Normal CT appearance of the appendix. Extensive sigmoid  diverticular disease without evidence of inflammatory changes. Right upper quadrant was also obtained which did not reveal any evidence of acute cholecystitis.  Treated symptomatically.  Safe for discharge with strict return precautions.  Final Clinical Impressions(s) / ED Diagnoses   Final diagnoses:  Abdominal pain  Right flank pain   Disposition: Discharge  Condition: Good  I have discussed the results, Dx and Tx plan with the patient who expressed understanding and agree(s) with the plan. Discharge instructions discussed at great length. The patient was given strict return precautions who verbalized understanding of the instructions. No further questions at time of discharge.    Discharge Medication List as of 02/01/2016 11:20 PM    START taking these medications   Details  acetaminophen (TYLENOL) 500 MG tablet Take 2 tablets (1,000 mg total) by mouth every 8 (eight) hours. Do not take more than 4000 mg of acetaminophen (Tylenol) in a 24-hour period. Please note that other medicines that you may be prescribed may have Tylenol as well., Starting Thu 02/01/2016,  Until Tue 02/06/2016, Print    HYDROcodone-acetaminophen (NORCO/VICODIN) 5-325 MG tablet Take 1 tablet by mouth every 8 (eight) hours as needed for severe pain (That is not improved by your scheduled acetaminophen regimen). Please do not exceed 4000 mg of acetaminophen (Tylenol) a 24-hour period. Please note that he may be prescribed additio nal medicine that contains acetaminophen., Starting Thu 02/01/2016, Until Tue 02/06/2016, Print        Follow Up: Prince Solian, MD [place] [id number] [phone]  Schedule an appointment as soon as possible for a visit  in 3-5 days, If symptoms do not improve or  worsen      Fatima Blank, MD 02/02/16 [phone]

## 2016-02-02 NOTE — ED Notes (Signed)
50 mcg of fentanyl wasted with Jasmine RN twice for a total of 100 mcg fentanyl waste

## 2016-02-05 ENCOUNTER — Other Ambulatory Visit: Payer: Self-pay | Admitting: Internal Medicine

## 2016-02-05 DIAGNOSIS — Z1231 Encounter for screening mammogram for malignant neoplasm of breast: Secondary | ICD-10-CM

## 2016-02-12 DIAGNOSIS — Z6824 Body mass index (BMI) 24.0-24.9, adult: Secondary | ICD-10-CM | POA: Diagnosis not present

## 2016-02-12 DIAGNOSIS — I10 Essential (primary) hypertension: Secondary | ICD-10-CM | POA: Diagnosis not present

## 2016-02-12 DIAGNOSIS — R1084 Generalized abdominal pain: Secondary | ICD-10-CM | POA: Diagnosis not present

## 2016-02-12 DIAGNOSIS — R413 Other amnesia: Secondary | ICD-10-CM | POA: Diagnosis not present

## 2016-02-17 ENCOUNTER — Other Ambulatory Visit: Payer: Self-pay | Admitting: Cardiovascular Disease

## 2016-02-18 IMAGING — MR MR HEAD W/O CM
9 of 10 series · 34 of 48 positions shown · non-contrast
Comparison: None.

CLINICAL DATA: Acute encephalopathy. Unsteady. Recent right
shoulder arthroplasty. Rule out stroke.

EXAM:
MRI HEAD WITHOUT CONTRAST
TECHNIQUE: Multiplanar, multiecho pulse sequences of the brain and surrounding
structures were obtained without intravenous contrast.

[Series 3: T1 · sagittal · 5.0mm · 0.47mm/px · 2 of 23 slices shown]
[im 1/23]
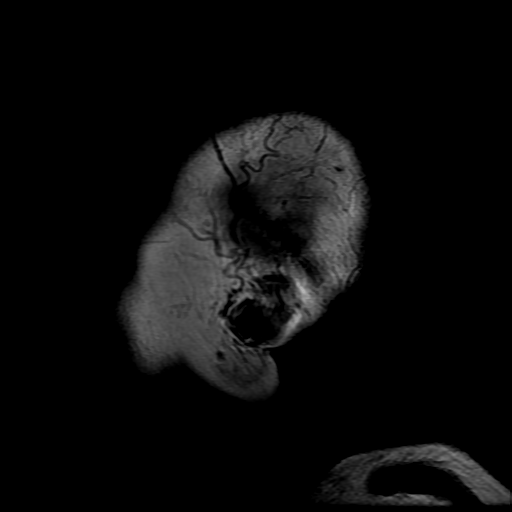
[im 23/23]
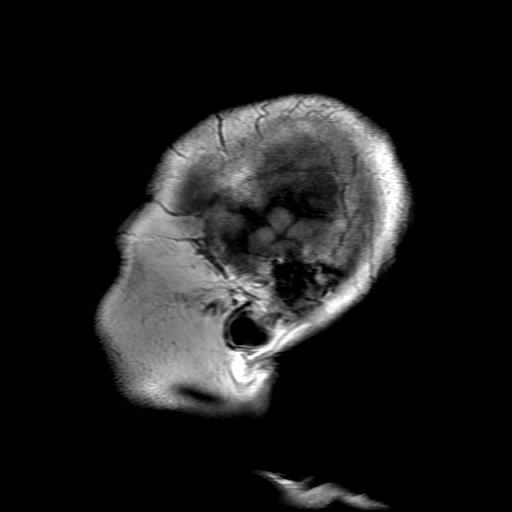

[Series 4: DWI · axial · 5.0mm · 1.09mm/px · z∈[-52,+91]mm · 7 of 60 slices shown (1 of 4)]
[im 1/60]
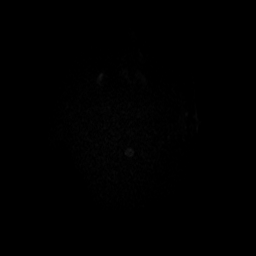
[im 10/60]
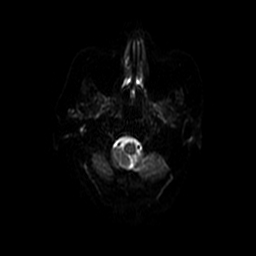
[im 20/60]
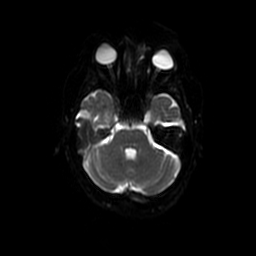
[im 30/60]
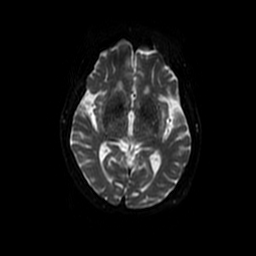
[im 40/60]
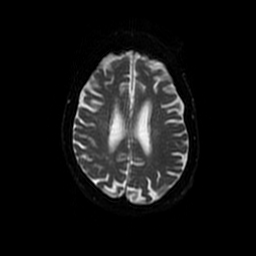
[im 50/60]
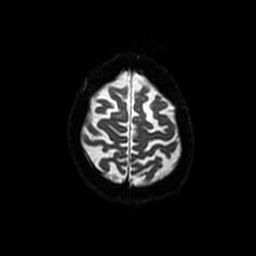
[im 60/60]
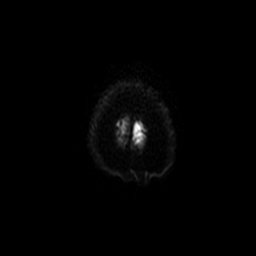

[Series 5: T2 · axial · 5.0mm · 0.43mm/px · z∈[-52,+90]mm · 3 of 25 slices shown (1 of 2)]
[im 1/25]
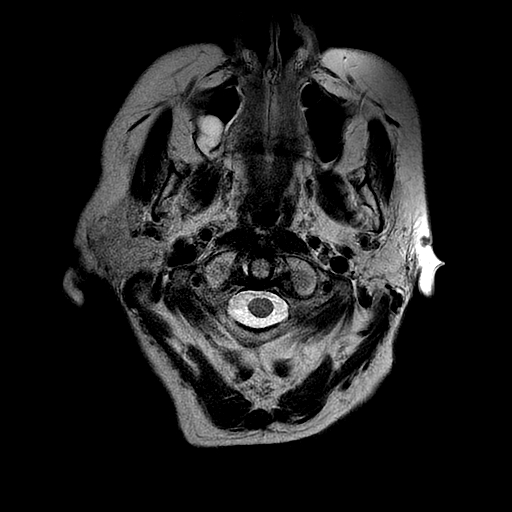
[im 13/25]
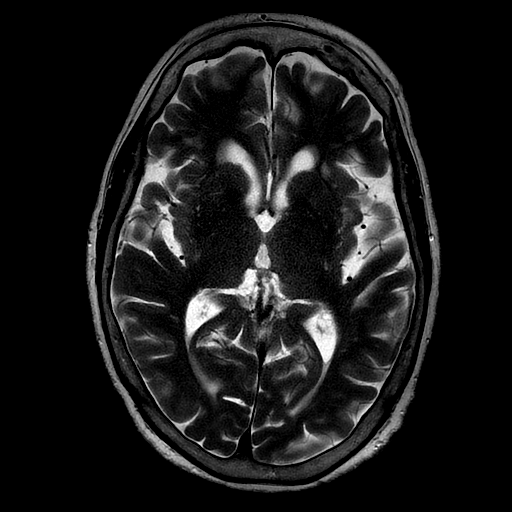
[im 25/25]
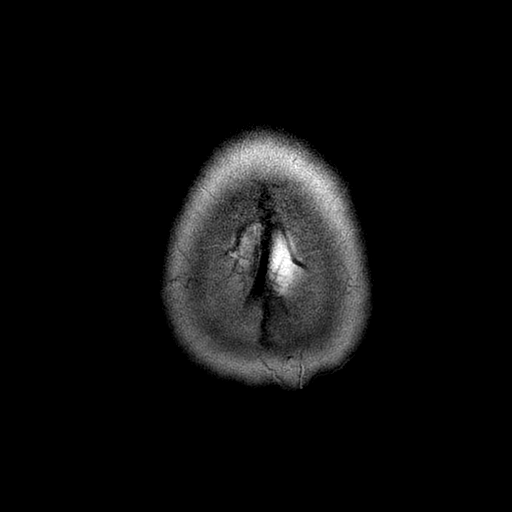

[Series 6: FLAIR · axial · 5.0mm · 0.43mm/px · z∈[-52,+90]mm · 3 of 25 slices shown]
[im 1/25]
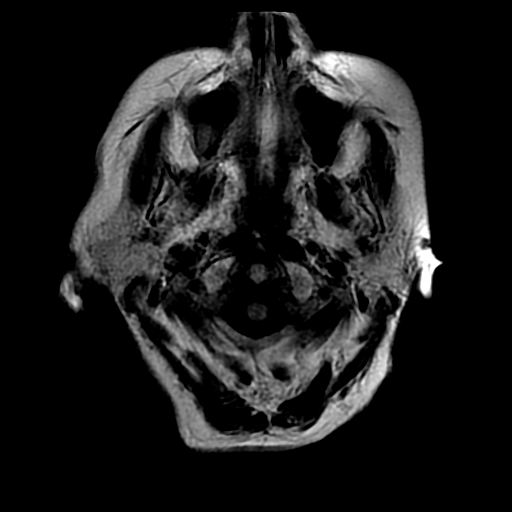
[im 13/25]
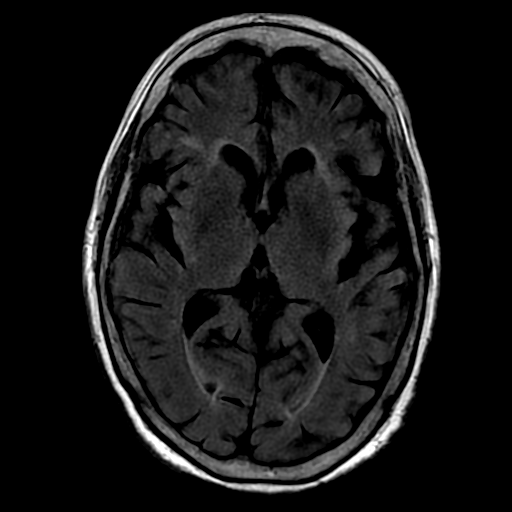
[im 25/25]
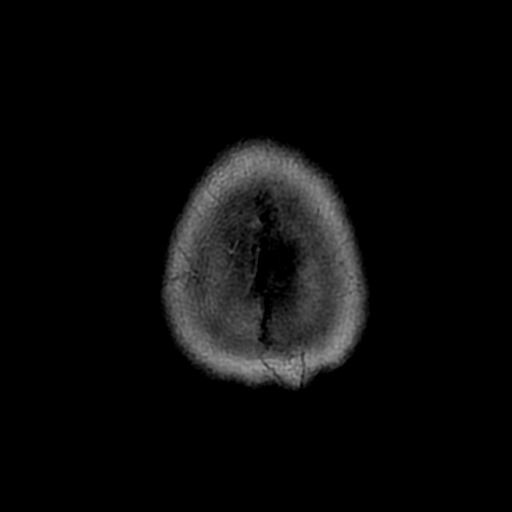

[Series 7: T2 · coronal · 5.0mm · 0.43mm/px · 3 of 30 slices shown (2 of 2)]
[im 1/30]
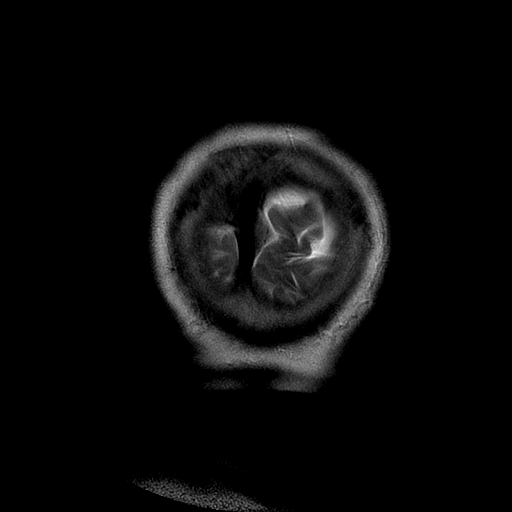
[im 15/30]
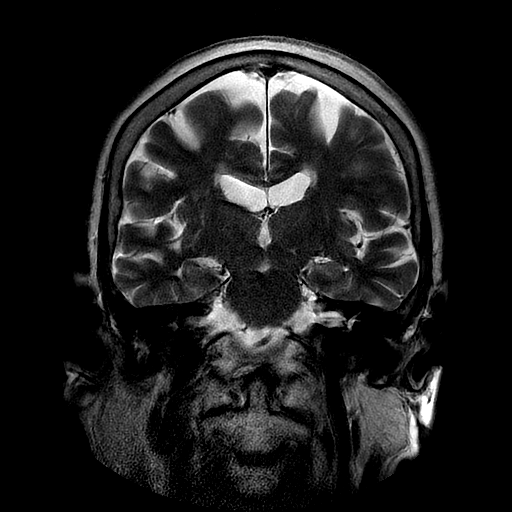
[im 30/30]
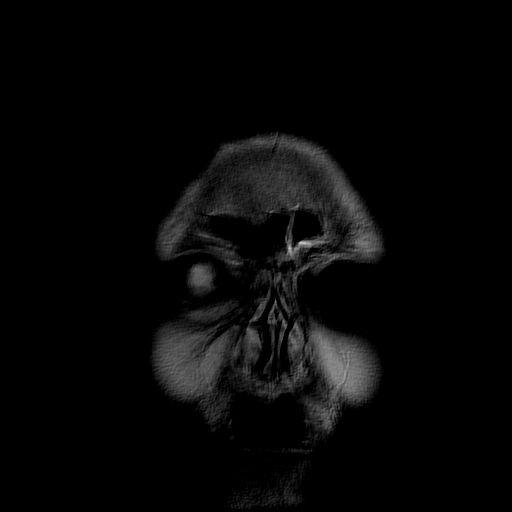

[Series 8: DWI · coronal · 5.0mm · 1.09mm/px · 8 of 66 slices shown (2 of 4)]
[im 1/66]
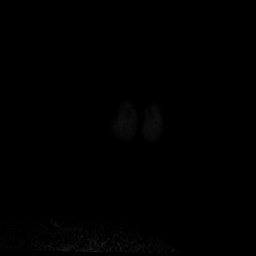
[im 10/66]
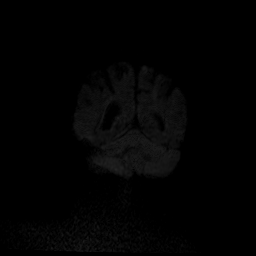
[im 19/66]
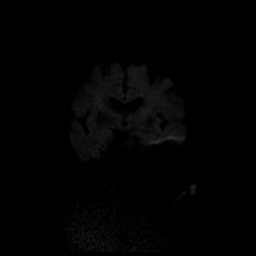
[im 28/66]
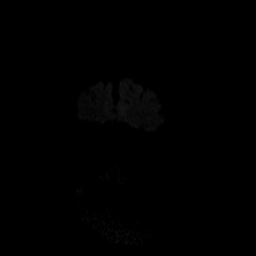
[im 38/66]
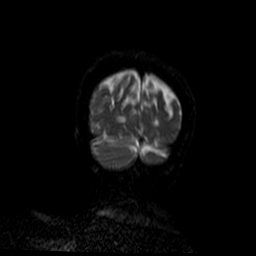
[im 47/66]
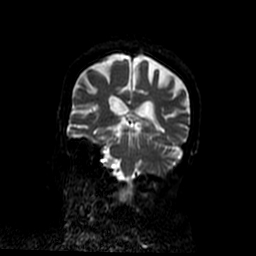
[im 56/66]
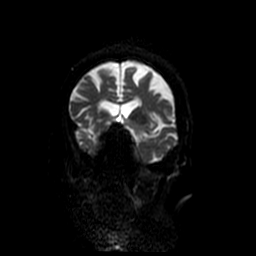
[im 66/66]
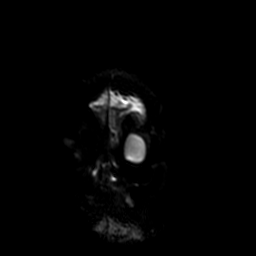

[Series 9: ax mpgr · axial · 5.0mm · 0.43mm/px · 1 of 25 slices shown]
[im 1/25]
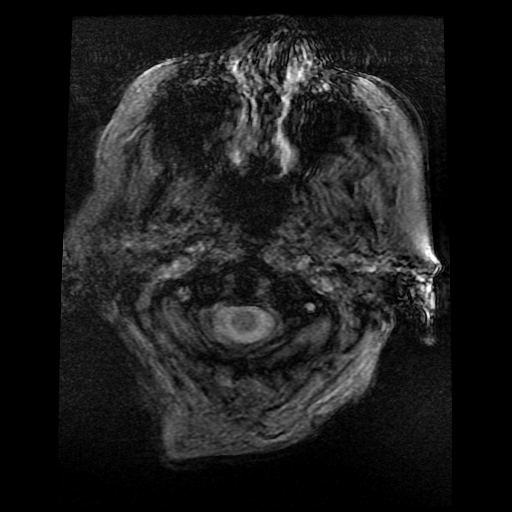

[Series 400: DWI · axial · 5.0mm · 1.09mm/px · z∈[-52,+91]mm · 3 of 30 slices shown (3 of 4)]
[im 1/30]
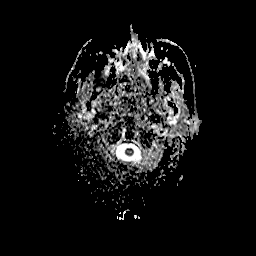
[im 15/30]
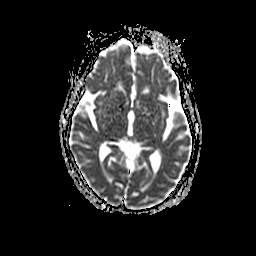
[im 30/30]
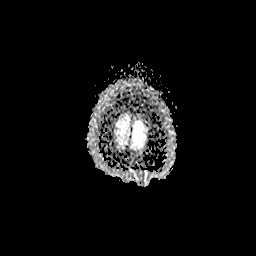

[Series 800: DWI · coronal · 5.0mm · 1.09mm/px · 4 of 33 slices shown (4 of 4)]
[im 1/33]
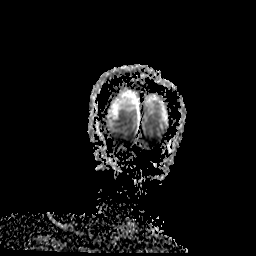
[im 11/33]
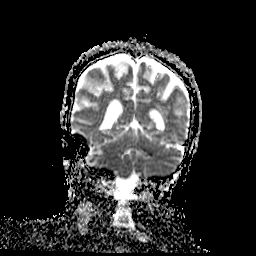
[im 22/33]
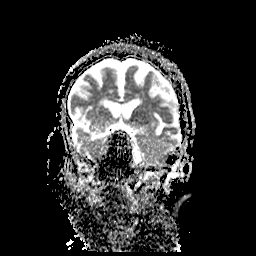
[im 33/33]
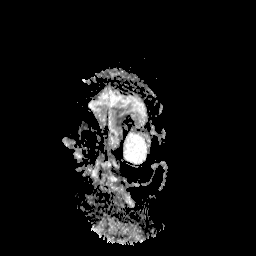

[34 of 48 positions shown; findings below may reference images not displayed]

FINDINGS: Images are mildly degraded by motion artifact.

There is no evidence of acute infarct, intracranial hemorrhage,
mass, midline shift, or extra-axial fluid collection. There is
mild-to-moderate generalized cerebral atrophy. Small foci of T2
hyperintensity in the subcortical and deep cerebral white matter
bilaterally are nonspecific but compatible with mild chronic small
vessel ischemic disease.

Prior bilateral cataract extraction is noted. There is a small right
maxillary sinus mucous retention cyst. Trace right mastoid fluid is
present. Major intracranial vascular flow voids are preserved.
IMPRESSION: 1. No acute intracranial abnormality.
2. Mild chronic small vessel ischemic disease.

## 2016-02-19 NOTE — Telephone Encounter (Signed)
Rx(s) sent to pharmacy electronically.  

## 2016-03-01 ENCOUNTER — Ambulatory Visit
Admission: RE | Admit: 2016-03-01 | Discharge: 2016-03-01 | Disposition: A | Discharge status: Home / Self Care | Payer: Medicare Other | Source: Ambulatory Visit | Attending: Internal Medicine | Admitting: Internal Medicine

## 2016-03-01 DIAGNOSIS — Z1231 Encounter for screening mammogram for malignant neoplasm of breast: Secondary | ICD-10-CM | POA: Diagnosis not present

## 2016-03-19 ENCOUNTER — Other Ambulatory Visit: Payer: Self-pay | Admitting: Cardiovascular Disease

## 2016-04-04 DIAGNOSIS — Z6824 Body mass index (BMI) 24.0-24.9, adult: Secondary | ICD-10-CM | POA: Diagnosis not present

## 2016-04-04 DIAGNOSIS — R413 Other amnesia: Secondary | ICD-10-CM | POA: Diagnosis not present

## 2016-04-04 DIAGNOSIS — R109 Unspecified abdominal pain: Secondary | ICD-10-CM | POA: Diagnosis not present

## 2016-04-04 DIAGNOSIS — J309 Allergic rhinitis, unspecified: Secondary | ICD-10-CM | POA: Diagnosis not present

## 2016-04-04 DIAGNOSIS — I10 Essential (primary) hypertension: Secondary | ICD-10-CM | POA: Diagnosis not present

## 2016-04-04 DIAGNOSIS — E038 Other specified hypothyroidism: Secondary | ICD-10-CM | POA: Diagnosis not present

## 2016-04-04 DIAGNOSIS — F418 Other specified anxiety disorders: Secondary | ICD-10-CM | POA: Diagnosis not present

## 2016-04-15 DIAGNOSIS — H26493 Other secondary cataract, bilateral: Secondary | ICD-10-CM | POA: Diagnosis not present

## 2016-04-17 ENCOUNTER — Encounter: Payer: Self-pay | Admitting: Internal Medicine

## 2016-04-26 DIAGNOSIS — J111 Influenza due to unidentified influenza virus with other respiratory manifestations: Secondary | ICD-10-CM | POA: Diagnosis not present

## 2016-04-26 DIAGNOSIS — J029 Acute pharyngitis, unspecified: Secondary | ICD-10-CM | POA: Diagnosis not present

## 2016-04-26 DIAGNOSIS — I10 Essential (primary) hypertension: Secondary | ICD-10-CM | POA: Diagnosis not present

## 2016-04-26 DIAGNOSIS — R05 Cough: Secondary | ICD-10-CM | POA: Diagnosis not present

## 2016-04-26 DIAGNOSIS — Z6825 Body mass index (BMI) 25.0-25.9, adult: Secondary | ICD-10-CM | POA: Diagnosis not present

## 2016-08-08 DIAGNOSIS — R51 Headache: Secondary | ICD-10-CM | POA: Diagnosis not present

## 2016-08-08 DIAGNOSIS — F418 Other specified anxiety disorders: Secondary | ICD-10-CM | POA: Diagnosis not present

## 2016-08-08 DIAGNOSIS — I10 Essential (primary) hypertension: Secondary | ICD-10-CM | POA: Diagnosis not present

## 2016-08-08 DIAGNOSIS — Z6825 Body mass index (BMI) 25.0-25.9, adult: Secondary | ICD-10-CM | POA: Diagnosis not present

## 2016-08-08 DIAGNOSIS — E038 Other specified hypothyroidism: Secondary | ICD-10-CM | POA: Diagnosis not present

## 2016-08-08 DIAGNOSIS — G44029 Chronic cluster headache, not intractable: Secondary | ICD-10-CM | POA: Diagnosis not present

## 2016-08-08 DIAGNOSIS — R413 Other amnesia: Secondary | ICD-10-CM | POA: Diagnosis not present

## 2016-10-07 DIAGNOSIS — R945 Abnormal results of liver function studies: Secondary | ICD-10-CM | POA: Diagnosis not present

## 2016-10-07 DIAGNOSIS — Z6825 Body mass index (BMI) 25.0-25.9, adult: Secondary | ICD-10-CM | POA: Diagnosis not present

## 2016-10-07 DIAGNOSIS — R413 Other amnesia: Secondary | ICD-10-CM | POA: Diagnosis not present

## 2016-10-07 DIAGNOSIS — R05 Cough: Secondary | ICD-10-CM | POA: Diagnosis not present

## 2016-10-07 DIAGNOSIS — E038 Other specified hypothyroidism: Secondary | ICD-10-CM | POA: Diagnosis not present

## 2016-10-07 DIAGNOSIS — J309 Allergic rhinitis, unspecified: Secondary | ICD-10-CM | POA: Diagnosis not present

## 2016-10-07 DIAGNOSIS — F418 Other specified anxiety disorders: Secondary | ICD-10-CM | POA: Diagnosis not present

## 2016-11-06 DIAGNOSIS — H50312 Intermittent monocular esotropia, left eye: Secondary | ICD-10-CM | POA: Diagnosis not present

## 2016-11-06 DIAGNOSIS — H532 Diplopia: Secondary | ICD-10-CM | POA: Diagnosis not present

## 2016-11-06 DIAGNOSIS — Z961 Presence of intraocular lens: Secondary | ICD-10-CM | POA: Diagnosis not present

## 2016-11-06 DIAGNOSIS — H50311 Intermittent monocular esotropia, right eye: Secondary | ICD-10-CM | POA: Diagnosis not present

## 2016-11-06 DIAGNOSIS — H26493 Other secondary cataract, bilateral: Secondary | ICD-10-CM | POA: Diagnosis not present

## 2016-11-13 DIAGNOSIS — Z961 Presence of intraocular lens: Secondary | ICD-10-CM | POA: Diagnosis not present

## 2016-11-13 DIAGNOSIS — H26492 Other secondary cataract, left eye: Secondary | ICD-10-CM | POA: Diagnosis not present

## 2016-11-27 DIAGNOSIS — M545 Low back pain: Secondary | ICD-10-CM | POA: Diagnosis not present

## 2016-11-27 DIAGNOSIS — M19072 Primary osteoarthritis, left ankle and foot: Secondary | ICD-10-CM | POA: Diagnosis not present

## 2016-11-27 DIAGNOSIS — M19071 Primary osteoarthritis, right ankle and foot: Secondary | ICD-10-CM | POA: Diagnosis not present

## 2016-12-12 DIAGNOSIS — M859 Disorder of bone density and structure, unspecified: Secondary | ICD-10-CM | POA: Diagnosis not present

## 2016-12-12 DIAGNOSIS — I10 Essential (primary) hypertension: Secondary | ICD-10-CM | POA: Diagnosis not present

## 2016-12-12 DIAGNOSIS — E7849 Other hyperlipidemia: Secondary | ICD-10-CM | POA: Diagnosis not present

## 2016-12-12 DIAGNOSIS — E038 Other specified hypothyroidism: Secondary | ICD-10-CM | POA: Diagnosis not present

## 2016-12-15 ENCOUNTER — Emergency Department (HOSPITAL_COMMUNITY)
Admission: EM | Admit: 2016-12-15 | Discharge: 2016-12-15 | Disposition: A | Discharge status: Home / Self Care | Payer: Medicare Other | Attending: Emergency Medicine | Admitting: Emergency Medicine

## 2016-12-15 ENCOUNTER — Emergency Department (HOSPITAL_COMMUNITY): Payer: Medicare Other

## 2016-12-15 ENCOUNTER — Encounter (HOSPITAL_COMMUNITY): Payer: Self-pay | Admitting: Emergency Medicine

## 2016-12-15 DIAGNOSIS — S0083XA Contusion of other part of head, initial encounter: Secondary | ICD-10-CM | POA: Diagnosis not present

## 2016-12-15 DIAGNOSIS — Y999 Unspecified external cause status: Secondary | ICD-10-CM | POA: Insufficient documentation

## 2016-12-15 DIAGNOSIS — W19XXXA Unspecified fall, initial encounter: Secondary | ICD-10-CM

## 2016-12-15 DIAGNOSIS — R51 Headache: Secondary | ICD-10-CM | POA: Diagnosis not present

## 2016-12-15 DIAGNOSIS — Z96611 Presence of right artificial shoulder joint: Secondary | ICD-10-CM | POA: Diagnosis not present

## 2016-12-15 DIAGNOSIS — E039 Hypothyroidism, unspecified: Secondary | ICD-10-CM | POA: Diagnosis not present

## 2016-12-15 DIAGNOSIS — Y9389 Activity, other specified: Secondary | ICD-10-CM | POA: Insufficient documentation

## 2016-12-15 DIAGNOSIS — Y929 Unspecified place or not applicable: Secondary | ICD-10-CM | POA: Diagnosis not present

## 2016-12-15 DIAGNOSIS — S0093XA Contusion of unspecified part of head, initial encounter: Secondary | ICD-10-CM | POA: Diagnosis not present

## 2016-12-15 DIAGNOSIS — Z96612 Presence of left artificial shoulder joint: Secondary | ICD-10-CM | POA: Diagnosis not present

## 2016-12-15 DIAGNOSIS — Z7982 Long term (current) use of aspirin: Secondary | ICD-10-CM | POA: Insufficient documentation

## 2016-12-15 DIAGNOSIS — J45909 Unspecified asthma, uncomplicated: Secondary | ICD-10-CM | POA: Diagnosis not present

## 2016-12-15 DIAGNOSIS — Z87891 Personal history of nicotine dependence: Secondary | ICD-10-CM | POA: Diagnosis not present

## 2016-12-15 DIAGNOSIS — W010XXA Fall on same level from slipping, tripping and stumbling without subsequent striking against object, initial encounter: Secondary | ICD-10-CM | POA: Diagnosis not present

## 2016-12-15 DIAGNOSIS — G4489 Other headache syndrome: Secondary | ICD-10-CM | POA: Diagnosis not present

## 2016-12-15 DIAGNOSIS — S0990XA Unspecified injury of head, initial encounter: Secondary | ICD-10-CM | POA: Diagnosis not present

## 2016-12-15 DIAGNOSIS — Z79899 Other long term (current) drug therapy: Secondary | ICD-10-CM | POA: Diagnosis not present

## 2016-12-15 NOTE — ED Triage Notes (Signed)
[place] ED from [place] via [place]. EMS reports:  Patient tripped over her feet, and fell backwards striking her posterior head. Denies LOC. No neuro deficits.  Arrived with towel around neck for brace by EMS.  No blood thinners. BP 152/74, Pulse 76, Resp 16, 99% on room air.

## 2016-12-15 NOTE — Discharge Instructions (Signed)
Return if any problems.  Ice to area of swelling.  Tylenol if any pain

## 2016-12-15 NOTE — ED Provider Notes (Signed)
[facility] DEPT Provider Note   CSN: [id number] Arrival date & time: 12/15/16  1017     History   Chief Complaint Chief Complaint  Patient presents with  . Fall    HPI Danielle [name] is a 83 y.o. female.  The history is provided by the patient. No language interpreter was used.  Fall  This is a new problem. The current episode started 1 to 2 hours ago. The problem occurs constantly. The problem has not changed since onset.Pertinent negatives include no chest pain and no abdominal pain. Nothing aggravates the symptoms. Nothing relieves the symptoms. She has tried nothing for the symptoms.  Pt reports she slipped and fell while putting a cake in her car to take to church.  Pt reports she hit the back of her head on concrete.  Pt did not lose consciousness.  Pt denies neck or back pain.  No injury to knees, ankle or arms.   Past Medical History:  Diagnosis Date  . Anxiety   . Arthritis    Right shoulder arthritis  . Asthma   . Carotid artery disease ([place])   . Diverticulitis   . Dysrhythmia   . Headache(784.0)   . Heart murmur   . Hypothyroidism   . Memory impairment   . Osteoarthritis of left shoulder 07/20/2013  . Peripheral vascular disease ([place])    carotid   . Primary osteoarthritis of right shoulder 11/16/2013  . Seasonal allergies     Patient Active Problem List   Diagnosis Date Noted  . Acute encephalopathy 11/18/2013  . Primary osteoarthritis of right shoulder 11/16/2013  . Osteoarthritis of left shoulder 07/20/2013  . Primary localized osteoarthrosis, shoulder region 07/20/2013  . Palpitations 06/06/2013  . Aortic valve sclerosis 06/06/2013  . Mitral annular calcification 06/06/2013  . Carotid stenosis 10/26/2012  . Hyperlipidemia 10/26/2012  . Hypothyroidism 10/26/2012  . HTN (hypertension) 10/26/2012  . S/P cataract surgery 10/26/2012    Past Surgical History:  Procedure Laterality Date  . CAROTID ENDARTERECTOMY Right   . CATARACT EXTRACTION W/  INTRAOCULAR LENS  IMPLANT, BILATERAL    . COLON SURGERY    . COLON SURGERY  2005   had a colostomy for 6 months   . COLONOSCOPY    . EYE SURGERY Bilateral    cataracts   . GASTRIC RESTRICTION SURGERY Left 2006  . TONSILLECTOMY    . TOTAL SHOULDER ARTHROPLASTY Left 07/20/2013   Procedure: TOTAL SHOULDER ARTHROPLASTY;  Surgeon: [name], MD;  Location: [facility];  Service: Orthopedics;  Laterality: Left;  . TOTAL SHOULDER ARTHROPLASTY Right 11/16/2013   Dr [name]  . TOTAL SHOULDER ARTHROPLASTY Right 11/16/2013   Procedure: RIGHT TOTAL SHOULDER ARTHROPLASTY;  Surgeon: [name], MD;  Location: [facility];  Service: Orthopedics;  Laterality: Right;    OB History    No data available       Home Medications    Prior to Admission medications   Medication Sig Start Date End Date Taking? Authorizing Provider  alendronate (FOSAMAX) 70 MG tablet Take 70 mg by mouth every Monday.  09/07/12   [provider]  ALPRAZolam Duanne Moron) 0.5 MG tablet Take 0.5 mg by mouth at bedtime as needed for sleep.     [provider]  aspirin EC 81 MG tablet Take 81 mg by mouth daily.    [provider]  BYSTOLIC 10 MG tablet TAKE 1 TABLET(10 MG) BY MOUTH DAILY 03/19/16   [name], MD  calcium carbonate (OS-CAL) 600  MG TABS tablet Take 600 mg by mouth daily with breakfast.     [provider]  cetirizine (ZYRTEC) 10 MG tablet Take 10 mg by mouth daily.    [provider]  cholecalciferol (VITAMIN D) 1000 UNITS tablet Take 2,000 Units by mouth daily.     [provider]  donepezil (ARICEPT) 10 MG tablet Take 10 mg by mouth at bedtime. 01/15/16   [provider]  fluticasone (FLONASE) 50 MCG/ACT nasal spray Place 2 sprays into both nostrils daily.  04/28/13   [provider]  guaiFENesin (MUCINEX) 600 MG 12 hr tablet Take 1,200 mg by mouth 2 (two) times daily as needed for to loosen phlegm.     [provider]  ibuprofen  (ADVIL,MOTRIN) 200 MG tablet Take 200 mg by mouth every 6 (six) hours as needed for headache (pain).    [provider]  levothyroxine (SYNTHROID, LEVOTHROID) 137 MCG tablet Take 137 mcg by mouth daily before breakfast.    [provider]  MEGARED OMEGA-3 KRILL OIL PO Take 1 capsule by mouth daily.    [provider]  Multiple Vitamin (MULTIVITAMIN WITH MINERALS) TABS tablet Take 1 tablet by mouth daily.    [provider]  OVER THE COUNTER MEDICATION Place 1 drop into both eyes daily as needed (dry eyes). Over the counter lubricating eye drop    [provider]  OVER THE COUNTER MEDICATION Take 1 tablet by mouth every 4 (four) hours as needed (nasal drainage/ allergies). OTC allergy medication    [provider]  pantoprazole (PROTONIX) 40 MG tablet Take 40 mg by mouth at bedtime. 12/22/15   [provider]  polyethylene glycol (MIRALAX / GLYCOLAX) packet Take 17 g by mouth at bedtime as needed for moderate constipation. Mix in 8 oz liquid and drink    [provider]    Family History Family History  Problem Relation Age of Onset  . Cancer - Lung Mother   . Cancer - Other Other     Social History Social History  Substance Use Topics  . Smoking status: Former Smoker    Packs/day: 0.25    Years: 2.00    Types: Cigarettes    Quit date: 07/13/1963  . Smokeless tobacco: Never Used     Comment: quit aprrox 50 years ago.  . Alcohol use No     Allergies   Patient has no known allergies.   Review of Systems Review of Systems  Cardiovascular: Negative for chest pain.  Gastrointestinal: Negative for abdominal pain.  All other systems reviewed and are negative.    Physical Exam Updated Vital Signs BP (!) 151/82 (BP Location: Right Arm)   Pulse 73   Temp 98.5 F (36.9 C) (Oral)   Resp 16   Ht 5\' 5"  (1.651 m)   Wt 70.3 kg (155 lb)   SpO2 99%   BMI 25.79 kg/m   Physical Exam  Constitutional: She appears  well-developed and well-nourished. No distress.  HENT:  Head: Normocephalic and atraumatic.  Right Ear: External ear normal.  Left Ear: External ear normal.  Mouth/Throat: Oropharynx is clear and moist.  Eyes: Conjunctivae are normal.  Neck: Neck supple.  Cardiovascular: Normal rate and regular rhythm.   No murmur heard. Pulmonary/Chest: Effort normal and breath sounds normal. No respiratory distress.  Abdominal: Soft. There is no tenderness.  Musculoskeletal: She exhibits no edema.  Neurological: She is alert. No cranial nerve deficit. Coordination normal.  Skin: Skin is warm and  dry.  Psychiatric: She has a normal mood and affect.  Nursing note and vitals reviewed.    ED Treatments / Results  Labs (all labs ordered are listed, but only abnormal results are displayed) Labs Reviewed - No data to display  EKG  EKG Interpretation None       Radiology Ct Head Wo Contrast  Result Date: 12/15/2016 CLINICAL DATA:  [place] backwards striking the back of the head with subsequent headache. EXAM: CT HEAD WITHOUT CONTRAST TECHNIQUE: Contiguous axial images were obtained from the base of the skull through the vertex without intravenous contrast. COMPARISON:  MRI 11/18/2016 FINDINGS: Brain: Generalized brain atrophy. Mild chronic small-vessel ischemic change of the deep white matter. No sign of acute infarction, mass lesion, hemorrhage, hydrocephalus or extra-axial collection. Vascular: There is atherosclerotic calcification of the major vessels at the base of the brain. Skull: No fracture or focal lesion. Sinuses/Orbits: Clear/normal Other: None IMPRESSION: No acute or traumatic finding. Ordinary generalized atrophy with mild chronic small-vessel change of the white matter. Electronically Signed   By: Nelson Chimes M.D.   On: 12/15/2016 11:44    Procedures Procedures (including critical care time)  Medications Ordered in ED Medications - No data to display   Initial Impression /  Assessment and Plan / ED Course  I have reviewed the triage vital signs and the nursing notes.  Pertinent labs & imaging results that were available during my care of the patient were reviewed by me and considered in my medical decision making (see chart for details).       Final Clinical Impressions(s) / ED Diagnoses   Final diagnoses:  Contusion of head, unspecified part of head, initial encounter  Fall, initial encounter    New Prescriptions New Prescriptions   No medications on file   Tylenol for pain if needed.     Fransico Meadow, PA-C 12/15/16 1157    Noemi Chapel, MD 12/15/16 [phone]

## 2016-12-19 DIAGNOSIS — Z1389 Encounter for screening for other disorder: Secondary | ICD-10-CM | POA: Diagnosis not present

## 2016-12-19 DIAGNOSIS — M859 Disorder of bone density and structure, unspecified: Secondary | ICD-10-CM | POA: Diagnosis not present

## 2016-12-19 DIAGNOSIS — I7389 Other specified peripheral vascular diseases: Secondary | ICD-10-CM | POA: Diagnosis not present

## 2016-12-19 DIAGNOSIS — I10 Essential (primary) hypertension: Secondary | ICD-10-CM | POA: Diagnosis not present

## 2016-12-19 DIAGNOSIS — R413 Other amnesia: Secondary | ICD-10-CM | POA: Diagnosis not present

## 2016-12-19 DIAGNOSIS — E038 Other specified hypothyroidism: Secondary | ICD-10-CM | POA: Diagnosis not present

## 2016-12-19 DIAGNOSIS — Z6825 Body mass index (BMI) 25.0-25.9, adult: Secondary | ICD-10-CM | POA: Diagnosis not present

## 2016-12-19 DIAGNOSIS — Z23 Encounter for immunization: Secondary | ICD-10-CM | POA: Diagnosis not present

## 2016-12-19 DIAGNOSIS — K5790 Diverticulosis of intestine, part unspecified, without perforation or abscess without bleeding: Secondary | ICD-10-CM | POA: Diagnosis not present

## 2016-12-19 DIAGNOSIS — F418 Other specified anxiety disorders: Secondary | ICD-10-CM | POA: Diagnosis not present

## 2016-12-19 DIAGNOSIS — Z0000 Encounter for general adult medical examination without abnormal findings: Secondary | ICD-10-CM | POA: Diagnosis not present

## 2016-12-19 DIAGNOSIS — E7849 Other hyperlipidemia: Secondary | ICD-10-CM | POA: Diagnosis not present

## 2016-12-24 DIAGNOSIS — Z1212 Encounter for screening for malignant neoplasm of rectum: Secondary | ICD-10-CM | POA: Diagnosis not present

## 2016-12-25 ENCOUNTER — Ambulatory Visit (INDEPENDENT_AMBULATORY_CARE_PROVIDER_SITE_OTHER): Payer: Medicare Other | Admitting: Cardiovascular Disease

## 2016-12-25 ENCOUNTER — Encounter: Payer: Self-pay | Admitting: Cardiovascular Disease

## 2016-12-25 VITALS — BP 119/65 | HR 57 | Ht 65.0 in | Wt 159.9 lb

## 2016-12-25 DIAGNOSIS — E039 Hypothyroidism, unspecified: Secondary | ICD-10-CM

## 2016-12-25 DIAGNOSIS — I358 Other nonrheumatic aortic valve disorders: Secondary | ICD-10-CM

## 2016-12-25 DIAGNOSIS — I10 Essential (primary) hypertension: Secondary | ICD-10-CM

## 2016-12-25 DIAGNOSIS — Z9889 Other specified postprocedural states: Secondary | ICD-10-CM

## 2016-12-25 DIAGNOSIS — E785 Hyperlipidemia, unspecified: Secondary | ICD-10-CM | POA: Diagnosis not present

## 2016-12-25 DIAGNOSIS — R011 Cardiac murmur, unspecified: Secondary | ICD-10-CM | POA: Diagnosis not present

## 2016-12-25 MED ORDER — NEBIVOLOL HCL 5 MG PO TABS: 5.0000 mg | ORAL_TABLET | Freq: Every day | ORAL | 3 refills | Status: DC

## 2016-12-25 NOTE — Patient Instructions (Signed)
Medication Instructions:  DECREASE Bystolic to 5 mg daily  Testing/Procedures: Your physician has requested that you have an echocardiogram. Echocardiography is a painless test that uses sound waves to create images of your heart. It provides your doctor with information about the size and shape of your heart and how well your heart's chambers and valves are working. This procedure takes approximately one hour. There are no restrictions for this procedure. This will be done at our [facility] location:  [place] has requested that you have a carotid duplex. This test is an ultrasound of the carotid arteries in your neck. It looks at blood flow through these arteries that supply the brain with blood. Allow one hour for this exam. There are no restrictions or special instructions.  Follow-Up: Your physician wants you to follow-up in: 6 months with Dr. [name].  You will receive a reminder letter in the mail two months in advance. If you don't receive a letter, please call our office to schedule the follow-up appointment.   Any Other Special Instructions Will Be Listed Below (If Applicable).     If you need a refill on your cardiac medications before your next appointment, please call your pharmacy.

## 2016-12-25 NOTE — Progress Notes (Signed)
Patient ID: Danielle [name], female   DOB: [birth year], 83 y.o.   MRN: [id number]     HPI: Danielle [name] is a 83 y.o. female who presents to the office today for a 2 year cardiology evaluation.   Danielle [name] has a history of peripheral vascular disease and is status post right carotid endarterectomy in 2005. She also has a history of hypothyroidism, hypertension, palpitations, hyperlipidemia and has intermittent seasonal allergies. In January 2011 Echo Doppler study showed normal systolic function with grade 1 diastolic dysfunction, mild MR and TR and mild aortic sclerosis. A nuclear perfusion study at that time revealed normal perfusion.  A carotid duplex study was in January 2012 was was mildly abnormal with irregular mixed density plaque but was without significant additional stenosis.  She sees Dr Dagmar Hait, for primary care. Laboratory in September 2013 showed a cholesterol of 136 triglyceride 64 HDL 50 LDL 73. Normal renal function. She was not anemic.  Danielle [name] underwent  bilateral cataract surgery last summer by Dr. Prudencio Burly at [facility]. She tolerated surgery well from a cardiovascular standpoint. She denies recent leg swelling although she does have a prescription for Lasix. She is tolerating her Bystolic and denies any recent palpitations.   Her last echo Doppler study was in October 2014 and this revealed normal systolic function with an ejection fraction of 60-65%.  She did not have regional wall motion abnormalities.  There is grade 1 diastolic dysfunction.  There was evidence for aortic valve sclerosis without stenosis, mild to moderate mitral regurgitation, and mild tricuspid regurgitation.  She had mild pulmonary hypertension with an estimated RV systolic pressure at 33 mm.  Prior to planned left shoulder replacement in May 2015, she underwent a preoperative nuclear perfusion study  in April 2015.  This was low risk and showed some basal septal, artifact without scar or  ischemia.  Danielle [name] underwent left shoulder surgery and on 07/20/2013 and on 11/16/2013 underwent right shoulder replacement surgery by Dr. [name].  She did have some confusion, which may be medication related.  Following her second surgery.  She tolerated both of these were cardiovascular standpoint.  Since I last saw her 2 years ago, she has been without any episodes of chest pain.  She tells me that she had recently seen Dr. Dagmar Hait.  Laboratory was performed.  She was found to have an over suppressed thyroid with a TSH of 0.09 and her Synthroid dose was reduced from 137 g to 125 g.  In addition, her LDL was increased at 121 and apparently she was just started on a prescription of very low-dose rosuvastatin at 5 mg.  She tells me that last week she tripped and fell backwards and hit her head.  She underwent an CT evaluation on 12/15/2016, which did not show any acute traumatic finding.  There was ordinary.  Generalized atrophy with mild chronic small vessel changes of the white matter.  She is unaware of palpitations.  Her last echo Doppler study and carotid Doppler studies were done in 2014.  She presents for evaluation.   Past Medical History:  Diagnosis Date  . Anxiety   . Arthritis    Right shoulder arthritis  . Asthma   . Carotid artery disease ([place])   . Diverticulitis   . Dysrhythmia   . Headache(784.0)   . Heart murmur   . Hypothyroidism   . Memory impairment   . Osteoarthritis of left shoulder 07/20/2013  . Peripheral vascular disease ([place])  carotid   . Primary osteoarthritis of right shoulder 11/16/2013  . Seasonal allergies     Past surgical history is notable for right carotid endarterectomy and recent bilateral cataract surgeries.  No Known Allergies  Current Outpatient Prescriptions  Medication Sig Dispense Refill  . alendronate (FOSAMAX) 70 MG tablet Take 70 mg by mouth every Monday.     . ALPRAZolam (XANAX) 0.5 MG tablet Take 0.5 mg by mouth at bedtime as needed for  sleep.     Marland Kitchen aspirin EC 81 MG tablet Take 81 mg by mouth daily.    . calcium carbonate (OS-CAL) 600 MG TABS tablet Take 600 mg by mouth daily with breakfast.     . cetirizine (ZYRTEC) 10 MG tablet Take 10 mg by mouth daily.    . cholecalciferol (VITAMIN D) 1000 UNITS tablet Take 2,000 Units by mouth daily.     Marland Kitchen donepezil (ARICEPT) 10 MG tablet Take 10 mg by mouth at bedtime.    . fluticasone (FLONASE) 50 MCG/ACT nasal spray Place 2 sprays into both nostrils daily.     Marland Kitchen guaiFENesin (MUCINEX) 600 MG 12 hr tablet Take 1,200 mg by mouth 2 (two) times daily as needed for to loosen phlegm.     Marland Kitchen ibuprofen (ADVIL,MOTRIN) 200 MG tablet Take 200 mg by mouth every 6 (six) hours as needed for headache (pain).    Marland Kitchen levothyroxine (SYNTHROID, LEVOTHROID) 125 MCG tablet Take 125 mcg by mouth daily before breakfast.    . MEGARED OMEGA-3 KRILL OIL PO Take 1 capsule by mouth daily.    . Multiple Vitamin (MULTIVITAMIN WITH MINERALS) TABS tablet Take 1 tablet by mouth daily.    . nebivolol (BYSTOLIC) 5 MG tablet Take 1 tablet (5 mg total) by mouth daily. 90 tablet 3  . OVER THE COUNTER MEDICATION Place 1 drop into both eyes daily as needed (dry eyes). Over the counter lubricating eye drop    . OVER THE COUNTER MEDICATION Take 1 tablet by mouth every 4 (four) hours as needed (nasal drainage/ allergies). OTC allergy medication    . pantoprazole (PROTONIX) 40 MG tablet Take 40 mg by mouth at bedtime.  6  . polyethylene glycol (MIRALAX / GLYCOLAX) packet Take 17 g by mouth at bedtime as needed for moderate constipation. Mix in 8 oz liquid and drink    . rosuvastatin (CRESTOR) 5 MG tablet Take 5 mg by mouth daily.     No current facility-administered medications for this visit.     Social History   Social History  . Marital status: Married    Spouse name: N/A  . Number of children: N/A  . Years of education: N/A   Occupational History  . Not on file.   Social History Main Topics  . Smoking status: Former  Smoker    Packs/day: 0.25    Years: 2.00    Types: Cigarettes    Quit date: 07/13/1963  . Smokeless tobacco: Never Used     Comment: quit aprrox 50 years ago.  . Alcohol use No  . Drug use: No  . Sexual activity: Not on file   Other Topics Concern  . Not on file   Social History Narrative  . No narrative on file    Family history is notable that both parents are deceased mother at age 60 with cancer father at age 65. Her brother is deceased also and had various cancers. His sister is also deceased.   ROS General: Negative; No fevers, chills, or night sweats;  HEENT: Vision has significantly improved following cataract surgery No changes in vision or hearing, sinus congestion, difficulty swallowing Pulmonary: Negative; No cough, wheezing, shortness of breath, hemoptysis Cardiovascular: Negative; No chest pain, presyncope, syncope, palpitations GI: Negative; No nausea, vomiting, diarrhea, or abdominal pain GU: Negative; No dysuria, hematuria, or difficulty voiding Musculoskeletal: Positive for osteopenia versus osteoporosis.  She is status post bilateral sequential shoulder replacement surgeries;  Hematologic/Oncology: Negative; no easy bruising, bleeding Endocrine: Positive for hypothyroidism Neuro: Negative; no changes in balance, headaches Skin: Negative; No rashes or skin lesions Psychiatric: Negative; No behavioral problems, depression Sleep: Negative; No snoring, daytime sleepiness, hypersomnolence, bruxism, restless legs, hypnogognic hallucinations, no cataplexy Other comprehensive 14 point system review is negative.   PE BP 119/65   Pulse (!) 57   Ht 5' 5"  (1.651 m)   Wt 159 lb 14.4 oz (72.5 kg)   BMI 26.61 kg/m    Repeat blood pressure by me was 118/70 supine and 106/68 standing.  Wt Readings from Last 3 Encounters:  12/25/16 159 lb 14.4 oz (72.5 kg)  12/15/16 155 lb (70.3 kg)  02/01/16 159 lb (72.1 kg)   General: Alert, oriented, no distress.  Skin: normal  turgor, no rashes, warm and dry HEENT: Normocephalic, atraumatic. Pupils equal round and reactive to light; sclera anicteric; extraocular muscles intact;  Nose without nasal septal hypertrophy Mouth/Parynx benign; Mallinpatti scale Neck: No JVD, right carotid endarterectomy scar.  no carotid bruits; normal carotid upstroke Lungs: clear to ausculatation and percussion; no wheezing or rales Chest wall: without tenderness to palpitation Heart: PMI not displaced, RRR, s1 s2 normal, 7-2/8 systolic murmur in the aortic area left sternal border., no diastolic murmur, no rubs, gallops, thrills, or heaves Abdomen: soft, nontender; no hepatosplenomehaly, BS+; abdominal aorta nontender and not dilated by palpation. Back: no CVA tenderness Pulses 2+ Musculoskeletal: full range of motion, normal strength, no joint deformities Extremities: no clubbing cyanosis or edema, Homan's sign negative  Neurologic: grossly nonfocal; Cranial nerves grossly wnl Psychologic: Normal mood and affect   ECG (independently read by me): Sinus bradycardia at 57 bpm.  First-degree AV block with a PR interval of 222 ms.  No significant ST-T changes.  September 2016 ECG (independently read by me):  Normal sinus rhythm at 67 bpm.  No ectopy.    Ffirst-degree AV block  PR interval at 206 ms.  ECG (independently read by me): Sinus rhythm with first-degree AV block.  Prior March 2015 ECG (independently read by me): Normal sinus rhythm with PACs and a transient trigeminal rhythm.  ECG: Sinus rhythm 84 beats per minute period. We'll 168 ms. QTc interval 470 ms. No significant ST-T changes.  LABS: BMP Latest Ref Rng & Units 02/01/2016 11/18/2013 11/17/2013  Glucose 65 - 99 mg/dL 105(H) 110(H) 99  BUN 6 - 20 mg/dL 12 12 10   Creatinine 0.44 - 1.00 mg/dL 1.06(H) 0.81 0.73  Sodium 135 - 145 mmol/L 137 136(L) 134(L)  Potassium 3.5 - 5.1 mmol/L 4.0 3.9 4.5  Chloride 101 - 111 mmol/L 106 100 99  CO2 22 - 32 mmol/L 22 24 20   Calcium  8.9 - 10.3 mg/dL 10.0 8.9 9.0   Hepatic Function Latest Ref Rng & Units 02/01/2016 11/18/2013 12/11/2008  Total Protein 6.5 - 8.1 g/dL 7.2 6.8 7.4  Albumin 3.5 - 5.0 g/dL 4.3 3.3(L) 4.1  AST 15 - 41 U/L 42(H) 29 34  ALT 14 - 54 U/L 27 15 27   Alk Phosphatase 38 - 126 U/L 81 81 83  Total Bilirubin 0.3 -  1.2 mg/dL 1.4(H) 1.6(H) 1.1   CBC Latest Ref Rng & Units 02/01/2016 11/18/2013 11/17/2013  WBC 4.0 - 10.5 K/uL 7.9 8.8 10.7(H)  Hemoglobin 12.0 - 15.0 g/dL 15.3(H) 13.1 14.0  Hematocrit 36.0 - 46.0 % 44.0 38.7 42.4  Platelets 150 - 400 K/uL 192 172 198   Lab Results  Component Value Date   MCV 88.5 02/01/2016   MCV 88.0 11/18/2013   MCV 91.4 11/17/2013   Lipid Panel  No results found for: CHOL, TRIG, HDL, CHOLHDL, VLDL, LDLCALC, LDLDIRECT   RADIOLOGY: No results found.  IMPRESSION:  1. Hypertension, unspecified type   2. Aortic valve sclerosis   3. S/P carotid endarterectomy   4. Heart murmur, systolic   5. Hypothyroidism, unspecified type   6. Hyperlipidemia with target LDL less than 70     ASSESSMENT AND PLAN: Danielle [name] is a 83 year old female has a history of  hypertension, hyperlipidemia, tachypalpitations, and peripheral vascular disease. She is  status post right carotid carotid endarterectomy in 2003.Marland Kitchen She has a cardiac murmur suggestive of aortic sclerosis. Her last echo Doppler study was in 2014 and showed normal systolic function. She did have aortic valve sclerosis without stenosis with trace aortic insufficiency. She also had mitral annular calcification with mild to moderate central regurgitation. There was grade 1 diastolic dysfunction.  She has been on Bystolic.  Her ECG today is stable.  She isnot having ectopy.  She denies recent palpitations.  She had recently had a fall and landed on the back of her head.  Her CT did not show any acute abnormality.  On exam today she is bradycardic and has first-degree AV block.  She had mild orthostasis which was not  clinically significant, but her blood pressure dropped 12-14 mm, going from supine to standing.  She has not had any palpitations.  I am recommending she reduce her Bystolic from 10 mg down to 5 mg daily.  If she notes more palpitations she may need to increase this to 7.5 mg.  She was recently found to have an over suppressed thyroid and her TSH was 0.09 leading to reduction in her levothyroxine dose to 125 g.  I reviewed the recent lipid studies.  Her LDL is 121.  Target LDL is less than 70.  I had recommended initiation of Crestor 10 mg until we were able to find out that she was just started on Crestor 5 mg.  I will have Dr. Peggye Ley laboratory on his 5 mg regimen but if her LDL is greater than 70.  I would recommend further titration.  He is been over 4 years since she has had both echo and carotid duplex screening.  I am  scheduling her for an echocardiographic evaluation as well as follow-up carotid Doppler studies.  These will be done prior to the end of the year.  I will contact her regarding results.  I will see her in 6 months for reevaluation.  Time spent: 25 minutes  [name], MD, [facility]  12/25/2016 11:26 AM

## 2017-01-02 ENCOUNTER — Other Ambulatory Visit: Payer: Self-pay

## 2017-01-02 ENCOUNTER — Ambulatory Visit (HOSPITAL_COMMUNITY): Payer: Medicare Other | Attending: Cardiovascular Disease

## 2017-01-02 DIAGNOSIS — I420 Dilated cardiomyopathy: Secondary | ICD-10-CM | POA: Insufficient documentation

## 2017-01-02 DIAGNOSIS — I358 Other nonrheumatic aortic valve disorders: Secondary | ICD-10-CM

## 2017-01-02 DIAGNOSIS — I080 Rheumatic disorders of both mitral and aortic valves: Secondary | ICD-10-CM | POA: Diagnosis not present

## 2017-01-02 DIAGNOSIS — R011 Cardiac murmur, unspecified: Secondary | ICD-10-CM | POA: Diagnosis not present

## 2017-01-02 DIAGNOSIS — I501 Left ventricular failure: Secondary | ICD-10-CM | POA: Diagnosis not present

## 2017-01-13 ENCOUNTER — Encounter (HOSPITAL_COMMUNITY): Payer: Medicare Other

## 2017-01-15 ENCOUNTER — Ambulatory Visit (HOSPITAL_COMMUNITY)
Admission: RE | Admit: 2017-01-15 | Discharge: 2017-01-15 | Disposition: A | Discharge status: Home / Self Care | Payer: Medicare Other | Source: Ambulatory Visit | Attending: Cardiology | Admitting: Cardiology

## 2017-01-15 DIAGNOSIS — E785 Hyperlipidemia, unspecified: Secondary | ICD-10-CM | POA: Diagnosis not present

## 2017-01-15 DIAGNOSIS — I779 Disorder of arteries and arterioles, unspecified: Secondary | ICD-10-CM | POA: Diagnosis present

## 2017-01-15 DIAGNOSIS — Z87891 Personal history of nicotine dependence: Secondary | ICD-10-CM | POA: Insufficient documentation

## 2017-01-15 DIAGNOSIS — Z9889 Other specified postprocedural states: Secondary | ICD-10-CM | POA: Insufficient documentation

## 2017-01-15 DIAGNOSIS — I6523 Occlusion and stenosis of bilateral carotid arteries: Secondary | ICD-10-CM | POA: Insufficient documentation

## 2017-01-15 DIAGNOSIS — I10 Essential (primary) hypertension: Secondary | ICD-10-CM | POA: Diagnosis not present

## 2017-01-18 DIAGNOSIS — J0140 Acute pansinusitis, unspecified: Secondary | ICD-10-CM | POA: Diagnosis not present

## 2017-01-18 DIAGNOSIS — D485 Neoplasm of uncertain behavior of skin: Secondary | ICD-10-CM | POA: Diagnosis not present

## 2017-01-21 ENCOUNTER — Telehealth: Payer: Self-pay | Admitting: *Deleted

## 2017-01-21 NOTE — Telephone Encounter (Signed)
-----   Message from [name], MD sent at 01/19/2017  5:40 PM EST ----- Normal LV function.  Increased LV filling pressure.  Mild AR.  Mild MR.  Mild LA dilation.  Mild pulmonary hypertension.

## 2017-01-21 NOTE — Telephone Encounter (Signed)
-----   Message from [name], MD sent at 01/19/2017  4:14 PM EST ----- Stable carotid studies, mild plaque bilaterally; status post right carotid endarterectomy

## 2017-01-21 NOTE — Telephone Encounter (Signed)
Patient informed. 

## 2017-01-26 DIAGNOSIS — L821 Other seborrheic keratosis: Secondary | ICD-10-CM | POA: Diagnosis not present

## 2017-01-28 ENCOUNTER — Other Ambulatory Visit: Payer: Self-pay | Admitting: Internal Medicine

## 2017-01-28 DIAGNOSIS — Z1231 Encounter for screening mammogram for malignant neoplasm of breast: Secondary | ICD-10-CM

## 2017-02-03 DIAGNOSIS — L821 Other seborrheic keratosis: Secondary | ICD-10-CM | POA: Diagnosis not present

## 2017-02-26 ENCOUNTER — Ambulatory Visit
Admission: RE | Admit: 2017-02-26 | Discharge: 2017-02-26 | Disposition: A | Discharge status: Home / Self Care | Payer: Medicare Other | Source: Ambulatory Visit | Attending: Internal Medicine | Admitting: Internal Medicine

## 2017-02-26 DIAGNOSIS — Z1231 Encounter for screening mammogram for malignant neoplasm of breast: Secondary | ICD-10-CM

## 2017-03-19 DIAGNOSIS — Z961 Presence of intraocular lens: Secondary | ICD-10-CM | POA: Diagnosis not present

## 2017-03-19 DIAGNOSIS — H532 Diplopia: Secondary | ICD-10-CM | POA: Diagnosis not present

## 2017-03-19 DIAGNOSIS — H5032 Intermittent alternating esotropia: Secondary | ICD-10-CM | POA: Diagnosis not present

## 2017-03-27 ENCOUNTER — Ambulatory Visit
Admission: RE | Admit: 2017-03-27 | Discharge: 2017-03-27 | Disposition: A | Discharge status: Home / Self Care | Payer: Medicare Other | Source: Ambulatory Visit | Attending: Internal Medicine | Admitting: Internal Medicine

## 2017-03-27 DIAGNOSIS — Z1231 Encounter for screening mammogram for malignant neoplasm of breast: Secondary | ICD-10-CM | POA: Diagnosis not present

## 2017-04-03 ENCOUNTER — Other Ambulatory Visit: Payer: Self-pay

## 2017-04-22 DIAGNOSIS — J069 Acute upper respiratory infection, unspecified: Secondary | ICD-10-CM | POA: Diagnosis not present

## 2017-04-22 DIAGNOSIS — R51 Headache: Secondary | ICD-10-CM | POA: Diagnosis not present

## 2017-04-22 DIAGNOSIS — I10 Essential (primary) hypertension: Secondary | ICD-10-CM | POA: Diagnosis not present

## 2017-04-22 DIAGNOSIS — J309 Allergic rhinitis, unspecified: Secondary | ICD-10-CM | POA: Diagnosis not present

## 2017-04-22 DIAGNOSIS — R062 Wheezing: Secondary | ICD-10-CM | POA: Diagnosis not present

## 2017-04-22 DIAGNOSIS — R05 Cough: Secondary | ICD-10-CM | POA: Diagnosis not present

## 2017-04-22 DIAGNOSIS — Z6824 Body mass index (BMI) 24.0-24.9, adult: Secondary | ICD-10-CM | POA: Diagnosis not present

## 2017-04-22 DIAGNOSIS — J45909 Unspecified asthma, uncomplicated: Secondary | ICD-10-CM | POA: Diagnosis not present

## 2017-08-15 ENCOUNTER — Other Ambulatory Visit: Payer: Self-pay | Admitting: *Deleted

## 2017-08-15 DIAGNOSIS — I6523 Occlusion and stenosis of bilateral carotid arteries: Secondary | ICD-10-CM

## 2017-08-15 DIAGNOSIS — Z9889 Other specified postprocedural states: Secondary | ICD-10-CM

## 2017-08-25 DIAGNOSIS — E038 Other specified hypothyroidism: Secondary | ICD-10-CM | POA: Diagnosis not present

## 2017-08-25 DIAGNOSIS — E7849 Other hyperlipidemia: Secondary | ICD-10-CM | POA: Diagnosis not present

## 2017-08-26 DIAGNOSIS — J069 Acute upper respiratory infection, unspecified: Secondary | ICD-10-CM | POA: Diagnosis not present

## 2017-08-26 DIAGNOSIS — Z1389 Encounter for screening for other disorder: Secondary | ICD-10-CM | POA: Diagnosis not present

## 2017-08-26 DIAGNOSIS — F418 Other specified anxiety disorders: Secondary | ICD-10-CM | POA: Diagnosis not present

## 2017-08-26 DIAGNOSIS — Z6824 Body mass index (BMI) 24.0-24.9, adult: Secondary | ICD-10-CM | POA: Diagnosis not present

## 2017-08-26 DIAGNOSIS — R413 Other amnesia: Secondary | ICD-10-CM | POA: Diagnosis not present

## 2017-08-26 DIAGNOSIS — E038 Other specified hypothyroidism: Secondary | ICD-10-CM | POA: Diagnosis not present

## 2017-08-26 DIAGNOSIS — I10 Essential (primary) hypertension: Secondary | ICD-10-CM | POA: Diagnosis not present

## 2017-08-26 DIAGNOSIS — E7849 Other hyperlipidemia: Secondary | ICD-10-CM | POA: Diagnosis not present

## 2017-08-26 DIAGNOSIS — J3089 Other allergic rhinitis: Secondary | ICD-10-CM | POA: Diagnosis not present

## 2017-11-18 DIAGNOSIS — H9201 Otalgia, right ear: Secondary | ICD-10-CM | POA: Diagnosis not present

## 2017-11-18 DIAGNOSIS — R05 Cough: Secondary | ICD-10-CM | POA: Diagnosis not present

## 2017-11-18 DIAGNOSIS — I10 Essential (primary) hypertension: Secondary | ICD-10-CM | POA: Diagnosis not present

## 2017-11-18 DIAGNOSIS — H6121 Impacted cerumen, right ear: Secondary | ICD-10-CM | POA: Diagnosis not present

## 2017-11-18 DIAGNOSIS — Z6825 Body mass index (BMI) 25.0-25.9, adult: Secondary | ICD-10-CM | POA: Diagnosis not present

## 2017-11-18 DIAGNOSIS — J309 Allergic rhinitis, unspecified: Secondary | ICD-10-CM | POA: Diagnosis not present

## 2017-12-13 DIAGNOSIS — Z23 Encounter for immunization: Secondary | ICD-10-CM | POA: Diagnosis not present

## 2018-01-07 DIAGNOSIS — Z6825 Body mass index (BMI) 25.0-25.9, adult: Secondary | ICD-10-CM | POA: Diagnosis not present

## 2018-01-07 DIAGNOSIS — J45909 Unspecified asthma, uncomplicated: Secondary | ICD-10-CM | POA: Diagnosis not present

## 2018-01-07 DIAGNOSIS — J309 Allergic rhinitis, unspecified: Secondary | ICD-10-CM | POA: Diagnosis not present

## 2018-01-07 DIAGNOSIS — R05 Cough: Secondary | ICD-10-CM | POA: Diagnosis not present

## 2018-01-07 DIAGNOSIS — I10 Essential (primary) hypertension: Secondary | ICD-10-CM | POA: Diagnosis not present

## 2018-01-15 ENCOUNTER — Ambulatory Visit
Admission: RE | Admit: 2018-01-15 | Discharge: 2018-01-15 | Disposition: A | Discharge status: Home / Self Care | Payer: Medicare Other | Source: Ambulatory Visit | Attending: Internal Medicine | Admitting: Internal Medicine

## 2018-01-15 ENCOUNTER — Other Ambulatory Visit: Payer: Self-pay | Admitting: Internal Medicine

## 2018-01-15 DIAGNOSIS — R519 Headache, unspecified: Secondary | ICD-10-CM

## 2018-01-15 DIAGNOSIS — E038 Other specified hypothyroidism: Secondary | ICD-10-CM | POA: Diagnosis not present

## 2018-01-15 DIAGNOSIS — Z1389 Encounter for screening for other disorder: Secondary | ICD-10-CM | POA: Diagnosis not present

## 2018-01-15 DIAGNOSIS — R51 Headache: Principal | ICD-10-CM

## 2018-01-15 DIAGNOSIS — I10 Essential (primary) hypertension: Secondary | ICD-10-CM | POA: Diagnosis not present

## 2018-01-15 DIAGNOSIS — Z6824 Body mass index (BMI) 24.0-24.9, adult: Secondary | ICD-10-CM | POA: Diagnosis not present

## 2018-01-15 DIAGNOSIS — M859 Disorder of bone density and structure, unspecified: Secondary | ICD-10-CM | POA: Diagnosis not present

## 2018-01-15 DIAGNOSIS — F418 Other specified anxiety disorders: Secondary | ICD-10-CM | POA: Diagnosis not present

## 2018-01-15 DIAGNOSIS — R413 Other amnesia: Secondary | ICD-10-CM | POA: Diagnosis not present

## 2018-01-15 DIAGNOSIS — S0993XA Unspecified injury of face, initial encounter: Secondary | ICD-10-CM | POA: Diagnosis not present

## 2018-01-15 DIAGNOSIS — R05 Cough: Secondary | ICD-10-CM | POA: Diagnosis not present

## 2018-01-15 DIAGNOSIS — R82998 Other abnormal findings in urine: Secondary | ICD-10-CM | POA: Diagnosis not present

## 2018-01-15 DIAGNOSIS — E7849 Other hyperlipidemia: Secondary | ICD-10-CM | POA: Diagnosis not present

## 2018-01-22 DIAGNOSIS — M859 Disorder of bone density and structure, unspecified: Secondary | ICD-10-CM | POA: Diagnosis not present

## 2018-01-22 DIAGNOSIS — J45998 Other asthma: Secondary | ICD-10-CM | POA: Diagnosis not present

## 2018-01-22 DIAGNOSIS — E7849 Other hyperlipidemia: Secondary | ICD-10-CM | POA: Diagnosis not present

## 2018-01-22 DIAGNOSIS — Z23 Encounter for immunization: Secondary | ICD-10-CM | POA: Diagnosis not present

## 2018-01-22 DIAGNOSIS — Z0000 Encounter for general adult medical examination without abnormal findings: Secondary | ICD-10-CM | POA: Diagnosis not present

## 2018-01-22 DIAGNOSIS — Z6825 Body mass index (BMI) 25.0-25.9, adult: Secondary | ICD-10-CM | POA: Diagnosis not present

## 2018-01-22 DIAGNOSIS — K5790 Diverticulosis of intestine, part unspecified, without perforation or abscess without bleeding: Secondary | ICD-10-CM | POA: Diagnosis not present

## 2018-01-22 DIAGNOSIS — E038 Other specified hypothyroidism: Secondary | ICD-10-CM | POA: Diagnosis not present

## 2018-01-22 DIAGNOSIS — R413 Other amnesia: Secondary | ICD-10-CM | POA: Diagnosis not present

## 2018-01-22 DIAGNOSIS — I10 Essential (primary) hypertension: Secondary | ICD-10-CM | POA: Diagnosis not present

## 2018-01-22 DIAGNOSIS — I739 Peripheral vascular disease, unspecified: Secondary | ICD-10-CM | POA: Diagnosis not present

## 2018-01-22 DIAGNOSIS — F418 Other specified anxiety disorders: Secondary | ICD-10-CM | POA: Diagnosis not present

## 2018-01-29 DIAGNOSIS — Z1212 Encounter for screening for malignant neoplasm of rectum: Secondary | ICD-10-CM | POA: Diagnosis not present

## 2018-02-18 ENCOUNTER — Other Ambulatory Visit: Payer: Self-pay | Admitting: Internal Medicine

## 2018-02-18 DIAGNOSIS — Z1231 Encounter for screening mammogram for malignant neoplasm of breast: Secondary | ICD-10-CM

## 2018-02-19 ENCOUNTER — Ambulatory Visit (HOSPITAL_COMMUNITY)
Admission: RE | Admit: 2018-02-19 | Discharge: 2018-02-19 | Disposition: A | Discharge status: Home / Self Care | Payer: Medicare Other | Source: Ambulatory Visit | Attending: Cardiology | Admitting: Cardiology

## 2018-02-19 DIAGNOSIS — Z9889 Other specified postprocedural states: Secondary | ICD-10-CM | POA: Diagnosis not present

## 2018-02-19 DIAGNOSIS — I6523 Occlusion and stenosis of bilateral carotid arteries: Secondary | ICD-10-CM

## 2018-02-20 DIAGNOSIS — M545 Low back pain: Secondary | ICD-10-CM | POA: Diagnosis not present

## 2018-03-03 ENCOUNTER — Encounter: Payer: Self-pay | Admitting: *Deleted

## 2018-03-18 DIAGNOSIS — F418 Other specified anxiety disorders: Secondary | ICD-10-CM | POA: Diagnosis not present

## 2018-03-31 ENCOUNTER — Ambulatory Visit: Payer: Medicare Other

## 2018-04-09 ENCOUNTER — Ambulatory Visit: Payer: Medicare Other

## 2018-04-29 ENCOUNTER — Ambulatory Visit: Payer: Medicare Other

## 2018-05-22 ENCOUNTER — Other Ambulatory Visit: Payer: Self-pay

## 2018-05-22 ENCOUNTER — Ambulatory Visit
Admission: RE | Admit: 2018-05-22 | Discharge: 2018-05-22 | Disposition: A | Discharge status: Home / Self Care | Payer: Medicare Other | Source: Ambulatory Visit | Attending: Internal Medicine | Admitting: Internal Medicine

## 2018-05-22 DIAGNOSIS — Z1231 Encounter for screening mammogram for malignant neoplasm of breast: Secondary | ICD-10-CM

## 2018-06-17 DIAGNOSIS — M1990 Unspecified osteoarthritis, unspecified site: Secondary | ICD-10-CM | POA: Diagnosis not present

## 2018-06-17 DIAGNOSIS — I10 Essential (primary) hypertension: Secondary | ICD-10-CM | POA: Diagnosis not present

## 2018-06-17 DIAGNOSIS — M79601 Pain in right arm: Secondary | ICD-10-CM | POA: Diagnosis not present

## 2018-06-17 DIAGNOSIS — R202 Paresthesia of skin: Secondary | ICD-10-CM | POA: Diagnosis not present

## 2018-07-21 DIAGNOSIS — M79601 Pain in right arm: Secondary | ICD-10-CM | POA: Diagnosis not present

## 2018-07-21 DIAGNOSIS — E039 Hypothyroidism, unspecified: Secondary | ICD-10-CM | POA: Diagnosis not present

## 2018-07-21 DIAGNOSIS — Z1331 Encounter for screening for depression: Secondary | ICD-10-CM | POA: Diagnosis not present

## 2018-07-21 DIAGNOSIS — F418 Other specified anxiety disorders: Secondary | ICD-10-CM | POA: Diagnosis not present

## 2018-07-21 DIAGNOSIS — R51 Headache: Secondary | ICD-10-CM | POA: Diagnosis not present

## 2018-07-21 DIAGNOSIS — I10 Essential (primary) hypertension: Secondary | ICD-10-CM | POA: Diagnosis not present

## 2018-07-21 DIAGNOSIS — R413 Other amnesia: Secondary | ICD-10-CM | POA: Diagnosis not present

## 2018-07-21 DIAGNOSIS — R202 Paresthesia of skin: Secondary | ICD-10-CM | POA: Diagnosis not present

## 2018-07-21 DIAGNOSIS — J309 Allergic rhinitis, unspecified: Secondary | ICD-10-CM | POA: Diagnosis not present

## 2018-09-04 DIAGNOSIS — N390 Urinary tract infection, site not specified: Secondary | ICD-10-CM | POA: Diagnosis not present

## 2018-11-12 DIAGNOSIS — R413 Other amnesia: Secondary | ICD-10-CM | POA: Diagnosis not present

## 2018-11-12 DIAGNOSIS — K625 Hemorrhage of anus and rectum: Secondary | ICD-10-CM | POA: Diagnosis not present

## 2018-11-12 DIAGNOSIS — I10 Essential (primary) hypertension: Secondary | ICD-10-CM | POA: Diagnosis not present

## 2018-11-12 DIAGNOSIS — W19XXXA Unspecified fall, initial encounter: Secondary | ICD-10-CM | POA: Diagnosis not present

## 2018-12-12 DIAGNOSIS — Z23 Encounter for immunization: Secondary | ICD-10-CM | POA: Diagnosis not present

## 2018-12-16 DIAGNOSIS — N950 Postmenopausal bleeding: Secondary | ICD-10-CM | POA: Diagnosis not present

## 2019-01-28 DIAGNOSIS — N8500 Endometrial hyperplasia, unspecified: Secondary | ICD-10-CM | POA: Diagnosis not present

## 2019-02-05 DIAGNOSIS — Z0000 Encounter for general adult medical examination without abnormal findings: Secondary | ICD-10-CM | POA: Diagnosis not present

## 2019-02-05 DIAGNOSIS — E038 Other specified hypothyroidism: Secondary | ICD-10-CM | POA: Diagnosis not present

## 2019-02-05 DIAGNOSIS — M859 Disorder of bone density and structure, unspecified: Secondary | ICD-10-CM | POA: Diagnosis not present

## 2019-02-05 DIAGNOSIS — I10 Essential (primary) hypertension: Secondary | ICD-10-CM | POA: Diagnosis not present

## 2019-02-05 DIAGNOSIS — E7849 Other hyperlipidemia: Secondary | ICD-10-CM | POA: Diagnosis not present

## 2019-02-05 DIAGNOSIS — M8580 Other specified disorders of bone density and structure, unspecified site: Secondary | ICD-10-CM | POA: Diagnosis not present

## 2019-02-05 DIAGNOSIS — E039 Hypothyroidism, unspecified: Secondary | ICD-10-CM | POA: Diagnosis not present

## 2019-02-08 DIAGNOSIS — R82998 Other abnormal findings in urine: Secondary | ICD-10-CM | POA: Diagnosis not present

## 2019-02-11 DIAGNOSIS — I499 Cardiac arrhythmia, unspecified: Secondary | ICD-10-CM | POA: Diagnosis not present

## 2019-02-11 DIAGNOSIS — M8580 Other specified disorders of bone density and structure, unspecified site: Secondary | ICD-10-CM | POA: Diagnosis not present

## 2019-02-11 DIAGNOSIS — M1990 Unspecified osteoarthritis, unspecified site: Secondary | ICD-10-CM | POA: Diagnosis not present

## 2019-02-11 DIAGNOSIS — E039 Hypothyroidism, unspecified: Secondary | ICD-10-CM | POA: Diagnosis not present

## 2019-02-11 DIAGNOSIS — Z0000 Encounter for general adult medical examination without abnormal findings: Secondary | ICD-10-CM | POA: Diagnosis not present

## 2019-02-11 DIAGNOSIS — I10 Essential (primary) hypertension: Secondary | ICD-10-CM | POA: Diagnosis not present

## 2019-02-11 DIAGNOSIS — R413 Other amnesia: Secondary | ICD-10-CM | POA: Diagnosis not present

## 2019-02-11 DIAGNOSIS — K625 Hemorrhage of anus and rectum: Secondary | ICD-10-CM | POA: Diagnosis not present

## 2019-02-11 DIAGNOSIS — K5790 Diverticulosis of intestine, part unspecified, without perforation or abscess without bleeding: Secondary | ICD-10-CM | POA: Diagnosis not present

## 2019-02-11 DIAGNOSIS — I739 Peripheral vascular disease, unspecified: Secondary | ICD-10-CM | POA: Diagnosis not present

## 2019-02-11 DIAGNOSIS — F418 Other specified anxiety disorders: Secondary | ICD-10-CM | POA: Diagnosis not present

## 2019-02-11 DIAGNOSIS — E785 Hyperlipidemia, unspecified: Secondary | ICD-10-CM | POA: Diagnosis not present

## 2019-05-05 DIAGNOSIS — Z23 Encounter for immunization: Secondary | ICD-10-CM | POA: Diagnosis not present

## 2019-06-02 DIAGNOSIS — Z23 Encounter for immunization: Secondary | ICD-10-CM | POA: Diagnosis not present

## 2019-06-24 ENCOUNTER — Other Ambulatory Visit: Payer: Self-pay

## 2019-06-24 ENCOUNTER — Encounter: Payer: Self-pay | Admitting: Cardiovascular Disease

## 2019-06-24 ENCOUNTER — Ambulatory Visit (INDEPENDENT_AMBULATORY_CARE_PROVIDER_SITE_OTHER): Payer: Medicare Other | Admitting: Cardiovascular Disease

## 2019-06-24 VITALS — BP 140/71 | HR 72 | Temp 96.9°F | Ht 67.0 in | Wt 153.8 lb

## 2019-06-24 DIAGNOSIS — I6523 Occlusion and stenosis of bilateral carotid arteries: Secondary | ICD-10-CM

## 2019-06-24 DIAGNOSIS — Z9889 Other specified postprocedural states: Secondary | ICD-10-CM | POA: Diagnosis not present

## 2019-06-24 DIAGNOSIS — I700 Atherosclerosis of aorta: Secondary | ICD-10-CM | POA: Diagnosis not present

## 2019-06-24 DIAGNOSIS — I351 Nonrheumatic aortic (valve) insufficiency: Secondary | ICD-10-CM

## 2019-06-24 NOTE — Patient Instructions (Signed)
Medication Instructions:  CONTINUE WITH CURRENT MEDICATIONS. NO CHANGES.  *If you need a refill on your cardiac medications before your next appointment, please call your pharmacy*    Testing/Procedures: Your physician has requested that you have an echocardiogram. Echocardiography is a painless test that uses sound waves to create images of your heart. It provides your doctor with information about the size and shape of your heart and how well your heart's chambers and valves are working. This procedure takes approximately one hour. There are no restrictions for this procedure.  [place]   Follow-Up: At [facility], you and your health needs are our priority.  As part of our continuing mission to provide you with exceptional heart care, we have created designated Provider Care Teams.  These Care Teams include your primary Cardiologist (physician) and Advanced Practice Providers (APPs -  Physician Assistants and Nurse Practitioners) who all work together to provide you with the care you need, when you need it.  We recommend signing up for the patient portal called "MyChart".  Sign up information is provided on this After Visit Summary.  MyChart is used to connect with patients for Virtual Visits (Telemedicine).  Patients are able to view lab/test results, encounter notes, upcoming appointments, etc.  Non-urgent messages can be sent to your provider as well.   To learn more about what you can do with MyChart, go to [email or web address].    Your next appointment:   12 month(s)  The format for your next appointment:   In Person  Provider:   Shelva Majestic, MD

## 2019-06-24 NOTE — Progress Notes (Signed)
Patient ID: Danielle [name], female   DOB: [birth year], 86 y.o.   MRN: [id number]     HPI: Danielle [name] is a 86 y.o. female who presents to the office today for a 30 month cardiology evaluation. I last saw her in October 2018.  Danielle [name] has a history of peripheral vascular disease and is status post right carotid endarterectomy in 2005. She also has a history of hypothyroidism, hypertension, palpitations, hyperlipidemia and has intermittent seasonal allergies. In January 2011 Echo Doppler study showed normal systolic function with grade 1 diastolic dysfunction, mild MR and TR and mild aortic sclerosis. A nuclear perfusion study at that time revealed normal perfusion.  A carotid duplex study was in January 2012 was was mildly abnormal with irregular mixed density plaque but was without significant additional stenosis.  She sees Dr Dagmar Hait, for primary care. Laboratory in September 2013 showed a cholesterol of 136 triglyceride 64 HDL 50 LDL 73. Normal renal function. She was not anemic.  Danielle [name] underwent  bilateral cataract surgery last summer by Dr. Prudencio Burly at [facility]. She tolerated surgery well from a cardiovascular standpoint. She denies recent leg swelling although she does have a prescription for Lasix. She is tolerating her Bystolic and denies any recent palpitations.   Her last echo Doppler study was in October 2014 and this revealed normal systolic function with an ejection fraction of 60-65%.  She did not have regional wall motion abnormalities.  There is grade 1 diastolic dysfunction.  There was evidence for aortic valve sclerosis without stenosis, mild to moderate mitral regurgitation, and mild tricuspid regurgitation.  She had mild pulmonary hypertension with an estimated RV systolic pressure at 33 mm.  Prior to planned left shoulder replacement in May 2015, she underwent a preoperative nuclear perfusion study  in April 2015.  This was low risk and showed some basal  septal, artifact without scar or ischemia.  Danielle [name] underwent left shoulder surgery and on 07/20/2013 and on 11/16/2013 underwent right shoulder replacement surgery by Dr. [name].  She did have some confusion, which may be medication related.  Following her second surgery.  She tolerated both of these were cardiovascular standpoint.  When I last saw her in 2018 after not having seen her in over 2 years she was without episodes of chest pain.  She had seen Dr. Dagmar Hait.  Laboratory was performed.  She was found to have an over suppressed thyroid with a TSH of 0.09 and her Synthroid dose was reduced from 137 g to 125 g.  In addition, her LDL was increased at 121 and apparently she was just started on a prescription of very low-dose rosuvastatin at 5 mg.  She tells me that last week she tripped and fell backwards and hit her head.  She underwent an CT evaluation on 12/15/2016, which did not show any acute traumatic finding.  There was ordinary.  Generalized atrophy with mild chronic small vessel changes of the white matter.  She is unaware of palpitations.   I recommended that he undergo a follow-up echo Doppler study which was done on January 19, 2017. This revealed normal LV function with EF 55 to 60%. There was abnormal tissue Doppler. There was evidence for aortic sclerosis without stenosis with mild aortic insufficiency.  She underwent follow-up carotid studies in December 2019 which showed mild 1 to 39% stenoses in the right and left ICA. There was nonsignificant plaque in the common carotid artery on the left. She had normal flow dynamics in  the vertebral and subclavian vessels. She denies any episodes of chest pain. She continues to see Dr. Dagmar Hait who was checked laboratory. She states that she is now on rosuvastatin but she did not know the dose. She has continued to be on nebivolol 5 mg daily for blood pressure and palpitations. She is on levothyroxine 125 mcg for hypothyroidism. She presents for  evaluation.  Past Medical History:  Diagnosis Date  . Anxiety   . Arthritis    Right shoulder arthritis  . Asthma   . Carotid artery disease ([place])   . Diverticulitis   . Dysrhythmia   . Headache(784.0)   . Heart murmur   . Hypothyroidism   . Memory impairment   . Osteoarthritis of left shoulder 07/20/2013  . Peripheral vascular disease ([place])    carotid   . Primary osteoarthritis of right shoulder 11/16/2013  . Seasonal allergies     Past surgical history is notable for right carotid endarterectomy and recent bilateral cataract surgeries.  No Known Allergies  Current Outpatient Medications  Medication Sig Dispense Refill  . alendronate (FOSAMAX) 70 MG tablet Take 70 mg by mouth every Monday.     Marland Kitchen aspirin EC 81 MG tablet Take 81 mg by mouth daily.    . calcium carbonate (OS-CAL) 600 MG TABS tablet Take 600 mg by mouth daily with breakfast.     . cetirizine (ZYRTEC) 10 MG tablet Take 10 mg by mouth daily.    . cholecalciferol (VITAMIN D) 1000 UNITS tablet Take 2,000 Units by mouth daily.     . fluticasone (FLONASE) 50 MCG/ACT nasal spray Place 2 sprays into both nostrils daily.     Marland Kitchen guaiFENesin (MUCINEX) 600 MG 12 hr tablet Take 1,200 mg by mouth 2 (two) times daily as needed for to loosen phlegm.     Marland Kitchen ibuprofen (ADVIL,MOTRIN) 200 MG tablet Take 200 mg by mouth every 6 (six) hours as needed for headache (pain).    Marland Kitchen levothyroxine (SYNTHROID, LEVOTHROID) 125 MCG tablet Take 125 mcg by mouth daily before breakfast.    . MEGARED OMEGA-3 KRILL OIL PO Take 1 capsule by mouth daily.    . Multiple Vitamin (MULTIVITAMIN WITH MINERALS) TABS tablet Take 1 tablet by mouth daily.    . nebivolol (BYSTOLIC) 5 MG tablet Take 1 tablet (5 mg total) by mouth daily. 90 tablet 3  . OVER THE COUNTER MEDICATION Place 1 drop into both eyes daily as needed (dry eyes). Over the counter lubricating eye drop    . OVER THE COUNTER MEDICATION Take 1 tablet by mouth every 4 (four) hours as needed (nasal  drainage/ allergies). OTC allergy medication     No current facility-administered medications for this visit.    Social History   Socioeconomic History  . Marital status: Married    Spouse name: Not on file  . Number of children: Not on file  . Years of education: Not on file  . Highest education level: Not on file  Occupational History  . Not on file  Tobacco Use  . Smoking status: Former Smoker    Packs/day: 0.25    Years: 2.00    Pack years: 0.50    Types: Cigarettes    Quit date: 07/13/1963    Years since quitting: 55.9  . Smokeless tobacco: Never Used  . Tobacco comment: quit aprrox 50 years ago.  Substance and Sexual Activity  . Alcohol use: No  . Drug use: No  . Sexual activity: Not on file  Other  Topics Concern  . Not on file  Social History Narrative  . Not on file   Social Determinants of Health   Financial Resource Strain:   . Difficulty of Paying Living Expenses:   Food Insecurity:   . Worried About Charity fundraiser in the Last Year:   . Arboriculturist in the Last Year:   Transportation Needs:   . Film/video editor (Medical):   Marland Kitchen Lack of Transportation (Non-Medical):   Physical Activity:   . Days of Exercise per Week:   . Minutes of Exercise per Session:   Stress:   . Feeling of Stress :   Social Connections:   . Frequency of Communication with Friends and Family:   . Frequency of Social Gatherings with Friends and Family:   . Attends Religious Services:   . Active Member of Clubs or Organizations:   . Attends Archivist Meetings:   Marland Kitchen Marital Status:   Intimate Partner Violence:   . Fear of Current or Ex-Partner:   . Emotionally Abused:   Marland Kitchen Physically Abused:   . Sexually Abused:     Family history is notable that both parents are deceased mother at age 63 with cancer father at age 84. Her brother is deceased also and had various cancers. His sister is also deceased. She is the mother of my patient Danielle [name].  ROS General: Negative; No fevers, chills, or night sweats;  HEENT: Vision has significantly improved following cataract surgery No changes in vision or hearing, sinus congestion, difficulty swallowing Pulmonary: Negative; No cough, wheezing, shortness of breath, hemoptysis Cardiovascular: Negative; No chest pain, presyncope, syncope, palpitations GI: Negative; No nausea, vomiting, diarrhea, or abdominal pain GU: Negative; No dysuria, hematuria, or difficulty voiding Musculoskeletal: Positive for osteopenia versus osteoporosis.  She is status post bilateral sequential shoulder replacement surgeries;  Hematologic/Oncology: Negative; no easy bruising, bleeding Endocrine: Positive for hypothyroidism Neuro: Negative; no changes in balance, headaches Skin: Negative; No rashes or skin lesions Psychiatric: Negative; No behavioral problems, depression Sleep: Negative; No snoring, daytime sleepiness, hypersomnolence, bruxism, restless legs, hypnogognic hallucinations, no cataplexy Other comprehensive 14 point system review is negative.   PE BP 140/71   Pulse 72   Temp (!) 96.9 F (36.1 C)   Ht 5' 7"  (1.702 m)   Wt 153 lb 12.8 oz (69.8 kg)   SpO2 98%   BMI 24.09 kg/m    Repeat blood pressure by me 124/70  Wt Readings from Last 3 Encounters:  06/24/19 153 lb 12.8 oz (69.8 kg)  12/25/16 159 lb 14.4 oz (72.5 kg)  12/15/16 155 lb (70.3 kg)   General: Alert, oriented, no distress.  Skin: normal turgor, no rashes, warm and dry HEENT: Normocephalic, atraumatic. Pupils equal round and reactive to light; sclera anicteric; extraocular muscles intact;  Nose without nasal septal hypertrophy Mouth/Parynx benign; Mallinpatti scale 3 Neck: No JVD, no carotid bruits; normal carotid upstroke Lungs: clear to ausculatation and percussion; no wheezing or rales Chest wall: without tenderness to palpitation Heart: PMI not displaced, RRR, s1 s2 normal, 2/6 systolic murmur in the aortic area no  diastolic murmur, no rubs, gallops, thrills, or heaves Abdomen: soft, nontender; no hepatosplenomehaly, BS+; abdominal aorta nontender and not dilated by palpation. Back: no CVA tenderness Pulses 2+ Musculoskeletal: full range of motion, normal strength, no joint deformities Extremities: no clubbing cyanosis or edema, Homan's sign negative  Neurologic: grossly nonfocal; Cranial nerves grossly wnl Psychologic: Normal mood and affect   ECG (independently read  by me): Normal sinus rhythm at 72 bpm. First-degree AV block with a PR normal at 218 ms.  October 2018 ECG (independently read by me): Sinus bradycardia at 57 bpm.  First-degree AV block with a PR interval of 222 ms.  No significant ST-T changes.  September 2016 ECG (independently read by me):  Normal sinus rhythm at 67 bpm.  No ectopy.    Ffirst-degree AV block  PR interval at 206 ms.  ECG (independently read by me): Sinus rhythm with first-degree AV block.  Prior March 2015 ECG (independently read by me): Normal sinus rhythm with PACs and a transient trigeminal rhythm.  ECG: Sinus rhythm 84 beats per minute period. We'll 168 ms. QTc interval 470 ms. No significant ST-T changes.  LABS: BMP Latest Ref Rng & Units 02/01/2016 11/18/2013 11/17/2013  Glucose 65 - 99 mg/dL 105(H) 110(H) 99  BUN 6 - 20 mg/dL 12 12 10   Creatinine 0.44 - 1.00 mg/dL 1.06(H) 0.81 0.73  Sodium 135 - 145 mmol/L 137 136(L) 134(L)  Potassium 3.5 - 5.1 mmol/L 4.0 3.9 4.5  Chloride 101 - 111 mmol/L 106 100 99  CO2 22 - 32 mmol/L 22 24 20   Calcium 8.9 - 10.3 mg/dL 10.0 8.9 9.0   Hepatic Function Latest Ref Rng & Units 02/01/2016 11/18/2013 12/11/2008  Total Protein 6.5 - 8.1 g/dL 7.2 6.8 7.4  Albumin 3.5 - 5.0 g/dL 4.3 3.3(L) 4.1  AST 15 - 41 U/L 42(H) 29 34  ALT 14 - 54 U/L 27 15 27   Alk Phosphatase 38 - 126 U/L 81 81 83  Total Bilirubin 0.3 - 1.2 mg/dL 1.4(H) 1.6(H) 1.1   CBC Latest Ref Rng & Units 02/01/2016 11/18/2013 11/17/2013  WBC 4.0 - 10.5 K/uL 7.9 8.8  10.7(H)  Hemoglobin 12.0 - 15.0 g/dL 15.3(H) 13.1 14.0  Hematocrit 36.0 - 46.0 % 44.0 38.7 42.4  Platelets 150 - 400 K/uL 192 172 198   Lab Results  Component Value Date   MCV 88.5 02/01/2016   MCV 88.0 11/18/2013   MCV 91.4 11/17/2013   Lipid Panel  No results found for: CHOL, TRIG, HDL, CHOLHDL, VLDL, LDLCALC, LDLDIRECT   RADIOLOGY: No results found.  IMPRESSION:  No diagnosis found.  ASSESSMENT AND PLAN: Danielle [name] is a 86 year old female has a history of  hypertension, hyperlipidemia, tachypalpitations, and peripheral vascular disease. She is status post right carotid carotid endarterectomy in 2003. An echo Doppler study in 2014  showed normal systolic function. She did have aortic valve sclerosis without stenosis with trace aortic insufficiency. She also had mitral annular calcification with mild to moderate central regurgitation. There was grade 1 diastolic dysfunction.  She has been on Bystolic. Her last echo in 2018 continue to show aortic sclerosis with mild LVH and abnormal Doppler parameters consistent with elevated ventricular end-diastolic filling pressure. There was aortic sclerosis with mild aortic insufficiency. She has continued to do well and she denies any significant palpitations on her current dose of Bystolic 5 mg daily. Remotely she had been on a higher dose which was reduced secondary to bradycardia and first-degree heart block. She continues to be on levothyroxine 125 mcg for hypothyroidism. She is now on rosuvastatin and the most potent anemia. I will try to obtain laboratory from Dr. Danna Hefty office. Cardiac monitor today appears more prominent. I have suggested a follow-up echo Doppler assessment. Dr. Elsworth Soho will be rechecking laboratory. I will contact her regarding her echo results. As long as she remains stable I will see her in 1  year for reevaluation.   [name], MD, [facility]  06/24/2019 2:45 PM

## 2019-06-25 ENCOUNTER — Other Ambulatory Visit: Payer: Self-pay | Admitting: Internal Medicine

## 2019-06-25 DIAGNOSIS — Z1231 Encounter for screening mammogram for malignant neoplasm of breast: Secondary | ICD-10-CM

## 2019-06-26 ENCOUNTER — Encounter: Payer: Self-pay | Admitting: Cardiovascular Disease

## 2019-07-12 ENCOUNTER — Other Ambulatory Visit: Payer: Self-pay

## 2019-07-12 ENCOUNTER — Ambulatory Visit (HOSPITAL_COMMUNITY): Payer: Medicare Other | Attending: Cardiology

## 2019-07-12 DIAGNOSIS — I119 Hypertensive heart disease without heart failure: Secondary | ICD-10-CM | POA: Insufficient documentation

## 2019-07-12 DIAGNOSIS — R011 Cardiac murmur, unspecified: Secondary | ICD-10-CM | POA: Insufficient documentation

## 2019-07-12 DIAGNOSIS — Z9889 Other specified postprocedural states: Secondary | ICD-10-CM | POA: Diagnosis not present

## 2019-07-12 DIAGNOSIS — I6523 Occlusion and stenosis of bilateral carotid arteries: Secondary | ICD-10-CM | POA: Insufficient documentation

## 2019-07-12 DIAGNOSIS — I083 Combined rheumatic disorders of mitral, aortic and tricuspid valves: Secondary | ICD-10-CM | POA: Insufficient documentation

## 2019-07-15 ENCOUNTER — Other Ambulatory Visit: Payer: Self-pay

## 2019-07-15 ENCOUNTER — Ambulatory Visit
Admission: RE | Admit: 2019-07-15 | Discharge: 2019-07-15 | Disposition: A | Discharge status: Home / Self Care | Payer: Medicare Other | Source: Ambulatory Visit | Attending: Internal Medicine | Admitting: Internal Medicine

## 2019-07-15 DIAGNOSIS — Z1231 Encounter for screening mammogram for malignant neoplasm of breast: Secondary | ICD-10-CM | POA: Diagnosis not present

## 2019-08-16 DIAGNOSIS — I499 Cardiac arrhythmia, unspecified: Secondary | ICD-10-CM | POA: Diagnosis not present

## 2019-08-16 DIAGNOSIS — F418 Other specified anxiety disorders: Secondary | ICD-10-CM | POA: Diagnosis not present

## 2019-08-16 DIAGNOSIS — J309 Allergic rhinitis, unspecified: Secondary | ICD-10-CM | POA: Diagnosis not present

## 2019-08-16 DIAGNOSIS — R109 Unspecified abdominal pain: Secondary | ICD-10-CM | POA: Diagnosis not present

## 2019-08-16 DIAGNOSIS — E7849 Other hyperlipidemia: Secondary | ICD-10-CM | POA: Diagnosis not present

## 2019-08-16 DIAGNOSIS — I739 Peripheral vascular disease, unspecified: Secondary | ICD-10-CM | POA: Diagnosis not present

## 2019-08-16 DIAGNOSIS — I10 Essential (primary) hypertension: Secondary | ICD-10-CM | POA: Diagnosis not present

## 2019-08-16 DIAGNOSIS — R413 Other amnesia: Secondary | ICD-10-CM | POA: Diagnosis not present

## 2019-08-16 DIAGNOSIS — E038 Other specified hypothyroidism: Secondary | ICD-10-CM | POA: Diagnosis not present

## 2019-08-23 ENCOUNTER — Other Ambulatory Visit: Payer: Self-pay

## 2019-08-23 ENCOUNTER — Other Ambulatory Visit: Payer: Self-pay | Admitting: Internal Medicine

## 2019-08-23 DIAGNOSIS — R519 Headache, unspecified: Secondary | ICD-10-CM

## 2019-09-27 DIAGNOSIS — H532 Diplopia: Secondary | ICD-10-CM | POA: Diagnosis not present

## 2019-09-27 DIAGNOSIS — H5032 Intermittent alternating esotropia: Secondary | ICD-10-CM | POA: Diagnosis not present

## 2019-09-27 DIAGNOSIS — Z961 Presence of intraocular lens: Secondary | ICD-10-CM | POA: Diagnosis not present

## 2019-09-27 DIAGNOSIS — R519 Headache, unspecified: Secondary | ICD-10-CM | POA: Diagnosis not present

## 2019-10-09 ENCOUNTER — Encounter (HOSPITAL_BASED_OUTPATIENT_CLINIC_OR_DEPARTMENT_OTHER): Payer: Self-pay

## 2019-10-09 ENCOUNTER — Emergency Department (HOSPITAL_BASED_OUTPATIENT_CLINIC_OR_DEPARTMENT_OTHER)
Admission: EM | Admit: 2019-10-09 | Discharge: 2019-10-10 | Disposition: A | Discharge status: Home / Self Care | Payer: Medicare Other | Attending: Emergency Medicine | Admitting: Emergency Medicine

## 2019-10-09 ENCOUNTER — Other Ambulatory Visit: Payer: Self-pay

## 2019-10-09 ENCOUNTER — Emergency Department (HOSPITAL_BASED_OUTPATIENT_CLINIC_OR_DEPARTMENT_OTHER): Payer: Medicare Other

## 2019-10-09 DIAGNOSIS — I10 Essential (primary) hypertension: Secondary | ICD-10-CM | POA: Insufficient documentation

## 2019-10-09 DIAGNOSIS — Z87891 Personal history of nicotine dependence: Secondary | ICD-10-CM | POA: Insufficient documentation

## 2019-10-09 DIAGNOSIS — H81399 Other peripheral vertigo, unspecified ear: Secondary | ICD-10-CM | POA: Diagnosis not present

## 2019-10-09 DIAGNOSIS — R5381 Other malaise: Secondary | ICD-10-CM | POA: Diagnosis not present

## 2019-10-09 DIAGNOSIS — E039 Hypothyroidism, unspecified: Secondary | ICD-10-CM | POA: Insufficient documentation

## 2019-10-09 DIAGNOSIS — R29818 Other symptoms and signs involving the nervous system: Secondary | ICD-10-CM | POA: Diagnosis not present

## 2019-10-09 DIAGNOSIS — Z7982 Long term (current) use of aspirin: Secondary | ICD-10-CM | POA: Diagnosis not present

## 2019-10-09 DIAGNOSIS — G319 Degenerative disease of nervous system, unspecified: Secondary | ICD-10-CM | POA: Diagnosis not present

## 2019-10-09 DIAGNOSIS — E785 Hyperlipidemia, unspecified: Secondary | ICD-10-CM | POA: Diagnosis not present

## 2019-10-09 DIAGNOSIS — I6782 Cerebral ischemia: Secondary | ICD-10-CM | POA: Insufficient documentation

## 2019-10-09 DIAGNOSIS — Z79899 Other long term (current) drug therapy: Secondary | ICD-10-CM | POA: Insufficient documentation

## 2019-10-09 DIAGNOSIS — J45909 Unspecified asthma, uncomplicated: Secondary | ICD-10-CM | POA: Diagnosis not present

## 2019-10-09 DIAGNOSIS — I739 Peripheral vascular disease, unspecified: Secondary | ICD-10-CM | POA: Insufficient documentation

## 2019-10-09 DIAGNOSIS — Z79890 Hormone replacement therapy (postmenopausal): Secondary | ICD-10-CM | POA: Insufficient documentation

## 2019-10-09 DIAGNOSIS — R519 Headache, unspecified: Secondary | ICD-10-CM

## 2019-10-09 DIAGNOSIS — I2510 Atherosclerotic heart disease of native coronary artery without angina pectoris: Secondary | ICD-10-CM | POA: Diagnosis not present

## 2019-10-09 DIAGNOSIS — R42 Dizziness and giddiness: Secondary | ICD-10-CM | POA: Diagnosis not present

## 2019-10-09 LAB — COMPREHENSIVE METABOLIC PANEL
ALT: 26 U/L (ref 0–44)
AST: 38 U/L (ref 15–41)
Albumin: 4.3 g/dL (ref 3.5–5.0)
Alkaline Phosphatase: 102 U/L (ref 38–126)
Anion gap: 13 (ref 5–15)
BUN: 15 mg/dL (ref 8–23)
CO2: 21 mmol/L — ABNORMAL LOW (ref 22–32)
Calcium: 9.6 mg/dL (ref 8.9–10.3)
Chloride: 104 mmol/L (ref 98–111)
Creatinine, Ser: 0.93 mg/dL (ref 0.44–1.00)
GFR calc Af Amer: >60 mL/min (ref >60)
GFR calc non Af Amer: 56 mL/min — ABNORMAL LOW (ref >60)
Glucose, Bld: 109 mg/dL — ABNORMAL HIGH (ref 70–99)
Potassium: 4.1 mmol/L (ref 3.5–5.1)
Sodium: 138 mmol/L (ref 135–145)
Total Bilirubin: 1.1 mg/dL (ref 0.3–1.2)
Total Protein: 8.3 g/dL — ABNORMAL HIGH (ref 6.5–8.1)

## 2019-10-09 LAB — DIFFERENTIAL
Abs Immature Granulocytes: 0.03 10*3/uL (ref 0.00–0.07)
Basophils Absolute: 0.1 10*3/uL (ref 0.0–0.1)
Basophils Relative: 1 %
Eosinophils Absolute: 0.2 10*3/uL (ref 0.0–0.5)
Eosinophils Relative: 2 %
Immature Granulocytes: 0 %
Lymphocytes Relative: 30 %
Lymphs Abs: 2.8 10*3/uL (ref 0.7–4.0)
Monocytes Absolute: 1.1 10*3/uL — ABNORMAL HIGH (ref 0.1–1.0)
Monocytes Relative: 12 %
Neutro Abs: 5.2 10*3/uL (ref 1.7–7.7)
Neutrophils Relative %: 55 %

## 2019-10-09 LAB — APTT: aPTT: 31 seconds (ref 24–36)

## 2019-10-09 LAB — CBC
HCT: 48 % — ABNORMAL HIGH (ref 36.0–46.0)
Hemoglobin: 16.3 g/dL — ABNORMAL HIGH (ref 12.0–15.0)
MCH: 30.9 pg (ref 26.0–34.0)
MCHC: 34 g/dL (ref 30.0–36.0)
MCV: 90.9 fL (ref 80.0–100.0)
Platelets: 184 10*3/uL (ref 150–400)
RBC: 5.28 MIL/uL — ABNORMAL HIGH (ref 3.87–5.11)
RDW: 13.1 % (ref 11.5–15.5)
WBC: 9.5 10*3/uL (ref 4.0–10.5)
nRBC: 0 % (ref 0.0–0.2)

## 2019-10-09 LAB — PROTIME-INR
INR: 0.9 (ref 0.8–1.2)
Prothrombin Time: 12 seconds (ref 11.4–15.2)

## 2019-10-09 MED ORDER — SODIUM CHLORIDE 0.9% FLUSH
3.0000 mL | Freq: Once | INTRAVENOUS | Status: DC
Start: 2019-10-09 — End: 2019-10-10
  Filled 2019-10-09: qty 3

## 2019-10-09 NOTE — ED Triage Notes (Signed)
Per daughter pt having headaches, dizziness x 2 weeks. Pt's family members stated that the pt started shaking all over briefly. Pt stating she has pain running through her body.

## 2019-10-10 DIAGNOSIS — R519 Headache, unspecified: Secondary | ICD-10-CM | POA: Diagnosis not present

## 2019-10-10 LAB — URINALYSIS, ROUTINE W REFLEX MICROSCOPIC
Bilirubin Urine: NEGATIVE
Glucose, UA: NEGATIVE mg/dL
Hgb urine dipstick: NEGATIVE
Ketones, ur: NEGATIVE mg/dL
Nitrite: NEGATIVE
Protein, ur: NEGATIVE mg/dL
Specific Gravity, Urine: 1.025 (ref 1.005–1.030)
pH: 5.5 (ref 5.0–8.0)

## 2019-10-10 LAB — URINALYSIS, MICROSCOPIC (REFLEX): RBC / HPF: NONE SEEN RBC/hpf (ref 0–5)

## 2019-10-10 MED ORDER — DIPHENHYDRAMINE HCL 50 MG/ML IJ SOLN
25.0000 mg | Freq: Once | INTRAMUSCULAR | Status: AC
  Administered 2019-10-10: 25 mg via INTRAVENOUS
  Filled 2019-10-10: qty 1

## 2019-10-10 MED ORDER — SODIUM CHLORIDE 0.9 % IV BOLUS
1000.0000 mL | Freq: Once | INTRAVENOUS | Status: AC
  Administered 2019-10-10: 1000 mL via INTRAVENOUS

## 2019-10-10 MED ORDER — MECLIZINE HCL 25 MG PO TABS
25.0000 mg | ORAL_TABLET | Freq: Three times a day (TID) | ORAL | 0 refills | Status: DC | PRN
Start: 2019-10-10 — End: 2020-07-13

## 2019-10-10 MED ORDER — PROCHLORPERAZINE EDISYLATE 10 MG/2ML IJ SOLN
10.0000 mg | Freq: Once | INTRAMUSCULAR | Status: AC
  Administered 2019-10-10: 10 mg via INTRAVENOUS
  Filled 2019-10-10: qty 2

## 2019-10-10 MED ORDER — DEXAMETHASONE SODIUM PHOSPHATE 10 MG/ML IJ SOLN
10.0000 mg | Freq: Once | INTRAMUSCULAR | Status: AC
  Administered 2019-10-10: 10 mg via INTRAVENOUS
  Filled 2019-10-10: qty 1

## 2019-10-10 MED ORDER — MECLIZINE HCL 25 MG PO TABS
25.0000 mg | ORAL_TABLET | Freq: Three times a day (TID) | ORAL | 0 refills | Status: DC | PRN
Start: 2019-10-10 — End: 2019-10-10

## 2019-10-10 NOTE — ED Notes (Signed)
ED Provider at bedside. 

## 2019-10-10 NOTE — ED Notes (Signed)
PT denies headache or dizziness. Pt states took a "xanax" just before coming to ED at 1900 and feels much better. Daughter at bedside.

## 2019-10-10 NOTE — ED Provider Notes (Signed)
[facility] EMERGENCY DEPARTMENT Provider Note   CSN: [id number] Arrival date & time: 10/09/19  1818   History Chief Complaint  Patient presents with  . Headache    Danielle [name] is a 86 y.o. female.  The history is provided by the patient.  Headache She has history of hypertension, hyperlipidemia, peripheral vascular disease and comes in for not feeling well and headache.  She states that she generally has not felt well for the last week.  Today, she had a strange sensation like there was water going through her veins.  She developed a generalized headache today which seemed to move from location to location.  She also noted that she felt swimmy headed which got exacerbated by any movement.  She denies fever, chills, sweats.  She denies rhinorrhea or nasal congestion.  She has a chronic sore throat which has not changed.  She denies any cough.  She denies nausea, vomiting, diarrhea.  She denies arthralgias or myalgias.  She denies urinary urgency, frequency, tenesmus, dysuria.  She denies any sick contacts.  She has had problems with anxiety and her husband to give her an anxiety pill today which did seem to give some relief.  Headache currently is not severe but is still present.  She is not feeling swimmy headed currently.  Past Medical History:  Diagnosis Date  . Anxiety   . Arthritis    Right shoulder arthritis  . Asthma   . Carotid artery disease ([place])   . Diverticulitis   . Dysrhythmia   . Headache(784.0)   . Heart murmur   . Hypothyroidism   . Memory impairment   . Osteoarthritis of left shoulder 07/20/2013  . Peripheral vascular disease ([place])    carotid   . Primary osteoarthritis of right shoulder 11/16/2013  . Seasonal allergies     Patient Active Problem List   Diagnosis Date Noted  . Acute encephalopathy 11/18/2013  . Primary osteoarthritis of right shoulder 11/16/2013  . Osteoarthritis of left shoulder 07/20/2013  . Primary localized osteoarthrosis,  shoulder region 07/20/2013  . Palpitations 06/06/2013  . Aortic valve sclerosis 06/06/2013  . Mitral annular calcification 06/06/2013  . Carotid stenosis 10/26/2012  . Hyperlipidemia 10/26/2012  . Hypothyroidism 10/26/2012  . HTN (hypertension) 10/26/2012  . S/P cataract surgery 10/26/2012    Past Surgical History:  Procedure Laterality Date  . BREAST EXCISIONAL BIOPSY Left   . CAROTID ENDARTERECTOMY Right   . CATARACT EXTRACTION W/ INTRAOCULAR LENS  IMPLANT, BILATERAL    . COLON SURGERY    . COLON SURGERY  2005   had a colostomy for 6 months   . COLONOSCOPY    . EYE SURGERY Bilateral    cataracts   . GASTRIC RESTRICTION SURGERY Left 2006  . TONSILLECTOMY    . TOTAL SHOULDER ARTHROPLASTY Left 07/20/2013   Procedure: TOTAL SHOULDER ARTHROPLASTY;  Surgeon: [name], MD;  Location: [facility];  Service: Orthopedics;  Laterality: Left;  . TOTAL SHOULDER ARTHROPLASTY Right 11/16/2013   Dr [name]  . TOTAL SHOULDER ARTHROPLASTY Right 11/16/2013   Procedure: RIGHT TOTAL SHOULDER ARTHROPLASTY;  Surgeon: [name], MD;  Location: [facility];  Service: Orthopedics;  Laterality: Right;     OB History   No obstetric history on file.     Family History  Problem Relation Age of Onset  . Cancer - Lung Mother   . Cancer - Other Other     Social History   Tobacco Use  . Smoking status: Former  Smoker    Packs/day: 0.25    Years: 2.00    Pack years: 0.50    Types: Cigarettes    Quit date: 07/13/1963    Years since quitting: 56.2  . Smokeless tobacco: Never Used  . Tobacco comment: quit aprrox 50 years ago.  Vaping Use  . Vaping Use: Never used  Substance Use Topics  . Alcohol use: No  . Drug use: No    Home Medications Prior to Admission medications   Medication Sig Start Date End Date Taking? Authorizing Provider  ALPRAZolam Duanne Moron) 0.5 MG tablet Take 0.5 mg by mouth at bedtime as needed for anxiety.   Yes [provider]  rosuvastatin (CRESTOR) 5 MG tablet Take  5 mg by mouth daily.   Yes [provider]  alendronate (FOSAMAX) 70 MG tablet Take 70 mg by mouth every Monday.  09/07/12   [provider]  aspirin EC 81 MG tablet Take 81 mg by mouth daily.    [provider]  calcium carbonate (OS-CAL) 600 MG TABS tablet Take 600 mg by mouth daily with breakfast.     [provider]  cetirizine (ZYRTEC) 10 MG tablet Take 10 mg by mouth daily.    [provider]  cholecalciferol (VITAMIN D) 1000 UNITS tablet Take 2,000 Units by mouth daily.     [provider]  fluticasone (FLONASE) 50 MCG/ACT nasal spray Place 2 sprays into both nostrils daily.  04/28/13   [provider]  guaiFENesin (MUCINEX) 600 MG 12 hr tablet Take 1,200 mg by mouth 2 (two) times daily as needed for to loosen phlegm.     [provider]  ibuprofen (ADVIL,MOTRIN) 200 MG tablet Take 200 mg by mouth every 6 (six) hours as needed for headache (pain).    [provider]  levothyroxine (SYNTHROID, LEVOTHROID) 125 MCG tablet Take 125 mcg by mouth daily before breakfast.    [provider]  MEGARED OMEGA-3 KRILL OIL PO Take 1 capsule by mouth daily.    [provider]  Multiple Vitamin (MULTIVITAMIN WITH MINERALS) TABS tablet Take 1 tablet by mouth daily.    [provider]  nebivolol (BYSTOLIC) 5 MG tablet Take 1 tablet (5 mg total) by mouth daily. 12/25/16   [name], MD  OVER THE COUNTER MEDICATION Place 1 drop into both eyes daily as needed (dry eyes). Over the counter lubricating eye drop    [provider]  OVER THE COUNTER MEDICATION Take 1 tablet by mouth every 4 (four) hours as needed (nasal drainage/ allergies). OTC allergy medication    [provider]  SYNTHROID 112 MCG tablet Take by mouth. 09/27/19   [provider]    Allergies    Patient has no known allergies.  Review of Systems   Review of Systems  Neurological: Positive for headaches.    All other systems reviewed and are negative.   Physical Exam Updated Vital Signs BP 123/65 (BP Location: Right Arm)   Pulse 61   Temp 98 F (36.7 C) (Oral)   Resp 20   Ht 5\' 6"  (1.676 m)   Wt 68 kg   SpO2 98%   BMI 24.21 kg/m   Physical Exam Vitals and nursing note reviewed.   86 year old female, resting comfortably and in no acute distress. Vital signs are normal. Oxygen saturation is 98%, which is normal. Head is normocephalic and atraumatic. PERRLA, EOMI. Oropharynx is clear.  There is tenderness to palpation over the insertion  of the paracervical muscles. Neck is nontender and supple without adenopathy or JVD.  There are no carotid bruits. Back is nontender and there is no CVA tenderness. Lungs are clear without rales, wheezes, or rhonchi. Chest is nontender. Heart has regular rate and rhythm without murmur. Abdomen is soft, flat, nontender without masses or hepatosplenomegaly and peristalsis is normoactive. Extremities have no cyanosis or edema, full range of motion is present. Skin is warm and dry without rash. Neurologic: Mental status is normal, cranial nerves are intact, there are no motor or sensory deficits.  There is no dizziness elicited with passive head movement.  ED Results / Procedures / Treatments   Labs (all labs ordered are listed, but only abnormal results are displayed) Labs Reviewed  CBC - Abnormal; Notable for the following components:      Result Value   RBC 5.28 (*)    Hemoglobin 16.3 (*)    HCT 48.0 (*)    All other components within normal limits  DIFFERENTIAL - Abnormal; Notable for the following components:   Monocytes Absolute 1.1 (*)    All other components within normal limits  COMPREHENSIVE METABOLIC PANEL - Abnormal; Notable for the following components:   CO2 21 (*)    Glucose, Bld 109 (*)    Total Protein 8.3 (*)    GFR calc non Af Amer 56 (*)    All other components within normal limits  URINALYSIS, ROUTINE W REFLEX MICROSCOPIC  - Abnormal; Notable for the following components:   Leukocytes,Ua SMALL (*)    All other components within normal limits  URINALYSIS, MICROSCOPIC (REFLEX) - Abnormal; Notable for the following components:   Bacteria, UA FEW (*)    All other components within normal limits  PROTIME-INR  APTT    EKG EKG Interpretation  Date/Time:  Saturday October 09 2019 18:57:57 EDT Ventricular Rate:  80 PR Interval:    QRS Duration: 78 QT Interval:  402 QTC Calculation: 463 R Axis:   32 Text Interpretation: Accelerated Sinus rhythm Low voltage QRS Nonspecific T wave abnormality Abnormal ECG When compared with ECG of 07/12/2013, No significant change was found Confirmed by Delora Fuel ([id number]) on 10/10/2019 1:53:21 AM   Radiology CT HEAD WO CONTRAST  Result Date: 10/09/2019 CLINICAL DATA:  Neuro deficit, acute, stroke suspected Daughter reports patient is having headache and dizziness for 2 weeks. EXAM: CT HEAD WITHOUT CONTRAST TECHNIQUE: Contiguous axial images were obtained from the base of the skull through the vertex without intravenous contrast. COMPARISON:  Head CT 01/15/2018 FINDINGS: Brain: Stable degree of atrophy and chronic small vessel ischemia. No intracranial hemorrhage, mass effect, or midline shift. No hydrocephalus. The basilar cisterns are patent. No evidence of territorial infarct or acute ischemia. No extra-axial or intracranial fluid collection. Vascular: No hyperdense vessel or unexpected calcification. Skull: No fracture or focal lesion. Sinuses/Orbits: Paranasal sinuses and mastoid air cells are clear. The visualized orbits are unremarkable. Bilateral lens extraction Other: None. IMPRESSION: 1. No acute intracranial abnormality. 2. Stable atrophy and chronic small vessel ischemia. Electronically Signed   By: Keith Rake M.D.   On: 10/09/2019 19:34    Procedures Procedures  Medications Ordered in ED Medications  sodium chloride flush (NS) 0.9 % injection 3 mL (3 mLs Intravenous  Not Given 10/10/19 0006)  sodium chloride 0.9 % bolus 1,000 mL (1,000 mLs Intravenous New Bag/Given 10/10/19 0220)  prochlorperazine (COMPAZINE) injection 10 mg (10 mg Intravenous Given 10/10/19 0222)  dexamethasone (DECADRON) injection 10 mg (10 mg Intravenous Given 10/10/19 0221)  diphenhydrAMINE (BENADRYL) injection 25 mg (25 mg Intravenous Given 10/10/19 0232)    ED Course  I have reviewed the triage vital signs and the nursing notes.  Pertinent labs & imaging results that were available during my care of the patient were reviewed by me and considered in my medical decision making (see chart for details).  MDM Rules/Calculators/A&P Vague symptoms of uncertain cause.  It does sound like she had an episode of vertigo earlier today.  I will plan to give her a prescription for meclizine should vertigo recur.  I suspect her headache was a muscle contraction headache.  She had been sent for CT of head which showed no acute process.  CBC and comprehensive metabolic panel were unremarkable.  Urinalysis did not show any evidence of infection.  She will be given a headache cocktail of normal saline, prochlorperazine, diphenhydramine, dexamethasone.  Old records are reviewed, and she has no relevant past visits.  Following above-noted treatment, patient states that she does not feel any better.  She is discharged with reassurance that her evaluation here did not show any serious problems, follow-up with PCP.  Return precautions discussed.  Final Clinical Impression(s) / ED Diagnoses Final diagnoses:  Bad headache  Malaise  Peripheral vertigo, unspecified laterality    Rx / DC Orders ED Discharge Orders         Ordered    meclizine (ANTIVERT) 25 MG tablet  3 times daily PRN     Discontinue  Reprint     10/10/19 0254           Delora Fuel, MD 86/28/24 [phone]

## 2019-10-18 DIAGNOSIS — I10 Essential (primary) hypertension: Secondary | ICD-10-CM | POA: Diagnosis not present

## 2019-10-18 DIAGNOSIS — E039 Hypothyroidism, unspecified: Secondary | ICD-10-CM | POA: Diagnosis not present

## 2019-10-18 DIAGNOSIS — F418 Other specified anxiety disorders: Secondary | ICD-10-CM | POA: Diagnosis not present

## 2019-10-18 DIAGNOSIS — R002 Palpitations: Secondary | ICD-10-CM | POA: Diagnosis not present

## 2019-10-18 DIAGNOSIS — R42 Dizziness and giddiness: Secondary | ICD-10-CM | POA: Diagnosis not present

## 2019-11-04 ENCOUNTER — Telehealth: Payer: Self-pay | Admitting: *Deleted

## 2019-11-04 ENCOUNTER — Other Ambulatory Visit: Payer: Self-pay | Admitting: *Deleted

## 2019-11-04 DIAGNOSIS — R002 Palpitations: Secondary | ICD-10-CM

## 2019-11-04 DIAGNOSIS — E039 Hypothyroidism, unspecified: Secondary | ICD-10-CM | POA: Diagnosis not present

## 2019-11-04 DIAGNOSIS — G43709 Chronic migraine without aura, not intractable, without status migrainosus: Secondary | ICD-10-CM | POA: Diagnosis not present

## 2019-11-04 DIAGNOSIS — I10 Essential (primary) hypertension: Secondary | ICD-10-CM | POA: Diagnosis not present

## 2019-11-04 DIAGNOSIS — R42 Dizziness and giddiness: Secondary | ICD-10-CM | POA: Diagnosis not present

## 2019-11-04 DIAGNOSIS — I499 Cardiac arrhythmia, unspecified: Secondary | ICD-10-CM | POA: Diagnosis not present

## 2019-11-04 NOTE — Telephone Encounter (Signed)
Patient enrolled for [employer] to ship a 14 day cardiac event monitor to her home.  Patient to call and provide office with updated secondary insurance information.

## 2020-01-20 DIAGNOSIS — Z23 Encounter for immunization: Secondary | ICD-10-CM | POA: Diagnosis not present

## 2020-03-08 DIAGNOSIS — E785 Hyperlipidemia, unspecified: Secondary | ICD-10-CM | POA: Diagnosis not present

## 2020-03-08 DIAGNOSIS — E039 Hypothyroidism, unspecified: Secondary | ICD-10-CM | POA: Diagnosis not present

## 2020-03-08 DIAGNOSIS — M859 Disorder of bone density and structure, unspecified: Secondary | ICD-10-CM | POA: Diagnosis not present

## 2020-03-14 DIAGNOSIS — R42 Dizziness and giddiness: Secondary | ICD-10-CM | POA: Diagnosis not present

## 2020-03-14 DIAGNOSIS — R413 Other amnesia: Secondary | ICD-10-CM | POA: Diagnosis not present

## 2020-03-14 DIAGNOSIS — I10 Essential (primary) hypertension: Secondary | ICD-10-CM | POA: Diagnosis not present

## 2020-03-14 DIAGNOSIS — N1832 Chronic kidney disease, stage 3b: Secondary | ICD-10-CM | POA: Diagnosis not present

## 2020-03-14 DIAGNOSIS — I4892 Unspecified atrial flutter: Secondary | ICD-10-CM | POA: Diagnosis not present

## 2020-03-14 DIAGNOSIS — M8580 Other specified disorders of bone density and structure, unspecified site: Secondary | ICD-10-CM | POA: Diagnosis not present

## 2020-03-14 DIAGNOSIS — E039 Hypothyroidism, unspecified: Secondary | ICD-10-CM | POA: Diagnosis not present

## 2020-03-14 DIAGNOSIS — R1031 Right lower quadrant pain: Secondary | ICD-10-CM | POA: Diagnosis not present

## 2020-03-14 DIAGNOSIS — Z0000 Encounter for general adult medical examination without abnormal findings: Secondary | ICD-10-CM | POA: Diagnosis not present

## 2020-03-14 DIAGNOSIS — I739 Peripheral vascular disease, unspecified: Secondary | ICD-10-CM | POA: Diagnosis not present

## 2020-03-14 DIAGNOSIS — E785 Hyperlipidemia, unspecified: Secondary | ICD-10-CM | POA: Diagnosis not present

## 2020-03-14 DIAGNOSIS — M1990 Unspecified osteoarthritis, unspecified site: Secondary | ICD-10-CM | POA: Diagnosis not present

## 2020-03-16 ENCOUNTER — Other Ambulatory Visit: Payer: Self-pay | Admitting: Internal Medicine

## 2020-03-16 DIAGNOSIS — N1832 Chronic kidney disease, stage 3b: Secondary | ICD-10-CM

## 2020-03-16 DIAGNOSIS — R1031 Right lower quadrant pain: Secondary | ICD-10-CM

## 2020-03-30 ENCOUNTER — Telehealth: Payer: Self-pay | Admitting: *Deleted

## 2020-03-30 NOTE — Telephone Encounter (Signed)
Notes received sent to referral for scheduling [facility] [phone]

## 2020-04-03 NOTE — Progress Notes (Signed)
Cardiology Office Note:    Date:  04/04/2020   ID:  DONN WILMOT, DOB Aug 17, 1932, MRN [id number]  PCP:  Prince Solian, MD  Cardiologist:  Shelva Majestic, MD   Referring MD: Prince Solian, MD   Chief Complaint  Patient presents with  . Atrial Fibrillation    New onset    History of Present Illness:    Danielle [name] is a 87 y.o. female with a hx of carotid artery stenosis s/p right CEA 2005, HTN, HLD, palpitations, hypothyroidism, and headache. Last echocardiogram 07/12/19 showed normal EF of 60-65%, minimally increased RV systolic pressure, mild to moderate aortic valve sclerosis without stenosis, and mildly dilated left atrium. Nuclear stress test in 2015 for surgical clearance was nonischemic. She underwent left and right shoulder replacements in May and Sept 2015, respectively. She was last seen by Dr. [name] 06/24/19 abd was doing well at that time. Carotid artery dopplers in 02/2018 were reviewed with minimal disease bilaterally.   She presents today after being diagnosed with new onset atrial flutter/fibrillation and started on 5 mg eliquis BID. She is here with her daughter, who is [name] at [employer].   She denies palpitations, near/syncope, chest pain and shortness of breath. She does not describe symptoms that raise suspicion for sleep apnea. Notes from PCP reviewed and does mention that she inconsistently takes her thyroid medication. We reviewed taking it every morning on an empty stomach. Sounds like she drinks coffee shortly after taking synthroid. She will work on this. We discussed atrial fibrillation, treatment strategies, and next steps. The only symptoms she reports is increased fatigue over the past few months, which she thinks is related to being inside more with the colder weather. She denies syncope and recent falls.  Past Medical History:  Diagnosis Date  . Anxiety   . Arthritis    Right shoulder arthritis  . Asthma   . Atrial fibrillation ([place])   .  Carotid artery disease ([place])   . Chronic anticoagulation   . Diverticulitis   . Dysrhythmia   . Headache(784.0)   . Heart murmur   . Hypothyroidism   . Memory impairment   . Osteoarthritis of left shoulder 07/20/2013  . Peripheral vascular disease ([place])    carotid   . Primary osteoarthritis of right shoulder 11/16/2013  . Seasonal allergies     Past Surgical History:  Procedure Laterality Date  . BREAST EXCISIONAL BIOPSY Left   . CAROTID ENDARTERECTOMY Right   . CATARACT EXTRACTION W/ INTRAOCULAR LENS  IMPLANT, BILATERAL    . COLON SURGERY    . COLON SURGERY  2005   had a colostomy for 6 months   . COLONOSCOPY    . EYE SURGERY Bilateral    cataracts   . GASTRIC RESTRICTION SURGERY Left 2006  . TONSILLECTOMY    . TOTAL SHOULDER ARTHROPLASTY Left 07/20/2013   Procedure: TOTAL SHOULDER ARTHROPLASTY;  Surgeon: [name], MD;  Location: [facility];  Service: Orthopedics;  Laterality: Left;  . TOTAL SHOULDER ARTHROPLASTY Right 11/16/2013   Dr [name]  . TOTAL SHOULDER ARTHROPLASTY Right 11/16/2013   Procedure: RIGHT TOTAL SHOULDER ARTHROPLASTY;  Surgeon: [name], MD;  Location: [facility];  Service: Orthopedics;  Laterality: Right;    Current Medications: Current Meds  Medication Sig  . alendronate (FOSAMAX) 70 MG tablet Take 70 mg by mouth every Monday.   . ALPRAZolam (XANAX) 0.5 MG tablet Take 0.5 mg by mouth at bedtime as needed for anxiety.  . calcium  carbonate (OS-CAL) 600 MG TABS tablet Take 600 mg by mouth daily with breakfast.   . cetirizine (ZYRTEC) 10 MG tablet Take 10 mg by mouth daily.  . cholecalciferol (VITAMIN D) 1000 UNITS tablet Take 2,000 Units by mouth daily.  Marland Kitchen ELIQUIS 5 MG TABS tablet Take 5 mg by mouth 2 (two) times daily.  . fluticasone (FLONASE) 50 MCG/ACT nasal spray Place 2 sprays into both nostrils daily.  Marland Kitchen guaiFENesin (MUCINEX) 600 MG 12 hr tablet Take 1,200 mg by mouth 2 (two) times daily as needed for to loosen phlegm.   Marland Kitchen ibuprofen (ADVIL,MOTRIN)  200 MG tablet Take 200 mg by mouth every 6 (six) hours as needed for headache (pain).  . meclizine (ANTIVERT) 25 MG tablet Take 1 tablet (25 mg total) by mouth 3 (three) times daily as needed for dizziness.  Marland Kitchen MEGARED OMEGA-3 KRILL OIL PO Take 1 capsule by mouth daily.  . Multiple Vitamin (MULTIVITAMIN WITH MINERALS) TABS tablet Take 1 tablet by mouth daily.  . nebivolol (BYSTOLIC) 5 MG tablet Take 1 tablet (5 mg total) by mouth daily.  Marland Kitchen OVER THE COUNTER MEDICATION Place 1 drop into both eyes daily as needed (dry eyes). Over the counter lubricating eye drop  . OVER THE COUNTER MEDICATION Take 1 tablet by mouth every 4 (four) hours as needed (nasal drainage/ allergies). OTC allergy medication  . rosuvastatin (CRESTOR) 5 MG tablet Take 5 mg by mouth daily.  Marland Kitchen SYNTHROID 112 MCG tablet Take by mouth.     Allergies:   Patient has no known allergies.   Social History   Socioeconomic History  . Marital status: Married    Spouse name: Not on file  . Number of children: Not on file  . Years of education: Not on file  . Highest education level: Not on file  Occupational History  . Not on file  Tobacco Use  . Smoking status: Former Smoker    Packs/day: 0.25    Years: 2.00    Pack years: 0.50    Types: Cigarettes    Quit date: 07/13/1963    Years since quitting: 56.7  . Smokeless tobacco: Never Used  . Tobacco comment: quit aprrox 50 years ago.  Vaping Use  . Vaping Use: Never used  Substance and Sexual Activity  . Alcohol use: No  . Drug use: No  . Sexual activity: Not on file  Other Topics Concern  . Not on file  Social History Narrative  . Not on file   Social Determinants of Health   Financial Resource Strain: Not on file  Food Insecurity: Not on file  Transportation Needs: Not on file  Physical Activity: Not on file  Stress: Not on file  Social Connections: Not on file     Family History: The patient's family history includes Cancer - Lung in her mother; Cancer - Other  in an other family member.  ROS:   Please see the history of present illness.     All other systems reviewed and are negative.  EKGs/Labs/Other Studies Reviewed:    The following studies were reviewed today:  Echo 07/12/19 1. Left ventricular ejection fraction, by estimation, is 60 to 65%. The  left ventricle has normal function. The left ventricle has no regional  wall motion abnormalities. Indeterminate diastolic filling due to E-A  fusion.  2. Right ventricular systolic function is normal. The right ventricular  size is normal. There is normal pulmonary artery systolic pressure. The  estimated right ventricular systolic pressure is  30.7 mmHg.  3. Left atrial size was mildly dilated.  4. The mitral valve is normal in structure. Mild mitral valve  regurgitation. No evidence of mitral stenosis.  5. The aortic valve is tricuspid. Aortic valve regurgitation is mild.  Mild to moderate aortic valve sclerosis/calcification is present, without  any evidence of aortic stenosis.  6. The inferior vena cava is normal in size with greater than 50%  respiratory variability, suggesting right atrial pressure of 3 mmHg.    EKG:  EKG is  ordered today.  The ekg ordered today demonstrates atrial fibrillation with ventricular rate 81  Recent Labs: 10/09/2019: ALT 26; BUN 15; Creatinine, Ser 0.93; Hemoglobin 16.3; Platelets 184; Potassium 4.1; Sodium 138  Recent Lipid Panel No results found for: CHOL, TRIG, HDL, CHOLHDL, VLDL, LDLCALC, LDLDIRECT  Physical Exam:    VS:  BP 138/74   Pulse 81   Ht 5\' 7"  (1.702 m)   Wt 152 lb 9.6 oz (69.2 kg)   SpO2 97%   BMI 23.90 kg/m     Wt Readings from Last 3 Encounters:  04/04/20 152 lb 9.6 oz (69.2 kg)  10/09/19 150 lb (68 kg)  06/24/19 153 lb 12.8 oz (69.8 kg)     GEN: elderly female in NAD HEENT: Normal NECK: No JVD; No carotid bruits LYMPHATICS: No lymphadenopathy CARDIAC: irregular rhythm, regular rate, no murmurs, rubs,  gallops RESPIRATORY:  Clear to auscultation without rales, wheezing or rhonchi  ABDOMEN: Soft, non-tender, non-distended MUSCULOSKELETAL:  No edema; No deformity  SKIN: Warm and dry NEUROLOGIC:  Alert and oriented x 3 PSYCHIATRIC:  Normal affect   ASSESSMENT:    1. Paroxysmal A-fib ([place])   2. Chronic anticoagulation   3. Mixed hyperlipidemia   4. Hypothyroidism, unspecified type   5. Bilateral carotid artery stenosis   6. S/P carotid endarterectomy    PLAN:    In order of problems listed above:  Atrial fibrillation  - new onset - EKG today shows rate controlled atrial fibrillation, ventricular rate 81 - so far, she is tolerating the rate well, but does report more fatigue - question rate control - will repeat an echocardiogram - I think rhythm control will be difficult until we get her thyroid under control - will see her back in 1 month and can discuss possible cardioversion at that time   Chronic anticoagulation This patients CHA2DS2-VASc Score and unadjusted Ischemic Stroke Rate (% per year) is equal to 7.2 % stroke rate/year from a score of 5 (2age, HTN, female, PAD) - taking 5 mg eliquis BID - discussed eliminating trip and fall risks in the home - sCr at PCP was 1.3, has been closer to 1 --> will need to watch this closely to make sure she doesn't need to be on lower dose of eliquis - repeat BMP at next visit   Hyperlipidemia - continue 5 mg crestor - no recent labs - repeat at next visit   Hypothyroidism - followed by PCP - some noncompliance mentioned - discussed taking synthroid daily on empty stomach without coffee for 1 hr   Carotid artery disease s/p CEA - carotid artery dopplers need to be repeated - last study in 2019 - carotid US ordered     Medication Adjustments/Labs and Tests Ordered: Current medicines are reviewed at length with the patient today.  Concerns regarding medicines are outlined above.  Orders Placed This Encounter  Procedures  .  EKG 12-Lead  . ECHOCARDIOGRAM COMPLETE  . VAS US CAROTID   No orders  of the defined types were placed in this encounter.   Signed, Ledora Bottcher, PA  04/04/2020 1:19 PM    [facility]

## 2020-04-04 ENCOUNTER — Encounter: Payer: Self-pay | Admitting: Physician Assistant

## 2020-04-04 ENCOUNTER — Ambulatory Visit (INDEPENDENT_AMBULATORY_CARE_PROVIDER_SITE_OTHER): Payer: Medicare Other | Admitting: Physician Assistant

## 2020-04-04 ENCOUNTER — Other Ambulatory Visit: Payer: Self-pay

## 2020-04-04 VITALS — BP 138/74 | HR 81 | Ht 67.0 in | Wt 152.6 lb

## 2020-04-04 DIAGNOSIS — I480 Paroxysmal atrial fibrillation: Secondary | ICD-10-CM

## 2020-04-04 DIAGNOSIS — E039 Hypothyroidism, unspecified: Secondary | ICD-10-CM

## 2020-04-04 DIAGNOSIS — E782 Mixed hyperlipidemia: Secondary | ICD-10-CM

## 2020-04-04 DIAGNOSIS — Z7901 Long term (current) use of anticoagulants: Secondary | ICD-10-CM | POA: Diagnosis not present

## 2020-04-04 DIAGNOSIS — Z9889 Other specified postprocedural states: Secondary | ICD-10-CM

## 2020-04-04 DIAGNOSIS — I6523 Occlusion and stenosis of bilateral carotid arteries: Secondary | ICD-10-CM

## 2020-04-04 NOTE — Patient Instructions (Addendum)
Medication Instructions:  No Changes *If you need a refill on your cardiac medications before your next appointment, please call your pharmacy*   Lab Work: No labs If you have labs (blood work) drawn today and your tests are completely normal, you will receive your results only by: Marland Kitchen MyChart Message (if you have MyChart) OR . A paper copy in the mail If you have any lab test that is abnormal or we need to change your treatment, we will call you to review the results.   Testing/Procedures: [address], [place] has requested that you have an echocardiogram. Echocardiography is a painless test that uses sound waves to create images of your heart. It provides your doctor with information about the size and shape of your heart and how well your heart's chambers and valves are working. This procedure takes approximately one hour. There are no restrictions for this procedure.  [facility] [address] Your physician has requested that you have a carotid duplex. This test is an ultrasound of the carotid arteries in your neck. It looks at blood flow through these arteries that supply the brain with blood. Allow one hour for this exam. There are no restrictions or special instructions.   Follow-Up: At [facility], you and your health needs are our priority.  As part of our continuing mission to provide you with exceptional heart care, we have created designated Provider Care Teams.  These Care Teams include your primary Cardiologist (physician) and Advanced Practice Providers (APPs -  Physician Assistants and Nurse Practitioners) who all work together to provide you with the care you need, when you need it.  We recommend signing up for the patient portal called "MyChart".  Sign up information is provided on this After Visit Summary.  MyChart is used to connect with patients for Virtual Visits (Telemedicine).  Patients are able to view lab/test results,  encounter notes, upcoming appointments, etc.  Non-urgent messages can be sent to your provider as well.   To learn more about what you can do with MyChart, go to [email or web address].    Your next appointment:   3 week(s)  The format for your next appointment:   In Person  Provider:  Shelva Majestic or  Fabian Sharp PA-C      Please call office if there is an increase in Palpitations of Flutter

## 2020-04-12 ENCOUNTER — Ambulatory Visit
Admission: RE | Admit: 2020-04-12 | Discharge: 2020-04-12 | Disposition: A | Discharge status: Home / Self Care | Payer: Medicare Other | Source: Ambulatory Visit | Attending: Internal Medicine | Admitting: Internal Medicine

## 2020-04-12 ENCOUNTER — Other Ambulatory Visit: Payer: Self-pay | Admitting: Internal Medicine

## 2020-04-12 ENCOUNTER — Other Ambulatory Visit: Payer: Self-pay

## 2020-04-12 DIAGNOSIS — R1031 Right lower quadrant pain: Secondary | ICD-10-CM

## 2020-04-12 DIAGNOSIS — K760 Fatty (change of) liver, not elsewhere classified: Secondary | ICD-10-CM | POA: Diagnosis not present

## 2020-04-12 DIAGNOSIS — N281 Cyst of kidney, acquired: Secondary | ICD-10-CM | POA: Diagnosis not present

## 2020-04-12 DIAGNOSIS — N189 Chronic kidney disease, unspecified: Secondary | ICD-10-CM | POA: Diagnosis not present

## 2020-04-12 DIAGNOSIS — N1832 Chronic kidney disease, stage 3b: Secondary | ICD-10-CM

## 2020-04-25 ENCOUNTER — Other Ambulatory Visit: Payer: Self-pay | Admitting: Internal Medicine

## 2020-04-25 DIAGNOSIS — Z1231 Encounter for screening mammogram for malignant neoplasm of breast: Secondary | ICD-10-CM

## 2020-04-26 ENCOUNTER — Ambulatory Visit (HOSPITAL_BASED_OUTPATIENT_CLINIC_OR_DEPARTMENT_OTHER): Payer: Medicare Other

## 2020-04-26 ENCOUNTER — Ambulatory Visit (HOSPITAL_COMMUNITY)
Admission: RE | Admit: 2020-04-26 | Discharge: 2020-04-26 | Disposition: A | Discharge status: Home / Self Care | Payer: Medicare Other | Source: Ambulatory Visit | Attending: Cardiovascular Disease | Admitting: Cardiovascular Disease

## 2020-04-26 ENCOUNTER — Other Ambulatory Visit: Payer: Self-pay

## 2020-04-26 ENCOUNTER — Other Ambulatory Visit (HOSPITAL_COMMUNITY): Payer: Self-pay | Admitting: Physician Assistant

## 2020-04-26 DIAGNOSIS — I480 Paroxysmal atrial fibrillation: Secondary | ICD-10-CM

## 2020-04-26 DIAGNOSIS — R413 Other amnesia: Secondary | ICD-10-CM | POA: Diagnosis not present

## 2020-04-26 DIAGNOSIS — E039 Hypothyroidism, unspecified: Secondary | ICD-10-CM | POA: Diagnosis not present

## 2020-04-26 DIAGNOSIS — R1031 Right lower quadrant pain: Secondary | ICD-10-CM | POA: Diagnosis not present

## 2020-04-26 DIAGNOSIS — M8580 Other specified disorders of bone density and structure, unspecified site: Secondary | ICD-10-CM | POA: Diagnosis not present

## 2020-04-26 DIAGNOSIS — R42 Dizziness and giddiness: Secondary | ICD-10-CM | POA: Diagnosis not present

## 2020-04-26 DIAGNOSIS — I4892 Unspecified atrial flutter: Secondary | ICD-10-CM | POA: Diagnosis not present

## 2020-04-26 DIAGNOSIS — F418 Other specified anxiety disorders: Secondary | ICD-10-CM | POA: Diagnosis not present

## 2020-04-26 DIAGNOSIS — Z48812 Encounter for surgical aftercare following surgery on the circulatory system: Secondary | ICD-10-CM | POA: Diagnosis not present

## 2020-04-26 DIAGNOSIS — Z9889 Other specified postprocedural states: Secondary | ICD-10-CM

## 2020-04-26 DIAGNOSIS — N1832 Chronic kidney disease, stage 3b: Secondary | ICD-10-CM | POA: Diagnosis not present

## 2020-04-26 DIAGNOSIS — E785 Hyperlipidemia, unspecified: Secondary | ICD-10-CM | POA: Diagnosis not present

## 2020-04-26 DIAGNOSIS — I739 Peripheral vascular disease, unspecified: Secondary | ICD-10-CM | POA: Diagnosis not present

## 2020-04-26 DIAGNOSIS — M1990 Unspecified osteoarthritis, unspecified site: Secondary | ICD-10-CM | POA: Diagnosis not present

## 2020-04-26 DIAGNOSIS — I10 Essential (primary) hypertension: Secondary | ICD-10-CM | POA: Diagnosis not present

## 2020-04-26 LAB — ECHOCARDIOGRAM COMPLETE
MV M vel: 4.38 m/s
MV Peak grad: 76.7 mmHg
S' Lateral: 1.8 cm

## 2020-04-26 NOTE — Progress Notes (Signed)
Cardiology Office Note:    Date:  05/01/2020   ID:  Danielle [name], DOB 07-08-1932, MRN [id number]  PCP:  Prince Solian, MD  Cardiologist:  Shelva Majestic, MD   Referring MD: Prince Solian, MD   Chief Complaint  Patient presents with  . Follow-up    Atrial flutter    History of Present Illness:    Danielle [name] is a 87 y.o. female with a hx of carotid artery stenosis s/p right CEA 2005, HTN, HLD, palpitations, hypothyroidism, and headache. Last echocardiogram 07/12/19 showed normal EF of 60-65%, minimally increased RV systolic pressure, mild to moderate aortic valve sclerosis without stenosis, and mildly dilated left atrium. Nuclear stress test in 2015 for surgical clearance was nonischemic. She underwent left and right shoulder replacements in May and Sept 2015, respectively. She was last seen by Dr. [name] 06/24/19 abd was doing well at that time. Carotid artery dopplers in 02/2018 were reviewed with minimal disease bilaterally.   I saw her on 04/04/20 in follow up after new diagnosis of Afib and started on eliquis. Her daughter is Ms [name] from [employer]. Pt inconsistently takes her thyroid medication. No symptoms of OSA. Advised medication compliance. She was rate controlled in the 80s.   I repeated and echo that showed normal EF of 60-65%, no RWMA, mild concentric LVH, moderate LAE, and mild to moderate MR.  Carotid artery duplex was also repeated and showed mild nonobstructive bilateral stenosis.   She presents back for follow up. We reviewed her studies. She continues to report fatigue. Unclear if this is related to Afib or abnormal thyroid. She just had TSH drawn with Dr. Dagmar Hait - we will get these records   Past Medical History:  Diagnosis Date  . Anxiety   . Arthritis    Right shoulder arthritis  . Asthma   . Atrial fibrillation ([place])   . Carotid artery disease ([place])   . Chronic anticoagulation   . Diverticulitis   . Dysrhythmia   . Headache(784.0)   . Heart  murmur   . Hypothyroidism   . Memory impairment   . Osteoarthritis of left shoulder 07/20/2013  . Peripheral vascular disease ([place])    carotid   . Primary osteoarthritis of right shoulder 11/16/2013  . Seasonal allergies     Past Surgical History:  Procedure Laterality Date  . BREAST EXCISIONAL BIOPSY Left   . CAROTID ENDARTERECTOMY Right   . CATARACT EXTRACTION W/ INTRAOCULAR LENS  IMPLANT, BILATERAL    . COLON SURGERY    . COLON SURGERY  2005   had a colostomy for 6 months   . COLONOSCOPY    . EYE SURGERY Bilateral    cataracts   . GASTRIC RESTRICTION SURGERY Left 2006  . TONSILLECTOMY    . TOTAL SHOULDER ARTHROPLASTY Left 07/20/2013   Procedure: TOTAL SHOULDER ARTHROPLASTY;  Surgeon: [name], MD;  Location: [place];  Service: Orthopedics;  Laterality: Left;  . TOTAL SHOULDER ARTHROPLASTY Right 11/16/2013   Dr [name]  . TOTAL SHOULDER ARTHROPLASTY Right 11/16/2013   Procedure: RIGHT TOTAL SHOULDER ARTHROPLASTY;  Surgeon: [name], MD;  Location: [place];  Service: Orthopedics;  Laterality: Right;    Current Medications: Current Meds  Medication Sig  . alendronate (FOSAMAX) 70 MG tablet Take 70 mg by mouth every Monday.   . ALPRAZolam (XANAX) 0.5 MG tablet Take 0.5 mg by mouth at bedtime as needed for anxiety.  . calcium carbonate (OS-CAL) 600 MG TABS tablet Take 600 mg by  mouth daily with breakfast.   . cetirizine (ZYRTEC) 10 MG tablet Take 10 mg by mouth daily.  . cholecalciferol (VITAMIN D) 1000 UNITS tablet Take 2,000 Units by mouth daily.  Marland Kitchen ELIQUIS 5 MG TABS tablet Take 5 mg by mouth 2 (two) times daily.  . fluticasone (FLONASE) 50 MCG/ACT nasal spray Place 2 sprays into both nostrils daily.  Marland Kitchen guaiFENesin (MUCINEX) 600 MG 12 hr tablet Take 1,200 mg by mouth 2 (two) times daily as needed for to loosen phlegm.   Marland Kitchen ibuprofen (ADVIL,MOTRIN) 200 MG tablet Take 200 mg by mouth every 6 (six) hours as needed for headache (pain).  . meclizine (ANTIVERT) 25 MG tablet  Take 1 tablet (25 mg total) by mouth 3 (three) times daily as needed for dizziness.  Marland Kitchen MEGARED OMEGA-3 KRILL OIL PO Take 1 capsule by mouth daily.  . Multiple Vitamin (MULTIVITAMIN WITH MINERALS) TABS tablet Take 1 tablet by mouth daily.  . nebivolol (BYSTOLIC) 5 MG tablet Take 1 tablet (5 mg total) by mouth daily.  Marland Kitchen OVER THE COUNTER MEDICATION Place 1 drop into both eyes daily as needed (dry eyes). Over the counter lubricating eye drop  . OVER THE COUNTER MEDICATION Take 1 tablet by mouth every 4 (four) hours as needed (nasal drainage/ allergies). OTC allergy medication  . rosuvastatin (CRESTOR) 5 MG tablet Take 5 mg by mouth daily.  Marland Kitchen SYNTHROID 112 MCG tablet Take by mouth.     Allergies:   Patient has no known allergies.   Social History   Socioeconomic History  . Marital status: Married    Spouse name: Not on file  . Number of children: Not on file  . Years of education: Not on file  . Highest education level: Not on file  Occupational History  . Not on file  Tobacco Use  . Smoking status: Former Smoker    Packs/day: 0.25    Years: 2.00    Pack years: 0.50    Types: Cigarettes    Quit date: 07/13/1963    Years since quitting: 56.8  . Smokeless tobacco: Never Used  . Tobacco comment: quit aprrox 50 years ago.  Vaping Use  . Vaping Use: Never used  Substance and Sexual Activity  . Alcohol use: No  . Drug use: No  . Sexual activity: Not on file  Other Topics Concern  . Not on file  Social History Narrative  . Not on file   Social Determinants of Health   Financial Resource Strain: Not on file  Food Insecurity: Not on file  Transportation Needs: Not on file  Physical Activity: Not on file  Stress: Not on file  Social Connections: Not on file     Family History: The patient's family history includes Cancer - Lung in her mother; Cancer - Other in an other family member.  ROS:   Please see the history of present illness.     All other systems reviewed and are  negative.  EKGs/Labs/Other Studies Reviewed:    The following studies were reviewed today:  Echo 04/26/20: 1. Left ventricular ejection fraction, by estimation, is 60 to 65%. The  left ventricle has normal function. The left ventricle has no regional  wall motion abnormalities. There is mild concentric left ventricular  hypertrophy. Left ventricular diastolic  function could not be evaluated.  2. Right ventricular systolic function is normal. The right ventricular  size is normal. There is normal pulmonary artery systolic pressure.  3. Left atrial size was mild to moderately  dilated.  4. Right atrial size was mildly dilated.  5. The mitral valve is degenerative. Mild to moderate mitral valve  regurgitation. Moderate mitral annular calcification.  6. Tricuspid valve regurgitation is mild to moderate.  7. The aortic valve is tricuspid. There is mild calcification of the  aortic valve. There is mild thickening of the aortic valve. Aortic valve  regurgitation is mild. Mild to moderate aortic valve  sclerosis/calcification is present, without any evidence  of aortic stenosis.  8. The inferior vena cava is normal in size with <50% respiratory  variability, suggesting right atrial pressure of 8 mmHg.   Carotid artery doppler 04/26/20: Summary:  Right Carotid: Velocities in the right ICA are consistent with a stable  1-39%  stenosis. S/p CEA.   Left     Velocities in the left ICA are consistent with a stable 1-39%  Carotid:   stenosis.   Vertebrals: Bilateral vertebral arteries demonstrate antegrade flow.  Subclavians: Normal flow hemodynamics were seen in bilateral subclavian        arteries.   EKG:  EKG is ordered today.  The ekg ordered today demonstrates atrial flutter with ventricular rate 78  Recent Labs: 10/09/2019: ALT 26; BUN 15; Creatinine, Ser 0.93; Hemoglobin 16.3; Platelets 184; Potassium 4.1; Sodium 138  Recent Lipid Panel No results found for:  CHOL, TRIG, HDL, CHOLHDL, VLDL, LDLCALC, LDLDIRECT  Physical Exam:    VS:  BP 127/74   Pulse 88   Ht 5\' 7"  (1.702 m)   Wt 153 lb 6.4 oz (69.6 kg)   SpO2 98%   BMI 24.03 kg/m     Wt Readings from Last 3 Encounters:  05/01/20 153 lb 6.4 oz (69.6 kg)  04/04/20 152 lb 9.6 oz (69.2 kg)  10/09/19 150 lb (68 kg)     GEN:  Very pleasant elderly female in no acute distress HEENT: Normal NECK: No JVD; No carotid bruits LYMPHATICS: No lymphadenopathy CARDIAC: irregular rhythm, regular rate, no murmur RESPIRATORY:  Clear to auscultation without rales, wheezing or rhonchi  ABDOMEN: Soft, non-tender, non-distended MUSCULOSKELETAL:  No edema; No deformity  SKIN: Warm and dry NEUROLOGIC:  Alert and oriented x 3 PSYCHIATRIC:  Normal affect   ASSESSMENT:    1. Paroxysmal A-fib ([place])   2. Chronic anticoagulation   3. Mixed hyperlipidemia   4. Bilateral carotid artery stenosis   5. Mitral valve insufficiency, unspecified etiology    PLAN:    In order of problems listed above:  Atrial flutter/fibrillaiton - diagnosed Jan 2022 with PCP - in the setting of incomplete compliance with thyroid medication and TSH of 69 - she is taking thyroid medication as prescribed - will defer DCCV consideration until TSH is normalized   Chronic anticoagulation This patients CHA2DS2-VASc Score and unadjusted Ischemic Stroke Rate (% per year) is equal to 7.2 % stroke rate/year from a score of 5 (4age, HTN, female, PAD) - continue eliquis 5 mg BID - Echo repeated and shows normal EF and mild to moderate MR   Mild to moderate MR - consider repeat echo in 1 year   Hyperlipidemia - continue 5 mg crestor   Hypothyroidism - noncompliant with thyroid medicaiton - TSH was 69 - will get updated TSH from Dr. Danna Hefty office   Carotid artery disease - dopplers repeated with continued mild disease - repeat in 1 year, sooner if symptoms   Follow up with Dr. [name] as schedule.   Medication  Adjustments/Labs and Tests Ordered: Current medicines are reviewed at length with the  patient today.  Concerns regarding medicines are outlined above.  Orders Placed This Encounter  Procedures  . EKG 12-Lead   No orders of the defined types were placed in this encounter.   Signed, Ledora Bottcher, PA  05/01/2020 2:23 PM    [facility]

## 2020-04-27 DIAGNOSIS — R82998 Other abnormal findings in urine: Secondary | ICD-10-CM | POA: Diagnosis not present

## 2020-05-01 ENCOUNTER — Encounter: Payer: Self-pay | Admitting: Physician Assistant

## 2020-05-01 ENCOUNTER — Other Ambulatory Visit: Payer: Self-pay

## 2020-05-01 ENCOUNTER — Ambulatory Visit (INDEPENDENT_AMBULATORY_CARE_PROVIDER_SITE_OTHER): Payer: Medicare Other | Admitting: Physician Assistant

## 2020-05-01 VITALS — BP 127/74 | HR 88 | Ht 67.0 in | Wt 153.4 lb

## 2020-05-01 DIAGNOSIS — I480 Paroxysmal atrial fibrillation: Secondary | ICD-10-CM

## 2020-05-01 DIAGNOSIS — I340 Nonrheumatic mitral (valve) insufficiency: Secondary | ICD-10-CM

## 2020-05-01 DIAGNOSIS — E782 Mixed hyperlipidemia: Secondary | ICD-10-CM

## 2020-05-01 DIAGNOSIS — Z7901 Long term (current) use of anticoagulants: Secondary | ICD-10-CM | POA: Diagnosis not present

## 2020-05-01 DIAGNOSIS — I6523 Occlusion and stenosis of bilateral carotid arteries: Secondary | ICD-10-CM

## 2020-05-01 NOTE — Patient Instructions (Signed)
Medication Instructions:  No Changes *If you need a refill on your cardiac medications before your next appointment, please call your pharmacy*   Lab Work: No Labs If you have labs (blood work) drawn today and your tests are completely normal, you will receive your results only by: Marland Kitchen MyChart Message (if you have MyChart) OR . A paper copy in the mail If you have any lab test that is abnormal or we need to change your treatment, we will call you to review the results.   Testing/Procedures: No Testing   Follow-Up: At [facility], you and your health needs are our priority.  As part of our continuing mission to provide you with exceptional heart care, we have created designated Provider Care Teams.  These Care Teams include your primary Cardiologist (physician) and Advanced Practice Providers (APPs -  Physician Assistants and Nurse Practitioners) who all work together to provide you with the care you need, when you need it.  We recommend signing up for the patient portal called "MyChart".  Sign up information is provided on this After Visit Summary.  MyChart is used to connect with patients for Virtual Visits (Telemedicine).  Patients are able to view lab/test results, encounter notes, upcoming appointments, etc.  Non-urgent messages can be sent to your provider as well.   To learn more about what you can do with MyChart, go to [email or web address].    Your next appointment:   Jul 14, 2020 2:00 PM  The format for your next appointment:   In Person  Provider:   Shelva Majestic, MD

## 2020-07-12 DIAGNOSIS — I739 Peripheral vascular disease, unspecified: Secondary | ICD-10-CM | POA: Diagnosis not present

## 2020-07-12 DIAGNOSIS — R1031 Right lower quadrant pain: Secondary | ICD-10-CM | POA: Diagnosis not present

## 2020-07-12 DIAGNOSIS — E785 Hyperlipidemia, unspecified: Secondary | ICD-10-CM | POA: Diagnosis not present

## 2020-07-12 DIAGNOSIS — M8580 Other specified disorders of bone density and structure, unspecified site: Secondary | ICD-10-CM | POA: Diagnosis not present

## 2020-07-12 DIAGNOSIS — F418 Other specified anxiety disorders: Secondary | ICD-10-CM | POA: Diagnosis not present

## 2020-07-12 DIAGNOSIS — R634 Abnormal weight loss: Secondary | ICD-10-CM | POA: Diagnosis not present

## 2020-07-12 DIAGNOSIS — I10 Essential (primary) hypertension: Secondary | ICD-10-CM | POA: Diagnosis not present

## 2020-07-12 DIAGNOSIS — E039 Hypothyroidism, unspecified: Secondary | ICD-10-CM | POA: Diagnosis not present

## 2020-07-12 DIAGNOSIS — R413 Other amnesia: Secondary | ICD-10-CM | POA: Diagnosis not present

## 2020-07-12 DIAGNOSIS — I4892 Unspecified atrial flutter: Secondary | ICD-10-CM | POA: Diagnosis not present

## 2020-07-12 DIAGNOSIS — N1832 Chronic kidney disease, stage 3b: Secondary | ICD-10-CM | POA: Diagnosis not present

## 2020-07-12 DIAGNOSIS — M1990 Unspecified osteoarthritis, unspecified site: Secondary | ICD-10-CM | POA: Diagnosis not present

## 2020-07-13 ENCOUNTER — Ambulatory Visit (INDEPENDENT_AMBULATORY_CARE_PROVIDER_SITE_OTHER): Payer: Medicare Other | Admitting: Cardiovascular Disease

## 2020-07-13 ENCOUNTER — Encounter: Payer: Self-pay | Admitting: Cardiovascular Disease

## 2020-07-13 ENCOUNTER — Other Ambulatory Visit: Payer: Self-pay

## 2020-07-13 VITALS — BP 120/70 | HR 78 | Ht 67.0 in | Wt 145.0 lb

## 2020-07-13 DIAGNOSIS — Z9889 Other specified postprocedural states: Secondary | ICD-10-CM | POA: Diagnosis not present

## 2020-07-13 DIAGNOSIS — I340 Nonrheumatic mitral (valve) insufficiency: Secondary | ICD-10-CM | POA: Diagnosis not present

## 2020-07-13 DIAGNOSIS — R002 Palpitations: Secondary | ICD-10-CM

## 2020-07-13 DIAGNOSIS — E782 Mixed hyperlipidemia: Secondary | ICD-10-CM

## 2020-07-13 DIAGNOSIS — I480 Paroxysmal atrial fibrillation: Secondary | ICD-10-CM | POA: Diagnosis not present

## 2020-07-13 DIAGNOSIS — I6523 Occlusion and stenosis of bilateral carotid arteries: Secondary | ICD-10-CM | POA: Diagnosis not present

## 2020-07-13 DIAGNOSIS — Z7901 Long term (current) use of anticoagulants: Secondary | ICD-10-CM | POA: Diagnosis not present

## 2020-07-13 DIAGNOSIS — E039 Hypothyroidism, unspecified: Secondary | ICD-10-CM | POA: Diagnosis not present

## 2020-07-13 MED ORDER — NEBIVOLOL HCL 5 MG PO TABS: 7.5000 mg | ORAL_TABLET | Freq: Every day | ORAL | 3 refills | Status: DC

## 2020-07-13 NOTE — Patient Instructions (Signed)
Medication Instructions:   -Increase nebivolol (bystolic) 7.5mg  once daily.  *If you need a refill on your cardiac medications before your next appointment, please call your pharmacy*   Follow-Up: At [facility], you and your health needs are our priority.  As part of our continuing mission to provide you with exceptional heart care, we have created designated Provider Care Teams.  These Care Teams include your primary Cardiologist (physician) and Advanced Practice Providers (APPs -  Physician Assistants and Nurse Practitioners) who all work together to provide you with the care you need, when you need it.  We recommend signing up for the patient portal called "MyChart".  Sign up information is provided on this After Visit Summary.  MyChart is used to connect with patients for Virtual Visits (Telemedicine).  Patients are able to view lab/test results, encounter notes, upcoming appointments, etc.  Non-urgent messages can be sent to your provider as well.   To learn more about what you can do with MyChart, go to [email or web address].    Your next appointment:   4 week(s)  The format for your next appointment:   In Person  Provider:   Shelva Majestic, MD

## 2020-07-13 NOTE — Progress Notes (Signed)
Patient ID: Danielle [name], female   DOB: 10/24/1932, 87 y.o.   MRN: [id number]     HPI: Danielle [name] is a 87 y.o. female who presents to the office today for a 41 month cardiology evaluation.  [name] has a history of peripheral vascular disease and is status post right carotid endarterectomy in 2005. She also has a history of hypothyroidism, hypertension, palpitations, hyperlipidemia and has intermittent seasonal allergies. In January 2011 Echo Doppler study showed normal systolic function with grade 1 diastolic dysfunction, mild MR and TR and mild aortic sclerosis. A nuclear perfusion study at that time revealed normal perfusion.  A carotid duplex study was in January 2012 was was mildly abnormal with irregular mixed density plaque but was without significant additional stenosis.  She sees Dr Dagmar Hait, for primary care. Laboratory in September 2013 showed a cholesterol of 136 triglyceride 64 HDL 50 LDL 73. Normal renal function. She was not anemic.  Danielle [name] underwent  bilateral cataract surgery last summer by Dr. Prudencio Burly at [facility]. She tolerated surgery well from a cardiovascular standpoint. She denies recent leg swelling although she does have a prescription for Lasix. She is tolerating her Bystolic and denies any recent palpitations.   Her last echo Doppler study was in October 2014 and this revealed normal systolic function with an ejection fraction of 60-65%.  She did not have regional wall motion abnormalities.  There is grade 1 diastolic dysfunction.  There was evidence for aortic valve sclerosis without stenosis, mild to moderate mitral regurgitation, and mild tricuspid regurgitation.  She had mild pulmonary hypertension with an estimated RV systolic pressure at 33 mm.  Prior to planned left shoulder replacement in May 2015, she underwent a preoperative nuclear perfusion study  in April 2015.  This was low risk and showed some basal septal, artifact without scar or  ischemia.  Danielle [name] underwent left shoulder surgery and on 07/20/2013 and on 11/16/2013 underwent right shoulder replacement surgery by Dr. [name].  She did have some confusion, which may be medication related.  Following her second surgery.  She tolerated both of these were cardiovascular standpoint.  When I last saw her in 2018 after not having seen her in over 2 years she was without episodes of chest pain.  She had seen Dr. Dagmar Hait.  Laboratory was performed.  She was found to have an over suppressed thyroid with a TSH of 0.09 and her Synthroid dose was reduced from 137 g to 125 g.  In addition, her LDL was increased at 121 and apparently she was just started on a prescription of very low-dose rosuvastatin at 5 mg.  She tells me that last week she tripped and fell backwards and hit her head.  She underwent an CT evaluation on 12/15/2016, which did not show any acute traumatic finding.  There was ordinary.  Generalized atrophy with mild chronic small vessel changes of the white matter.  She is unaware of palpitations.   I recommended that he undergo a follow-up echo Doppler study which was done on January 19, 2017. This revealed normal LV function with EF 55 to 60%. There was abnormal tissue Doppler. There was evidence for aortic sclerosis without stenosis with mild aortic insufficiency.  She underwent follow-up carotid studies in December 2019 which showed mild 1 to 39% stenoses in the right and left ICA. There was nonsignificant plaque in the common carotid artery on the left. She had normal flow dynamics in the vertebral and subclavian vessels.  I last saw her in April 2021 after not having seen her since October 2018.  At that time she denied any episodes of chest pain and was continued to see Dr. Elsworth Soho who was checking her laboratory.   She states that she is now on rosuvastatin but she did not know the dose. She has continued to be on nebivolol 5 mg daily for blood pressure and palpitations. She is  on levothyroxine 125 mcg for hypothyroidism.   Since I last saw her in she had seen Dr. Christella Noa and had undergone laboratory she had undergone laboratory by Dr. Dagmar Hait in December 2021.  At that time her potassium was 5.7.  LDL cholesterol was 115 with total cholesterol 186 triglycerides 81 HDL 55.  TSH was significantly increased at 68.97.  She was found to have developed atrial fibs/flutter.  She was seen by Fabian Sharp in January and again in February 2022 at which time she was still felt to be in atrial fibs/flutter.  A 2D echo Doppler study from April 26, 2020 showed an EF of 60 to 65%.  There was mild concentric LVH.  Left atrial size with mild to moderately dilated.  The right atrium was mildly dilated.  There was mild to moderate mitral regurgitation and mild to moderate aortic sclerosis.  Carotid duplex imaging showed 1 to 39% narrowings bilaterally in her carotids with normal vertebral and subclavian flow.  When last seen by Fabian Sharp, she was uncertain as to if her thyroid had been corrected and as result deferred DC cardioversion until TSH was normalized.  It appears that over the past several months.  Levothyroxine dose has increased from 1.12  To 1.25 and most recently 1.37 mcg daily.  She tells me she had lab work done yesterday by Dr. Dagmar Hait.  She denies any chest pain.  She is unaware of her cardiac rhythm.  She denies shortness of breath, presyncope or syncope.  She presents for follow-up evaluation.  Past Medical History:  Diagnosis Date  . Anxiety   . Arthritis    Right shoulder arthritis  . Asthma   . Atrial fibrillation ([place])   . Carotid artery disease ([place])   . Chronic anticoagulation   . Diverticulitis   . Dysrhythmia   . Headache(784.0)   . Heart murmur   . Hypothyroidism   . Memory impairment   . Osteoarthritis of left shoulder 07/20/2013  . Peripheral vascular disease ([place])    carotid   . Primary osteoarthritis of right shoulder 11/16/2013  . Seasonal allergies      Past surgical history is notable for right carotid endarterectomy and recent bilateral cataract surgeries.  No Known Allergies  Current Outpatient Medications  Medication Sig Dispense Refill  . calcium carbonate (OS-CAL) 600 MG TABS tablet Take 600 mg by mouth daily with breakfast.     . cetirizine (ZYRTEC) 10 MG tablet Take 10 mg by mouth daily.    . cholecalciferol (VITAMIN D) 1000 UNITS tablet Take 2,000 Units by mouth daily.    Marland Kitchen ELIQUIS 5 MG TABS tablet Take 5 mg by mouth 2 (two) times daily.    . fluticasone (FLONASE) 50 MCG/ACT nasal spray Place 2 sprays into both nostrils daily.    Marland Kitchen guaiFENesin (MUCINEX) 600 MG 12 hr tablet Take 1,200 mg by mouth 2 (two) times daily as needed for to loosen phlegm.     Marland Kitchen ibuprofen (ADVIL,MOTRIN) 200 MG tablet Take 200 mg by mouth every 6 (six) hours as needed for headache (pain).    Marland Kitchen  Multiple Vitamin (MULTIVITAMIN WITH MINERALS) TABS tablet Take 1 tablet by mouth daily.    Marland Kitchen OVER THE COUNTER MEDICATION Place 1 drop into both eyes daily as needed (dry eyes). Over the counter lubricating eye drop    . OVER THE COUNTER MEDICATION Take 1 tablet by mouth every 4 (four) hours as needed (nasal drainage/ allergies). OTC allergy medication    . SYNTHROID 112 MCG tablet Take by mouth.    . nebivolol (BYSTOLIC) 5 MG tablet Take 1 tablet (5 mg total) by mouth daily. 90 tablet 3  . rosuvastatin (CRESTOR) 5 MG tablet Take 5 mg by mouth daily.     No current facility-administered medications for this visit.    Social History   Socioeconomic History  . Marital status: Married    Spouse name: Not on file  . Number of children: Not on file  . Years of education: Not on file  . Highest education level: Not on file  Occupational History  . Not on file  Tobacco Use  . Smoking status: Former Smoker    Packs/day: 0.25    Years: 2.00    Pack years: 0.50    Types: Cigarettes    Quit date: 07/13/1963    Years since quitting: 57.0  . Smokeless tobacco:  Never Used  . Tobacco comment: quit aprrox 50 years ago.  Vaping Use  . Vaping Use: Never used  Substance and Sexual Activity  . Alcohol use: No  . Drug use: No  . Sexual activity: Not on file  Other Topics Concern  . Not on file  Social History Narrative  . Not on file   Social Determinants of Health   Financial Resource Strain: Not on file  Food Insecurity: Not on file  Transportation Needs: Not on file  Physical Activity: Not on file  Stress: Not on file  Social Connections: Not on file  Intimate Partner Violence: Not on file    Family history is notable that both parents are deceased mother at age 77 with cancer father at age 27. Her brother is deceased also and had various cancers. His sister is also deceased. She is the mother of my patient Addison Bailey.  ROS General: Negative; No fevers, chills, or night sweats;  HEENT: Vision has significantly improved following cataract surgery No changes in vision or hearing, sinus congestion, difficulty swallowing Pulmonary: Negative; No cough, wheezing, shortness of breath, hemoptysis Cardiovascular: See HPI GI: Negative; No nausea, vomiting, diarrhea, or abdominal pain GU: Negative; No dysuria, hematuria, or difficulty voiding Musculoskeletal: Positive for osteopenia versus osteoporosis.  She is status post bilateral sequential shoulder replacement surgeries;  Hematologic/Oncology: Negative; no easy bruising, bleeding Endocrine: Positive for hypothyroidism Neuro: Negative; no changes in balance, headaches Skin: Negative; No rashes or skin lesions Psychiatric: Negative; No behavioral problems, depression Sleep: Negative; No snoring, daytime sleepiness, hypersomnolence, bruxism, restless legs, hypnogognic hallucinations, no cataplexy Other comprehensive 14 point system review is negative.   PE BP 120/70   Pulse 78   Ht 5' 7"  (1.702 m)   Wt 145 lb (65.8 kg)   SpO2 97%   BMI 22.71 kg/m    Repeat blood pressure by me was  122/70  Wt Readings from Last 3 Encounters:  07/13/20 145 lb (65.8 kg)  05/01/20 153 lb 6.4 oz (69.6 kg)  04/04/20 152 lb 9.6 oz (69.2 kg)   General: Alert, oriented, no distress.  Skin: normal turgor, no rashes, warm and dry HEENT: Normocephalic, atraumatic. Pupils equal round and reactive to  light; sclera anicteric; extraocular muscles intact;  Nose without nasal septal hypertrophy Mouth/Parynx benign; Mallinpatti scale 3 Neck: No JVD, no carotid bruits; normal carotid upstroke Lungs: clear to ausculatation and percussion; no wheezing or rales Chest wall: without tenderness to palpitation Heart: PMI not displaced, fairly regular rhythm with a rate in the upper 70s, s1 s2 normal, 2/6 systolic murmur in the aortic area no diastolic murmur, no rubs, gallops, thrills, or heaves Abdomen: soft, nontender; no hepatosplenomehaly, BS+; abdominal aorta nontender and not dilated by palpation. Back: no CVA tenderness Pulses 2+ Musculoskeletal: full range of motion, normal strength, no joint deformities Extremities: no clubbing cyanosis or edema, Homan's sign negative  Neurologic: grossly nonfocal; Cranial nerves grossly wnl Psychologic: Normal mood and affect  ECG (independently read by me): Atrilal flutter at 79, QTc 458 msec  June 24, 2019 ECG (independently read by me): Normal sinus rhythm at 72 bpm. First-degree AV block with a PR normal at 218 ms.  October 2018 ECG (independently read by me): Sinus bradycardia at 57 bpm.  First-degree AV block with a PR interval of 222 ms.  No significant ST-T changes.  September 2016 ECG (independently read by me):  Normal sinus rhythm at 67 bpm.  No ectopy.    Ffirst-degree AV block  PR interval at 206 ms.  ECG (independently read by me): Sinus rhythm with first-degree AV block.  Prior March 2015 ECG (independently read by me): Normal sinus rhythm with PACs and a transient trigeminal rhythm.  ECG: Sinus rhythm 84 beats per minute period. We'll 168  ms. QTc interval 470 ms. No significant ST-T changes.  LABS: BMP Latest Ref Rng & Units 10/09/2019 02/01/2016 11/18/2013  Glucose 70 - 99 mg/dL 109(H) 105(H) 110(H)  BUN 8 - 23 mg/dL 15 12 12   Creatinine 0.44 - 1.00 mg/dL 0.93 1.06(H) 0.81  Sodium 135 - 145 mmol/L 138 137 136(L)  Potassium 3.5 - 5.1 mmol/L 4.1 4.0 3.9  Chloride 98 - 111 mmol/L 104 106 100  CO2 22 - 32 mmol/L 21(L) 22 24  Calcium 8.9 - 10.3 mg/dL 9.6 10.0 8.9   Hepatic Function Latest Ref Rng & Units 10/09/2019 02/01/2016 11/18/2013  Total Protein 6.5 - 8.1 g/dL 8.3(H) 7.2 6.8  Albumin 3.5 - 5.0 g/dL 4.3 4.3 3.3(L)  AST 15 - 41 U/L 38 42(H) 29  ALT 0 - 44 U/L 26 27 15   Alk Phosphatase 38 - 126 U/L 102 81 81  Total Bilirubin 0.3 - 1.2 mg/dL 1.1 1.4(H) 1.6(H)   CBC Latest Ref Rng & Units 10/09/2019 02/01/2016 11/18/2013  WBC 4.0 - 10.5 K/uL 9.5 7.9 8.8  Hemoglobin 12.0 - 15.0 g/dL 16.3(H) 15.3(H) 13.1  Hematocrit 36.0 - 46.0 % 48.0(H) 44.0 38.7  Platelets 150 - 400 K/uL 184 192 172   Lab Results  Component Value Date   MCV 90.9 10/09/2019   MCV 88.5 02/01/2016   MCV 88.0 11/18/2013   Lipid Panel  No results found for: CHOL, TRIG, HDL, CHOLHDL, VLDL, LDLCALC, LDLDIRECT   RADIOLOGY: No results found.  IMPRESSION:  No diagnosis found.  ASSESSMENT AND PLAN: Ms. Jaelani Posa is a 87 year old female has a history of  hypertension, hyperlipidemia, tachypalpitations, and peripheral vascular disease. She is status post right carotid carotid endarterectomy in 2003. An echo Doppler study in 2014  showed normal systolic function. She did have aortic valve sclerosis without stenosis with trace aortic insufficiency. She also had mitral annular calcification with mild to moderate central regurgitation. There was grade 1 diastolic dysfunction.  She has been on Bystolic. An echo in 2018 continued to show aortic sclerosis with mild LVH and abnormal Doppler parameters consistent with elevated ventricular end-diastolic filling  pressure. There was aortic sclerosis with mild aortic insufficiency.  When I last saw her in April 2021 she was maintaining sinus rhythm and feeling well.  Apparently, she is recently since January been documented to be in atrial fibrillation/flutter.  Apparently her TSH level was significantly increased on laboratories in late December and she has had increasing doses of levothyroxine currently at 137 mcg daily.  She had lab work yesterday by Dr. Elsworth Soho, results pending.  She has been in atrial flutter most likely at least 4 months.  She is on Eliquis anticoagulation and denies bleeding.  Her ECG today shows atrial flutter with variable block versus coarse fibrillatory waves of A. fib.  With her pulse at 78, I am recommending slight titration of Bystolic from 5 mg daily to 7.5 mg daily.  She was also started on pantoprazole 40 mg yesterday by Dr. Elsworth Soho.  She continues to be on low-dose rosuvastatin 5 mg.  I reviewed her echo Doppler study as well as her recent carotid studies.  Hopefully the increased beta-blocker will be successful in improved rate control with possible cardio version pharmacologically.  I plan to see her back in the office in 3 to 4 weeks for follow-up evaluation.  If thyroid function is stable and she continues to be in atrial flutter/fibrillation, plans will be made for DC cardioversion.  [name], MD, [facility]  07/13/2020 2:54 PM

## 2020-07-14 ENCOUNTER — Encounter: Payer: Self-pay | Admitting: Cardiovascular Disease

## 2020-07-14 ENCOUNTER — Other Ambulatory Visit: Payer: Self-pay | Admitting: Cardiovascular Disease

## 2020-07-14 ENCOUNTER — Ambulatory Visit: Payer: Medicare Other | Admitting: Cardiovascular Disease

## 2020-07-17 ENCOUNTER — Other Ambulatory Visit: Payer: Self-pay

## 2020-07-17 ENCOUNTER — Ambulatory Visit
Admission: RE | Admit: 2020-07-17 | Discharge: 2020-07-17 | Disposition: A | Discharge status: Home / Self Care | Payer: Medicare Other | Source: Ambulatory Visit | Attending: Internal Medicine | Admitting: Internal Medicine

## 2020-07-17 DIAGNOSIS — Z1231 Encounter for screening mammogram for malignant neoplasm of breast: Secondary | ICD-10-CM

## 2020-07-18 ENCOUNTER — Telehealth: Payer: Self-pay | Admitting: Cardiovascular Disease

## 2020-07-18 DIAGNOSIS — Z961 Presence of intraocular lens: Secondary | ICD-10-CM | POA: Diagnosis not present

## 2020-07-18 DIAGNOSIS — H0102A Squamous blepharitis right eye, upper and lower eyelids: Secondary | ICD-10-CM | POA: Diagnosis not present

## 2020-07-18 DIAGNOSIS — H0102B Squamous blepharitis left eye, upper and lower eyelids: Secondary | ICD-10-CM | POA: Diagnosis not present

## 2020-07-18 DIAGNOSIS — H04123 Dry eye syndrome of bilateral lacrimal glands: Secondary | ICD-10-CM | POA: Diagnosis not present

## 2020-07-18 DIAGNOSIS — H1045 Other chronic allergic conjunctivitis: Secondary | ICD-10-CM | POA: Diagnosis not present

## 2020-07-18 NOTE — Telephone Encounter (Signed)
Pt c/o medication issue:  1. Name of Medication: nebivolol (BYSTOLIC) 5 MG tablet  2. How are you currently taking this medication (dosage and times per day)? 1 and a half tablets daily.  3. Are you having a reaction (difficulty breathing--STAT)? no  4. What is your medication issue? Patient's daughter states the medication was increased to 1 and a half tablets, 7.5 mg total. She states her insurance needs prior authorization for the increased dose.

## 2020-07-19 NOTE — Telephone Encounter (Addendum)
**Note De-Identified  Obfuscation** I started a urgent Nebivolol PA through covermymeds and received the following message: Danielle [name] Key: [id number] - PA Case ID: [id number] Outcome: Approved today Approved. This drug has been approved under the Member's Medicare Part D benefit. Approved quantity: 135 <> per 90 day(s). Drug: Nebivolol HCl 5MG  tablets Form: [employer] Electronic Prior Authorization Request Form (2017 NCPDP)  I have notified [facility] and the pts daughter [name] of this approval.

## 2020-08-14 ENCOUNTER — Encounter: Payer: Self-pay | Admitting: Cardiovascular Disease

## 2020-08-14 ENCOUNTER — Ambulatory Visit (INDEPENDENT_AMBULATORY_CARE_PROVIDER_SITE_OTHER): Payer: Medicare Other | Admitting: Cardiovascular Disease

## 2020-08-14 ENCOUNTER — Other Ambulatory Visit: Payer: Self-pay

## 2020-08-14 DIAGNOSIS — I480 Paroxysmal atrial fibrillation: Secondary | ICD-10-CM

## 2020-08-14 DIAGNOSIS — E039 Hypothyroidism, unspecified: Secondary | ICD-10-CM

## 2020-08-14 DIAGNOSIS — Z7901 Long term (current) use of anticoagulants: Secondary | ICD-10-CM

## 2020-08-14 DIAGNOSIS — I358 Other nonrheumatic aortic valve disorders: Secondary | ICD-10-CM | POA: Diagnosis not present

## 2020-08-14 DIAGNOSIS — I483 Typical atrial flutter: Secondary | ICD-10-CM | POA: Diagnosis not present

## 2020-08-14 DIAGNOSIS — I340 Nonrheumatic mitral (valve) insufficiency: Secondary | ICD-10-CM | POA: Diagnosis not present

## 2020-08-14 DIAGNOSIS — I6523 Occlusion and stenosis of bilateral carotid arteries: Secondary | ICD-10-CM

## 2020-08-14 MED ORDER — NEBIVOLOL HCL 10 MG PO TABS: 10.0000 mg | ORAL_TABLET | Freq: Every day | ORAL | 3 refills | Status: DC

## 2020-08-14 NOTE — Progress Notes (Signed)
Patient ID: Danielle [name], female   DOB: Jan 07, 1933, 87 y.o.   MRN: [id number]     HPI: Danielle [name] is a 87 y.o. female who presents to the office today for a 4 week f/u cardiology evaluation.  Danielle [name] has a history of peripheral vascular disease and is status post right carotid endarterectomy in 2005. She also has a history of hypothyroidism, hypertension, palpitations, hyperlipidemia and has intermittent seasonal allergies. In January 2011 Echo Doppler study showed normal systolic function with grade 1 diastolic dysfunction, mild MR and TR and mild aortic sclerosis. A nuclear perfusion study at that time revealed normal perfusion.  A carotid duplex study was in January 2012 was was mildly abnormal with irregular mixed density plaque but was without significant additional stenosis.  She sees Dr Dagmar Hait, for primary care. Laboratory in September 2013 showed a cholesterol of 136 triglyceride 64 HDL 50 LDL 73. Normal renal function. She was not anemic.  Danielle [name] underwent  bilateral cataract surgery last summer by Dr. Prudencio Burly at [facility]. She tolerated surgery well from a cardiovascular standpoint. She denies recent leg swelling although she does have a prescription for Lasix. She is tolerating her Bystolic and denies any recent palpitations.   An echo Doppler study in October 2014 revealed normal systolic function with an ejection fraction of 60-65%.  She did not have regional wall motion abnormalities.  There is grade 1 diastolic dysfunction.  There was evidence for aortic valve sclerosis without stenosis, mild to moderate mitral regurgitation, and mild tricuspid regurgitation.  She had mild pulmonary hypertension with an estimated RV systolic pressure at 33 mm.  Prior to planned left shoulder replacement in May 2015, she underwent a preoperative nuclear perfusion study  in April 2015.  This was low risk and showed some basal septal, artifact without scar or ischemia.  Ms.  Danielle [name] underwent left shoulder surgery and on 07/20/2013 and on 11/16/2013 underwent right shoulder replacement surgery by Dr. [name].  She did have some confusion, which may be medication related.  Following her second surgery.  She tolerated both of these were cardiovascular standpoint.  When I last saw her in 2018 after not having seen her in over 2 years she was without episodes of chest pain.  She had seen Dr. Dagmar Hait.  Laboratory was performed.  She was found to have an over suppressed thyroid with a TSH of 0.09 and her Synthroid dose was reduced from 137 g to 125 g.  In addition, her LDL was increased at 121 and apparently she was just started on a prescription of very low-dose rosuvastatin at 5 mg.  She tells me that last week she tripped and fell backwards and hit her head.  She underwent an CT evaluation on 12/15/2016, which did not show any acute traumatic finding.  There was ordinary.  Generalized atrophy with mild chronic small vessel changes of the white matter.  She is unaware of palpitations.   I recommended that he undergo a follow-up echo Doppler study which was done on January 19, 2017. This revealed normal LV function with EF 55 to 60%. There was abnormal tissue Doppler. There was evidence for aortic sclerosis without stenosis with mild aortic insufficiency.  She underwent follow-up carotid studies in December 2019 which showed mild 1 to 39% stenoses in the right and left ICA. There was nonsignificant plaque in the common carotid artery on the left. She had normal flow dynamics in the vertebral and subclavian vessels.   I  saw  her in April 2021 after not having seen her since October 2018.  At that time she denied any episodes of chest pain and was continued to see Dr. Elsworth Soho who was checking her laboratory.   She states that she is now on rosuvastatin but she did not know the dose. She has continued to be on nebivolol 5 mg daily for blood pressure and palpitations. She is on levothyroxine 125  mcg for hypothyroidism.   Since I last saw her in she had seen Dr. Christella Noa and had undergone laboratory she had undergone laboratory by Dr. Dagmar Hait in December 2021.  At that time her potassium was 5.7.  LDL cholesterol was 115 with total cholesterol 186 triglycerides 81 HDL 55.  TSH was significantly increased at 68.97.  She was found to have developed atrial fibs/flutter.  She was seen by Fabian Sharp in January and again in February 2022 at which time she was still felt to be in atrial fibs/flutter.  A 2D echo Doppler study from April 26, 2020 showed an EF of 60 to 65%.  There was mild concentric LVH.  Left atrial size with mild to moderately dilated.  The right atrium was mildly dilated.  There was mild to moderate mitral regurgitation and mild to moderate aortic sclerosis.  Carotid duplex imaging showed 1 to 39% narrowings bilaterally in her carotids with normal vertebral and subclavian flow.  When last seen by Fabian Sharp, she was uncertain as to if her thyroid had been corrected and as result deferred DC cardioversion until TSH was normalized.   I saw her in follow-up of Fabian Sharp on Jul 13, 2020.  He did.  That over the prior several months or levothyroxine dose was increased from 1.12  To 1.25 and most recently 1.37 mcg daily.  She tells me she had lab work done yesterday by Dr. Dagmar Hait.  When I saw her she denied any chest pain and was unaware of any cardiac rhythm issues.  She denied shortness of breath, presyncope or syncope.  During that evaluation, her EKG continued to show atrial flutter with variable block versus coarse atrial fibrillation.  Her ventricular rate was 78 bpm and I recommended slight titration of Bystolic from 5 mg up to 7.5 mg daily.  She also had recently been started on pantoprazole by Dr. Elsworth Soho.  Since I last saw her, she has felt well.  Her dose of levothyroxine has further been reduced back to 1.25 mcg daily.  She is unaware of any heart rate irregularity.  She denies shortness  of breath or chest pain.  She remains on Eliquis 5 mg twice a day.  She is on rosuvastatin 5 mg.  Past Medical History:  Diagnosis Date   Anxiety    Arthritis    Right shoulder arthritis   Asthma    Atrial fibrillation ([place])    Carotid artery disease (HCC)    Chronic anticoagulation    Diverticulitis    Dysrhythmia    Headache(784.0)    Heart murmur    Hypothyroidism    Memory impairment    Osteoarthritis of left shoulder 07/20/2013   Peripheral vascular disease ([place])    carotid    Primary osteoarthritis of right shoulder 11/16/2013   Seasonal allergies     Past surgical history is notable for right carotid endarterectomy and recent bilateral cataract surgeries.  No Known Allergies  Current Outpatient Medications  Medication Sig Dispense Refill   acetaminophen (TYLENOL) 500 MG tablet Take 500 mg by mouth every  6 (six) hours as needed.     calcium carbonate (OS-CAL) 600 MG TABS tablet Take 600 mg by mouth daily with breakfast.      cetirizine (ZYRTEC) 10 MG tablet Take 10 mg by mouth daily.     cholecalciferol (VITAMIN D) 1000 UNITS tablet Take 2,000 Units by mouth daily.     ELIQUIS 5 MG TABS tablet Take 5 mg by mouth 2 (two) times daily.     fluticasone (FLONASE) 50 MCG/ACT nasal spray Place 2 sprays into both nostrils daily.     guaiFENesin (MUCINEX) 600 MG 12 hr tablet Take 1,200 mg by mouth 2 (two) times daily as needed for to loosen phlegm.      levothyroxine (SYNTHROID) 125 MCG tablet Take 125 mcg by mouth.     Multiple Vitamin (MULTIVITAMIN WITH MINERALS) TABS tablet Take 1 tablet by mouth daily.     OVER THE COUNTER MEDICATION Place 1 drop into both eyes daily as needed (dry eyes). Over the counter lubricating eye drop     OVER THE COUNTER MEDICATION Take 1 tablet by mouth every 4 (four) hours as needed (nasal drainage/ allergies). OTC allergy medication     pantoprazole (PROTONIX) 40 MG tablet Take 40 mg by mouth daily. Take 1 tablet by mouth everyday for 90 days.      pseudoephedrine-guaifenesin (MUCINEX D) 60-600 MG 12 hr tablet      rosuvastatin (CRESTOR) 5 MG tablet Take 5 mg by mouth daily.     nebivolol (BYSTOLIC) 10 MG tablet Take 1 tablet (10 mg total) by mouth daily. 90 tablet 3   No current facility-administered medications for this visit.    Social History   Socioeconomic History   Marital status: Married    Spouse name: Not on file   Number of children: Not on file   Years of education: Not on file   Highest education level: Not on file  Occupational History   Not on file  Tobacco Use   Smoking status: Former    Packs/day: 0.25    Years: 2.00    Pack years: 0.50    Types: Cigarettes    Quit date: 07/13/1963    Years since quitting: 57.1   Smokeless tobacco: Never   Tobacco comments:    quit aprrox 50 years ago.  Vaping Use   Vaping Use: Never used  Substance and Sexual Activity   Alcohol use: No   Drug use: No   Sexual activity: Not on file  Other Topics Concern   Not on file  Social History Narrative   Not on file   Social Determinants of Health   Financial Resource Strain: Not on file  Food Insecurity: Not on file  Transportation Needs: Not on file  Physical Activity: Not on file  Stress: Not on file  Social Connections: Not on file  Intimate Partner Violence: Not on file    Family history is notable that both parents are deceased mother at age 97 with cancer father at age 60. Her brother is deceased also and had various cancers. His sister is also deceased. She is the mother of my patient Addison Bailey.  ROS General: Negative; No fevers, chills, or night sweats;  HEENT: Vision has significantly improved following cataract surgery No changes in vision or hearing, sinus congestion, difficulty swallowing Pulmonary: Negative; No cough, wheezing, shortness of breath, hemoptysis Cardiovascular: See HPI GI: Negative; No nausea, vomiting, diarrhea, or abdominal pain GU: Negative; No dysuria, hematuria, or difficulty  voiding Musculoskeletal: Positive  for osteopenia versus osteoporosis.  She is status post bilateral sequential shoulder replacement surgeries;  Hematologic/Oncology: Negative; no easy bruising, bleeding Endocrine: Positive for hypothyroidism Neuro: Negative; no changes in balance, headaches Skin: Negative; No rashes or skin lesions Psychiatric: Negative; No behavioral problems, depression Sleep: Negative; No snoring, daytime sleepiness, hypersomnolence, bruxism, restless legs, hypnogognic hallucinations, no cataplexy Other comprehensive 14 point system review is negative.   PE BP 132/82 (BP Location: Left Arm, Patient Position: Sitting, Cuff Size: Normal)   Pulse (!) 104   Ht _0  (1.676 m)   Wt 143 lb (64.9 kg)   SpO2 98%   BMI 23.08 kg/m    Repeat blood pressure by me was 120/78, pulse 104.  Wt Readings from Last 3 Encounters:  08/14/20 143 lb (64.9 kg)  07/13/20 145 lb (65.8 kg)  05/01/20 153 lb 6.4 oz (69.6 kg)   General: Alert, oriented, no distress.  Skin: normal turgor, no rashes, warm and dry HEENT: Normocephalic, atraumatic. Pupils equal round and reactive to light; sclera anicteric; extraocular muscles intact;  Nose without nasal septal hypertrophy Mouth/Parynx benign; Mallinpatti scale 3 Neck: No JVD, no carotid bruits; normal carotid upstroke Lungs: clear to ausculatation and percussion; no wheezing or rales Chest wall: without tenderness to palpitation Heart: PMI not displaced, RRR, s1 s2 normal, 2/6 systolic murmur, no diastolic murmur, no rubs, gallops, thrills, or heaves Abdomen: soft, nontender; no hepatosplenomehaly, BS+; abdominal aorta nontender and not dilated by palpation. Back: no CVA tenderness Pulses 2+ Musculoskeletal: full range of motion, normal strength, no joint deformities Extremities: no clubbing cyanosis or edema, Homan's sign negative  Neurologic: grossly nonfocal; Cranial nerves grossly wnl Psychologic: Normal mood and  affect    General: Alert, oriented, no distress.  Skin: normal turgor, no rashes, warm and dry HEENT: Normocephalic, atraumatic. Pupils equal round and reactive to light; sclera anicteric; extraocular muscles intact;  Nose without nasal septal hypertrophy Mouth/Parynx benign; Mallinpatti scale 3 Neck: No JVD, no carotid bruits; normal carotid upstroke Lungs: clear to ausculatation and percussion; no wheezing or rales Chest wall: without tenderness to palpitation Heart: PMI not displaced, fairly regular rhythm with a rate in the upper 70s, s1 s2 normal, 2/6 systolic murmur in the aortic area no diastolic murmur, no rubs, gallops, thrills, or heaves Abdomen: soft, nontender; no hepatosplenomehaly, BS+; abdominal aorta nontender and not dilated by palpation. Back: no CVA tenderness Pulses 2+ Musculoskeletal: full range of motion, normal strength, no joint deformities Extremities: no clubbing cyanosis or edema, Homan's sign negative  Neurologic: grossly nonfocal; Cranial nerves grossly wnl Psychologic: Normal mood and affect  ECG (independently read by me): Atrial flutter at 91, QTc 322  Jul 13, 2020 ECG (independently read by me): Atrilal flutter at 79, QTc 458 msec  June 24, 2019 ECG (independently read by me): Normal sinus rhythm at 72 bpm. First-degree AV block with a PR normal at 218 ms.  October 2018 ECG (independently read by me): Sinus bradycardia at 57 bpm.  First-degree AV block with a PR interval of 222 ms.  No significant ST-T changes.  September 2016 ECG (independently read by me):  Normal sinus rhythm at 67 bpm.  No ectopy.    Ffirst-degree AV block  PR interval at 206 ms.  ECG (independently read by me): Sinus rhythm with first-degree AV block.  Prior March 2015 ECG (independently read by me): Normal sinus rhythm with PACs and a transient trigeminal rhythm.  ECG: Sinus rhythm 84 beats per minute period. We'll 168 ms. QTc interval 470  ms. No significant ST-T  changes.  LABS: BMP Latest Ref Rng & Units 10/09/2019 02/01/2016 11/18/2013  Glucose 70 - 99 mg/dL 109(H) 105(H) 110(H)  BUN 8 - 23 mg/dL _0 Creatinine 0.44 - 1.00 mg/dL 0.93 1.06(H) 0.81  Sodium 135 - 145 mmol/L 138 137 136(L)  Potassium 3.5 - 5.1 mmol/L 4.1 4.0 3.9  Chloride 98 - 111 mmol/L 104 106 100  CO2 22 - 32 mmol/L 21(L) 22 24  Calcium 8.9 - 10.3 mg/dL 9.6 10.0 8.9   Hepatic Function Latest Ref Rng & Units 10/09/2019 02/01/2016 11/18/2013  Total Protein 6.5 - 8.1 g/dL 8.3(H) 7.2 6.8  Albumin 3.5 - 5.0 g/dL 4.3 4.3 3.3(L)  AST 15 - 41 U/L 38 42(H) 29  ALT 0 - 44 U/L _1 Alk Phosphatase 38 - 126 U/L 102 81 81  Total Bilirubin 0.3 - 1.2 mg/dL 1.1 1.4(H) 1.6(H)   CBC Latest Ref Rng & Units 10/09/2019 02/01/2016 11/18/2013  WBC 4.0 - 10.5 K/uL 9.5 7.9 8.8  Hemoglobin 12.0 - 15.0 g/dL 16.3(H) 15.3(H) 13.1  Hematocrit 36.0 - 46.0 % 48.0(H) 44.0 38.7  Platelets 150 - 400 K/uL 184 192 172   Lab Results  Component Value Date   MCV 90.9 10/09/2019   MCV 88.5 02/01/2016   MCV 88.0 11/18/2013   Lipid Panel  No results found for: CHOL, TRIG, HDL, CHOLHDL, VLDL, LDLCALC, LDLDIRECT   RADIOLOGY: No results found.   CV STUDIES:    ECHO 04/26/2020 IMPRESSIONS   1. Left ventricular ejection fraction, by estimation, is 60 to 65%. The  left ventricle has normal function. The left ventricle has no regional  wall motion abnormalities. There is mild concentric left ventricular  hypertrophy. Left ventricular diastolic  function could not be evaluated.   2. Right ventricular systolic function is normal. The right ventricular  size is normal. There is normal pulmonary artery systolic pressure.   3. Left atrial size was mild to moderately dilated.   4. Right atrial size was mildly dilated.   5. The mitral valve is degenerative. Mild to moderate mitral valve  regurgitation. Moderate mitral annular calcification.   6. Tricuspid valve regurgitation is mild to moderate.   7.  The aortic valve is tricuspid. There is mild calcification of the  aortic valve. There is mild thickening of the aortic valve. Aortic valve  regurgitation is mild. Mild to moderate aortic valve  sclerosis/calcification is present, without any evidence  of aortic stenosis.   8. The inferior vena cava is normal in size with <50% respiratory  variability, suggesting right atrial pressure of 8 mmHg.   Comparison(s): No significant change from prior study.   IMPRESSION:  1. Typical atrial flutter with variable block ([place])   2. Hypothyroidism, unspecified type   3. Chronic anticoagulation   4. Aortic valve sclerosis   5. Mitral valve insufficiency, unspecified etiology     ASSESSMENT AND PLAN: Ms. Sandy Haye is a 87 year old female has a history of  hypertension, hyperlipidemia, tachypalpitations, and peripheral vascular disease. She is status post right carotid carotid endarterectomy in 2003. An echo Doppler study in 2014  showed normal systolic function, aortic valve sclerosis without stenosis with trace aortic insufficiency. She also had mitral annular calcification with mild to moderate central regurgitation. There was grade 1 diastolic dysfunction.  She has been on Bystolic. An echo in 2018 continued to show aortic sclerosis with mild LVH and abnormal Doppler parameters consistent with elevated ventricular end-diastolic filling pressure. There  was aortic sclerosis with mild aortic insufficiency.  When I saw her in April 2021 she was maintaining sinus rhythm and feeling well.  Since January 2022 she has been documented to be in atrial fibrillation/flutter.  Apparently her TSH level was significantly increased on laboratories in late December and she has had increasing doses of levothyroxine titrated up to 137 mcg daily.  Most recently this dose has been reduced back down to 0.125 mcg by Dr. Dagmar Hait who has been following her laboratory.   She now has been in atrial flutter for approximately 6  months.  She has been anticoagulated.  Today she continues to show atrial flutter with variable block with a ventricular rate at 104 bpm.  I am recommending further titration of Bystolic to 10 mg.  I discussed attempt at restoration of sinus rhythm and will schedule her for DC cardioversion.  Patient and daughter would like me to do the cardioversion procedure and as result I will schedule this to be done on my rounding week when I am in the hospital and she has been tentatively scheduled for this procedure to be done on June 21 at 10:30 AM.  I discussed the risk benefits of the procedure.  Repeat laboratory will need to be obtained and I will recheck a comprehensive metabolic panel, magnesium, CBC and TSH.   [name], MD, [facility]  08/20/2020 3:08 PM

## 2020-08-14 NOTE — H&P (View-Only) (Signed)
Patient ID: Danielle [name], female   DOB: 1933-02-16, 87 y.o.   MRN: [id number]     HPI: Danielle [name] is a 87 y.o. female who presents to the office today for a 4 week f/u cardiology evaluation.  Danielle [name] has a history of peripheral vascular disease and is status post right carotid endarterectomy in 2005. She also has a history of hypothyroidism, hypertension, palpitations, hyperlipidemia and has intermittent seasonal allergies. In January 2011 Echo Doppler study showed normal systolic function with grade 1 diastolic dysfunction, mild MR and TR and mild aortic sclerosis. A nuclear perfusion study at that time revealed normal perfusion.  A carotid duplex study was in January 2012 was was mildly abnormal with irregular mixed density plaque but was without significant additional stenosis.  She sees Dr Dagmar Hait, for primary care. Laboratory in September 2013 showed a cholesterol of 136 triglyceride 64 HDL 50 LDL 73. Normal renal function. She was not anemic.  Danielle [name] underwent  bilateral cataract surgery last summer by Dr. Prudencio Burly at [facility]. She tolerated surgery well from a cardiovascular standpoint. She denies recent leg swelling although she does have a prescription for Lasix. She is tolerating her Bystolic and denies any recent palpitations.   An echo Doppler study in October 2014 revealed normal systolic function with an ejection fraction of 60-65%.  She did not have regional wall motion abnormalities.  There is grade 1 diastolic dysfunction.  There was evidence for aortic valve sclerosis without stenosis, mild to moderate mitral regurgitation, and mild tricuspid regurgitation.  She had mild pulmonary hypertension with an estimated RV systolic pressure at 33 mm.  Prior to planned left shoulder replacement in May 2015, she underwent a preoperative nuclear perfusion study  in April 2015.  This was low risk and showed some basal septal, artifact without scar or ischemia.  [name] underwent left shoulder surgery and on 07/20/2013 and on 11/16/2013 underwent right shoulder replacement surgery by Dr. [name].  She did have some confusion, which may be medication related.  Following her second surgery.  She tolerated both of these were cardiovascular standpoint.  When I last saw her in 2018 after not having seen her in over 2 years she was without episodes of chest pain.  She had seen Dr. Dagmar Hait.  Laboratory was performed.  She was found to have an over suppressed thyroid with a TSH of 0.09 and her Synthroid dose was reduced from 137 g to 125 g.  In addition, her LDL was increased at 121 and apparently she was just started on a prescription of very low-dose rosuvastatin at 5 mg.  She tells me that last week she tripped and fell backwards and hit her head.  She underwent an CT evaluation on 12/15/2016, which did not show any acute traumatic finding.  There was ordinary.  Generalized atrophy with mild chronic small vessel changes of the white matter.  She is unaware of palpitations.   I recommended that he undergo a follow-up echo Doppler study which was done on January 19, 2017. This revealed normal LV function with EF 55 to 60%. There was abnormal tissue Doppler. There was evidence for aortic sclerosis without stenosis with mild aortic insufficiency.  She underwent follow-up carotid studies in December 2019 which showed mild 1 to 39% stenoses in the right and left ICA. There was nonsignificant plaque in the common carotid artery on the left. She had normal flow dynamics in the vertebral and subclavian vessels.   I  saw  her in April 2021 after not having seen her since October 2018.  At that time she denied any episodes of chest pain and was continued to see Dr. Elsworth Soho who was checking her laboratory.   She states that she is now on rosuvastatin but she did not know the dose. She has continued to be on nebivolol 5 mg daily for blood pressure and palpitations. She is on levothyroxine 125  mcg for hypothyroidism.   Since I last saw her in she had seen Dr. Christella Noa and had undergone laboratory she had undergone laboratory by Dr. Dagmar Hait in December 2021.  At that time her potassium was 5.7.  LDL cholesterol was 115 with total cholesterol 186 triglycerides 81 HDL 55.  TSH was significantly increased at 68.97.  She was found to have developed atrial fibs/flutter.  She was seen by Fabian Sharp in January and again in February 2022 at which time she was still felt to be in atrial fibs/flutter.  A 2D echo Doppler study from April 26, 2020 showed an EF of 60 to 65%.  There was mild concentric LVH.  Left atrial size with mild to moderately dilated.  The right atrium was mildly dilated.  There was mild to moderate mitral regurgitation and mild to moderate aortic sclerosis.  Carotid duplex imaging showed 1 to 39% narrowings bilaterally in her carotids with normal vertebral and subclavian flow.  When last seen by Fabian Sharp, she was uncertain as to if her thyroid had been corrected and as result deferred DC cardioversion until TSH was normalized.   I saw her in follow-up of Fabian Sharp on Jul 13, 2020.  He did.  That over the prior several months or levothyroxine dose was increased from 1.12  To 1.25 and most recently 1.37 mcg daily.  She tells me she had lab work done yesterday by Dr. Dagmar Hait.  When I saw her she denied any chest pain and was unaware of any cardiac rhythm issues.  She denied shortness of breath, presyncope or syncope.  During that evaluation, her EKG continued to show atrial flutter with variable block versus coarse atrial fibrillation.  Her ventricular rate was 78 bpm and I recommended slight titration of Bystolic from 5 mg up to 7.5 mg daily.  She also had recently been started on pantoprazole by Dr. Elsworth Soho.  Since I last saw her, she has felt well.  Her dose of levothyroxine has further been reduced back to 1.25 mcg daily.  She is unaware of any heart rate irregularity.  She denies shortness  of breath or chest pain.  She remains on Eliquis 5 mg twice a day.  She is on rosuvastatin 5 mg.  Past Medical History:  Diagnosis Date   Anxiety    Arthritis    Right shoulder arthritis   Asthma    Atrial fibrillation ([place])    Carotid artery disease (HCC)    Chronic anticoagulation    Diverticulitis    Dysrhythmia    Headache(784.0)    Heart murmur    Hypothyroidism    Memory impairment    Osteoarthritis of left shoulder 07/20/2013   Peripheral vascular disease ([place])    carotid    Primary osteoarthritis of right shoulder 11/16/2013   Seasonal allergies     Past surgical history is notable for right carotid endarterectomy and recent bilateral cataract surgeries.  No Known Allergies  Current Outpatient Medications  Medication Sig Dispense Refill   acetaminophen (TYLENOL) 500 MG tablet Take 500 mg by mouth every  6 (six) hours as needed.     calcium carbonate (OS-CAL) 600 MG TABS tablet Take 600 mg by mouth daily with breakfast.      cetirizine (ZYRTEC) 10 MG tablet Take 10 mg by mouth daily.     cholecalciferol (VITAMIN D) 1000 UNITS tablet Take 2,000 Units by mouth daily.     ELIQUIS 5 MG TABS tablet Take 5 mg by mouth 2 (two) times daily.     fluticasone (FLONASE) 50 MCG/ACT nasal spray Place 2 sprays into both nostrils daily.     guaiFENesin (MUCINEX) 600 MG 12 hr tablet Take 1,200 mg by mouth 2 (two) times daily as needed for to loosen phlegm.      levothyroxine (SYNTHROID) 125 MCG tablet Take 125 mcg by mouth.     Multiple Vitamin (MULTIVITAMIN WITH MINERALS) TABS tablet Take 1 tablet by mouth daily.     OVER THE COUNTER MEDICATION Place 1 drop into both eyes daily as needed (dry eyes). Over the counter lubricating eye drop     OVER THE COUNTER MEDICATION Take 1 tablet by mouth every 4 (four) hours as needed (nasal drainage/ allergies). OTC allergy medication     pantoprazole (PROTONIX) 40 MG tablet Take 40 mg by mouth daily. Take 1 tablet by mouth everyday for 90 days.      pseudoephedrine-guaifenesin (MUCINEX D) 60-600 MG 12 hr tablet      rosuvastatin (CRESTOR) 5 MG tablet Take 5 mg by mouth daily.     nebivolol (BYSTOLIC) 10 MG tablet Take 1 tablet (10 mg total) by mouth daily. 90 tablet 3   No current facility-administered medications for this visit.    Social History   Socioeconomic History   Marital status: Married    Spouse name: Not on file   Number of children: Not on file   Years of education: Not on file   Highest education level: Not on file  Occupational History   Not on file  Tobacco Use   Smoking status: Former    Packs/day: 0.25    Years: 2.00    Pack years: 0.50    Types: Cigarettes    Quit date: 07/13/1963    Years since quitting: 57.1   Smokeless tobacco: Never   Tobacco comments:    quit aprrox 50 years ago.  Vaping Use   Vaping Use: Never used  Substance and Sexual Activity   Alcohol use: No   Drug use: No   Sexual activity: Not on file  Other Topics Concern   Not on file  Social History Narrative   Not on file   Social Determinants of Health   Financial Resource Strain: Not on file  Food Insecurity: Not on file  Transportation Needs: Not on file  Physical Activity: Not on file  Stress: Not on file  Social Connections: Not on file  Intimate Partner Violence: Not on file    Family history is notable that both parents are deceased mother at age 97 with cancer father at age 60. Her brother is deceased also and had various cancers. His sister is also deceased. She is the mother of my patient Danielle [name].  ROS General: Negative; No fevers, chills, or night sweats;  HEENT: Vision has significantly improved following cataract surgery No changes in vision or hearing, sinus congestion, difficulty swallowing Pulmonary: Negative; No cough, wheezing, shortness of breath, hemoptysis Cardiovascular: See HPI GI: Negative; No nausea, vomiting, diarrhea, or abdominal pain GU: Negative; No dysuria, hematuria, or difficulty  voiding Musculoskeletal: Positive  for osteopenia versus osteoporosis.  She is status post bilateral sequential shoulder replacement surgeries;  Hematologic/Oncology: Negative; no easy bruising, bleeding Endocrine: Positive for hypothyroidism Neuro: Negative; no changes in balance, headaches Skin: Negative; No rashes or skin lesions Psychiatric: Negative; No behavioral problems, depression Sleep: Negative; No snoring, daytime sleepiness, hypersomnolence, bruxism, restless legs, hypnogognic hallucinations, no cataplexy Other comprehensive 14 point system review is negative.   PE BP 132/82 (BP Location: Left Arm, Patient Position: Sitting, Cuff Size: Normal)   Pulse (!) 104   Ht _0  (1.676 m)   Wt 143 lb (64.9 kg)   SpO2 98%   BMI 23.08 kg/m    Repeat blood pressure by me was 120/78, pulse 104.  Wt Readings from Last 3 Encounters:  08/14/20 143 lb (64.9 kg)  07/13/20 145 lb (65.8 kg)  05/01/20 153 lb 6.4 oz (69.6 kg)   General: Alert, oriented, no distress.  Skin: normal turgor, no rashes, warm and dry HEENT: Normocephalic, atraumatic. Pupils equal round and reactive to light; sclera anicteric; extraocular muscles intact;  Nose without nasal septal hypertrophy Mouth/Parynx benign; Mallinpatti scale 3 Neck: No JVD, no carotid bruits; normal carotid upstroke Lungs: clear to ausculatation and percussion; no wheezing or rales Chest wall: without tenderness to palpitation Heart: PMI not displaced, RRR, s1 s2 normal, 2/6 systolic murmur, no diastolic murmur, no rubs, gallops, thrills, or heaves Abdomen: soft, nontender; no hepatosplenomehaly, BS+; abdominal aorta nontender and not dilated by palpation. Back: no CVA tenderness Pulses 2+ Musculoskeletal: full range of motion, normal strength, no joint deformities Extremities: no clubbing cyanosis or edema, Homan's sign negative  Neurologic: grossly nonfocal; Cranial nerves grossly wnl Psychologic: Normal mood and  affect    General: Alert, oriented, no distress.  Skin: normal turgor, no rashes, warm and dry HEENT: Normocephalic, atraumatic. Pupils equal round and reactive to light; sclera anicteric; extraocular muscles intact;  Nose without nasal septal hypertrophy Mouth/Parynx benign; Mallinpatti scale 3 Neck: No JVD, no carotid bruits; normal carotid upstroke Lungs: clear to ausculatation and percussion; no wheezing or rales Chest wall: without tenderness to palpitation Heart: PMI not displaced, fairly regular rhythm with a rate in the upper 70s, s1 s2 normal, 2/6 systolic murmur in the aortic area no diastolic murmur, no rubs, gallops, thrills, or heaves Abdomen: soft, nontender; no hepatosplenomehaly, BS+; abdominal aorta nontender and not dilated by palpation. Back: no CVA tenderness Pulses 2+ Musculoskeletal: full range of motion, normal strength, no joint deformities Extremities: no clubbing cyanosis or edema, Homan's sign negative  Neurologic: grossly nonfocal; Cranial nerves grossly wnl Psychologic: Normal mood and affect  ECG (independently read by me): Atrial flutter at 91, QTc 322  Jul 13, 2020 ECG (independently read by me): Atrilal flutter at 79, QTc 458 msec  June 24, 2019 ECG (independently read by me): Normal sinus rhythm at 72 bpm. First-degree AV block with a PR normal at 218 ms.  October 2018 ECG (independently read by me): Sinus bradycardia at 57 bpm.  First-degree AV block with a PR interval of 222 ms.  No significant ST-T changes.  September 2016 ECG (independently read by me):  Normal sinus rhythm at 67 bpm.  No ectopy.    Ffirst-degree AV block  PR interval at 206 ms.  ECG (independently read by me): Sinus rhythm with first-degree AV block.  Prior March 2015 ECG (independently read by me): Normal sinus rhythm with PACs and a transient trigeminal rhythm.  ECG: Sinus rhythm 84 beats per minute period. We'll 168 ms. QTc interval 470  ms. No significant ST-T  changes.  LABS: BMP Latest Ref Rng & Units 10/09/2019 02/01/2016 11/18/2013  Glucose 70 - 99 mg/dL 109(H) 105(H) 110(H)  BUN 8 - 23 mg/dL _0 Creatinine 0.44 - 1.00 mg/dL 0.93 1.06(H) 0.81  Sodium 135 - 145 mmol/L 138 137 136(L)  Potassium 3.5 - 5.1 mmol/L 4.1 4.0 3.9  Chloride 98 - 111 mmol/L 104 106 100  CO2 22 - 32 mmol/L 21(L) 22 24  Calcium 8.9 - 10.3 mg/dL 9.6 10.0 8.9   Hepatic Function Latest Ref Rng & Units 10/09/2019 02/01/2016 11/18/2013  Total Protein 6.5 - 8.1 g/dL 8.3(H) 7.2 6.8  Albumin 3.5 - 5.0 g/dL 4.3 4.3 3.3(L)  AST 15 - 41 U/L 38 42(H) 29  ALT 0 - 44 U/L _1 Alk Phosphatase 38 - 126 U/L 102 81 81  Total Bilirubin 0.3 - 1.2 mg/dL 1.1 1.4(H) 1.6(H)   CBC Latest Ref Rng & Units 10/09/2019 02/01/2016 11/18/2013  WBC 4.0 - 10.5 K/uL 9.5 7.9 8.8  Hemoglobin 12.0 - 15.0 g/dL 16.3(H) 15.3(H) 13.1  Hematocrit 36.0 - 46.0 % 48.0(H) 44.0 38.7  Platelets 150 - 400 K/uL 184 192 172   Lab Results  Component Value Date   MCV 90.9 10/09/2019   MCV 88.5 02/01/2016   MCV 88.0 11/18/2013   Lipid Panel  No results found for: CHOL, TRIG, HDL, CHOLHDL, VLDL, LDLCALC, LDLDIRECT   RADIOLOGY: No results found.   CV STUDIES:    ECHO 04/26/2020 IMPRESSIONS   1. Left ventricular ejection fraction, by estimation, is 60 to 65%. The  left ventricle has normal function. The left ventricle has no regional  wall motion abnormalities. There is mild concentric left ventricular  hypertrophy. Left ventricular diastolic  function could not be evaluated.   2. Right ventricular systolic function is normal. The right ventricular  size is normal. There is normal pulmonary artery systolic pressure.   3. Left atrial size was mild to moderately dilated.   4. Right atrial size was mildly dilated.   5. The mitral valve is degenerative. Mild to moderate mitral valve  regurgitation. Moderate mitral annular calcification.   6. Tricuspid valve regurgitation is mild to moderate.   7.  The aortic valve is tricuspid. There is mild calcification of the  aortic valve. There is mild thickening of the aortic valve. Aortic valve  regurgitation is mild. Mild to moderate aortic valve  sclerosis/calcification is present, without any evidence  of aortic stenosis.   8. The inferior vena cava is normal in size with <50% respiratory  variability, suggesting right atrial pressure of 8 mmHg.   Comparison(s): No significant change from prior study.   IMPRESSION:  1. Typical atrial flutter with variable block ([place])   2. Hypothyroidism, unspecified type   3. Chronic anticoagulation   4. Aortic valve sclerosis   5. Mitral valve insufficiency, unspecified etiology     ASSESSMENT AND PLAN: Danielle [name] is a 87 year old female has a history of  hypertension, hyperlipidemia, tachypalpitations, and peripheral vascular disease. She is status post right carotid carotid endarterectomy in 2003. An echo Doppler study in 2014  showed normal systolic function, aortic valve sclerosis without stenosis with trace aortic insufficiency. She also had mitral annular calcification with mild to moderate central regurgitation. There was grade 1 diastolic dysfunction.  She has been on Bystolic. An echo in 2018 continued to show aortic sclerosis with mild LVH and abnormal Doppler parameters consistent with elevated ventricular end-diastolic filling pressure. There  was aortic sclerosis with mild aortic insufficiency.  When I saw her in April 2021 she was maintaining sinus rhythm and feeling well.  Since January 2022 she has been documented to be in atrial fibrillation/flutter.  Apparently her TSH level was significantly increased on laboratories in late December and she has had increasing doses of levothyroxine titrated up to 137 mcg daily.  Most recently this dose has been reduced back down to 0.125 mcg by Dr. Dagmar Hait who has been following her laboratory.   She now has been in atrial flutter for approximately 6  months.  She has been anticoagulated.  Today she continues to show atrial flutter with variable block with a ventricular rate at 104 bpm.  I am recommending further titration of Bystolic to 10 mg.  I discussed attempt at restoration of sinus rhythm and will schedule her for DC cardioversion.  Patient and daughter would like me to do the cardioversion procedure and as result I will schedule this to be done on my rounding week when I am in the hospital and she has been tentatively scheduled for this procedure to be done on June 21 at 10:30 AM.  I discussed the risk benefits of the procedure.  Repeat laboratory will need to be obtained and I will recheck a comprehensive metabolic panel, magnesium, CBC and TSH.   [name], MD, [facility]  08/20/2020 3:08 PM

## 2020-08-14 NOTE — Patient Instructions (Signed)
Medication Instructions:  INCREASE Bystolic to 10mg  daily.  *If you need a refill on your cardiac medications before your next appointment, please call your pharmacy*   Lab Work: CMet, Mg, CBC, TSH 1 week prior to cardioversion.    Testing/Procedures: Cardioversion, see instructions below.    Follow-Up: At [facility], you and your health needs are our priority.  As part of our continuing mission to provide you with exceptional heart care, we have created designated Provider Care Teams.  These Care Teams include your primary Cardiologist (physician) and Advanced Practice Providers (APPs -  Physician Assistants and Nurse Practitioners) who all work together to provide you with the care you need, when you need it.  We recommend signing up for the patient portal called "MyChart".  Sign up information is provided on this After Visit Summary.  MyChart is used to connect with patients for Virtual Visits (Telemedicine).  Patients are able to view lab/test results, encounter notes, upcoming appointments, etc.  Non-urgent messages can be sent to your provider as well.   To learn more about what you can do with MyChart, go to [email or web address].    Your next appointment:   6 week(s)  The format for your next appointment:   In Person  Provider:   Shelva Majestic, MD   Other Instructions   You are scheduled for a Cardioversion on August 29, 2020 with Dr. [name].  Please arrive at the [facility] (Main Entrance A) at [facility]: [address] [place], [place] [address] at 9:30 am. (1 hour prior to procedure unless lab work is needed; if lab work is needed arrive 1.5 hours ahead)  DIET: Nothing to eat or drink after midnight except a sip of water with medications (see medication instructions below)  Medication Instructions: Continue your anticoagulant: Eliquis You will need to continue your anticoagulant after your procedure until you  are told by your  Provider that it is safe  to stop   You must have a responsible person to drive you home and stay in the waiting area during your procedure. Failure to do so could result in cancellation.  Bring your insurance cards.  *Special Note: Every effort is made to have your procedure done on time. Occasionally there are emergencies that occur at the hospital that may cause delays. Please be patient if a delay does occur.

## 2020-08-20 ENCOUNTER — Encounter: Payer: Self-pay | Admitting: Cardiovascular Disease

## 2020-08-22 DIAGNOSIS — I480 Paroxysmal atrial fibrillation: Secondary | ICD-10-CM | POA: Diagnosis not present

## 2020-08-22 DIAGNOSIS — E039 Hypothyroidism, unspecified: Secondary | ICD-10-CM | POA: Diagnosis not present

## 2020-08-22 LAB — COMPREHENSIVE METABOLIC PANEL
ALT: 23 IU/L (ref 0–32)
AST: 36 IU/L (ref 0–40)
Albumin/Globulin Ratio: 1.4 (ref 1.2–2.2)
Albumin: 4.2 g/dL (ref 3.6–4.6)
Alkaline Phosphatase: 130 IU/L — ABNORMAL HIGH (ref 44–121)
BUN/Creatinine Ratio: 12 (ref 12–28)
BUN: 14 mg/dL (ref 8–27)
Bilirubin Total: 2.1 mg/dL — ABNORMAL HIGH (ref 0.0–1.2)
CO2: 22 mmol/L (ref 20–29)
Calcium: 9.8 mg/dL (ref 8.7–10.3)
Chloride: 103 mmol/L (ref 96–106)
Creatinine, Ser: 1.14 mg/dL — ABNORMAL HIGH (ref 0.57–1.00)
Globulin, Total: 3 g/dL (ref 1.5–4.5)
Glucose: 101 mg/dL — ABNORMAL HIGH (ref 65–99)
Potassium: 4.9 mmol/L (ref 3.5–5.2)
Sodium: 140 mmol/L (ref 134–144)
Total Protein: 7.2 g/dL (ref 6.0–8.5)
eGFR: 47 mL/min/{1.73_m2} — ABNORMAL LOW (ref >59)

## 2020-08-22 LAB — CBC
Hematocrit: 44.2 % (ref 34.0–46.6)
Hemoglobin: 15 g/dL (ref 11.1–15.9)
MCH: 30.2 pg (ref 26.6–33.0)
MCHC: 33.9 g/dL (ref 31.5–35.7)
MCV: 89 fL (ref 79–97)
Platelets: 120 10*3/uL — ABNORMAL LOW (ref 150–450)
RBC: 4.96 x10E6/uL (ref 3.77–5.28)
RDW: 12.6 % (ref 11.7–15.4)
WBC: 6.1 10*3/uL (ref 3.4–10.8)

## 2020-08-22 LAB — MAGNESIUM: Magnesium: 2 mg/dL (ref 1.6–2.3)

## 2020-08-22 LAB — TSH: TSH: 0.176 u[IU]/mL — ABNORMAL LOW (ref 0.450–4.500)

## 2020-08-28 ENCOUNTER — Telehealth: Payer: Self-pay | Admitting: Cardiovascular Disease

## 2020-08-28 NOTE — Telephone Encounter (Signed)
Spoke with pt's daughter Letta Moynahan (ok per [facility]) regarding taking eliquis day of cardioversion procedure.  Daughter wants to confirm that she does take this medication the day of procedure. Explained to daughter that pt is to continue to take this medication as prescribed and to not skip a dose with her upcoming procedure. Explained that if it is necessary to stop taking the medication Dr. [name] will give those instructions. Pt's daughter verbalizes understanding.

## 2020-08-28 NOTE — Telephone Encounter (Signed)
Pt c/o medication issue:  1. Name of Medication: ELIQUIS 5 MG TABS tablet  2. How are you currently taking this medication (dosage and times per day)? 1 tablet twice a day  3. Are you having a reaction (difficulty breathing--STAT)? no  4. What is your medication issue? Patient's daughter states they received a call from the hospital saying they patient needs to hold her eliquis tomorrow morning. She states she would like to confirm if this is true, because she thought the eliquis was something she would need to stay on for the procedure.

## 2020-08-29 ENCOUNTER — Ambulatory Visit (HOSPITAL_COMMUNITY): Payer: Medicare Other | Admitting: Anesthesiology

## 2020-08-29 ENCOUNTER — Encounter (HOSPITAL_COMMUNITY)
Admission: RE | Disposition: A | Discharge status: Home / Self Care | Payer: Self-pay | Source: Home / Self Care | Attending: Cardiovascular Disease

## 2020-08-29 ENCOUNTER — Ambulatory Visit (HOSPITAL_COMMUNITY)
Admission: RE | Admit: 2020-08-29 | Discharge: 2020-08-29 | Disposition: A | Discharge status: Home / Self Care | Payer: Medicare Other | Attending: Cardiovascular Disease | Admitting: Cardiovascular Disease

## 2020-08-29 DIAGNOSIS — I081 Rheumatic disorders of both mitral and tricuspid valves: Secondary | ICD-10-CM | POA: Insufficient documentation

## 2020-08-29 DIAGNOSIS — Z79890 Hormone replacement therapy (postmenopausal): Secondary | ICD-10-CM | POA: Diagnosis not present

## 2020-08-29 DIAGNOSIS — E785 Hyperlipidemia, unspecified: Secondary | ICD-10-CM | POA: Diagnosis not present

## 2020-08-29 DIAGNOSIS — E039 Hypothyroidism, unspecified: Secondary | ICD-10-CM | POA: Insufficient documentation

## 2020-08-29 DIAGNOSIS — Z7901 Long term (current) use of anticoagulants: Secondary | ICD-10-CM | POA: Diagnosis not present

## 2020-08-29 DIAGNOSIS — I700 Atherosclerosis of aorta: Secondary | ICD-10-CM | POA: Insufficient documentation

## 2020-08-29 DIAGNOSIS — Z87891 Personal history of nicotine dependence: Secondary | ICD-10-CM | POA: Diagnosis not present

## 2020-08-29 DIAGNOSIS — I10 Essential (primary) hypertension: Secondary | ICD-10-CM | POA: Diagnosis not present

## 2020-08-29 DIAGNOSIS — Z79899 Other long term (current) drug therapy: Secondary | ICD-10-CM | POA: Diagnosis not present

## 2020-08-29 DIAGNOSIS — I4892 Unspecified atrial flutter: Secondary | ICD-10-CM

## 2020-08-29 DIAGNOSIS — I483 Typical atrial flutter: Secondary | ICD-10-CM | POA: Diagnosis not present

## 2020-08-29 DIAGNOSIS — I739 Peripheral vascular disease, unspecified: Secondary | ICD-10-CM | POA: Insufficient documentation

## 2020-08-29 HISTORY — PX: CARDIOVERSION: SHX1299

## 2020-08-29 SURGERY — CARDIOVERSION
Anesthesia: General

## 2020-08-29 MED ORDER — METOPROLOL TARTRATE 5 MG/5ML IV SOLN
INTRAVENOUS | Status: AC
  Filled 2020-08-29: qty 5

## 2020-08-29 MED ORDER — LIDOCAINE HCL (CARDIAC) PF 100 MG/5ML IV SOSY
PREFILLED_SYRINGE | INTRAVENOUS | Status: DC | PRN
  Administered 2020-08-29: 100 mg via INTRATRACHEAL

## 2020-08-29 MED ORDER — SODIUM CHLORIDE 0.9 % IV SOLN: INTRAVENOUS | Status: DC

## 2020-08-29 MED ORDER — METOPROLOL TARTRATE 5 MG/5ML IV SOLN
INTRAVENOUS | Status: DC | PRN
  Administered 2020-08-29: 2.5 mg via INTRAVENOUS

## 2020-08-29 MED ORDER — PROPOFOL 10 MG/ML IV BOLUS
INTRAVENOUS | Status: DC | PRN
  Administered 2020-08-29: 50 mg via INTRAVENOUS

## 2020-08-29 MED ORDER — SODIUM CHLORIDE 0.9 % IV SOLN: INTRAVENOUS | Status: DC | PRN

## 2020-08-29 NOTE — CV Procedure (Addendum)
  CARDIOVERSION NOTE   Procedure: Electrical Cardioversion Indications:  Atrial Flutter  Procedure Details:  Consent: Risks of procedure as well as the alternatives and risks of each were explained to the (patient/caregiver).  Consent for procedure obtained.  Time Out: Verified patient identification, verified procedure, site/side was marked, verified correct patient position, special equipment/implants available, medications/allergies/relevent history reviewed, required imaging and test results available.  Performed  Patient placed on cardiac monitor, pulse oximetry, supplemental oxygen as necessary.  Sedation given:  propofol 50 mg, lidocaine 100 mg Pacer pads placed anterior and posterior chest.  Cardioverted  4 time(s). Pt given metoprolol 2.5 mg iv.  Cardioverted at [place].  Evaluation: Findings: Post procedure EKG shows: NSR Complications: None Patient did tolerate procedure well.     [name], MD, [facility] 08/29/2020 10:42 AM

## 2020-08-29 NOTE — Anesthesia Procedure Notes (Signed)
Procedure Name: General with mask airway Date/Time: 08/29/2020 10:29 AM Performed by: Kathryne Hitch, CRNA Pre-anesthesia Checklist: Emergency Drugs available, Suction available, Patient identified, Patient being monitored and Timeout performed Patient Re-evaluated:Patient Re-evaluated prior to induction Oxygen Delivery Method: Simple face mask Preoxygenation: Pre-oxygenation with 100% oxygen Induction Type: IV induction Dental Injury: Teeth and Oropharynx as per pre-operative assessment

## 2020-08-29 NOTE — Anesthesia Postprocedure Evaluation (Signed)
Anesthesia Post Note  Patient: Danielle [name]  Procedure(s) Performed: CARDIOVERSION     Patient location during evaluation: PACU Anesthesia Type: General Level of consciousness: awake and alert Pain management: pain level controlled Vital Signs Assessment: post-procedure vital signs reviewed and stable Respiratory status: spontaneous breathing, nonlabored ventilation, respiratory function stable and patient connected to nasal cannula oxygen Cardiovascular status: blood pressure returned to baseline and stable Postop Assessment: no apparent nausea or vomiting Anesthetic complications: no   No notable events documented.  Last Vitals:  Vitals:   08/29/20 1054 08/29/20 1104  BP: (!) 142/74 (!) 142/90  Pulse: 95 95  Resp: 19 17  Temp:    SpO2: 97% 99%    Last Pain:  Vitals:   08/29/20 1104  TempSrc:   PainSc: 0-No pain                 Jaksen Fiorella

## 2020-08-29 NOTE — Transfer of Care (Signed)
Immediate Anesthesia Transfer of Care Note  Patient: Danielle [name]  Procedure(s) Performed: CARDIOVERSION  Patient Location: Endoscopy Unit  Anesthesia Type:General  Level of Consciousness: drowsy and patient cooperative  Airway & Oxygen Therapy: Patient Spontanous Breathing and Patient connected to face mask oxygen  Post-op Assessment: Report given to RN and Post -op Vital signs reviewed and stable  Post vital signs: Reviewed and stable  Last Vitals:  Vitals Value Taken Time  BP 111/51   Temp    Pulse 69   Resp 15   SpO2 99     Last Pain:  Vitals:   08/29/20 1000  TempSrc: Oral  PainSc: 0-No pain         Complications: No notable events documented.

## 2020-08-29 NOTE — Interval H&P Note (Signed)
History and Physical Interval Note:  08/29/2020 10:24 AM  [name]  has presented today for surgery, with the diagnosis of AFLUTTER.  The various methods of treatment have been discussed with the patient and family. After consideration of risks, benefits and other options for treatment, the patient has consented to  Procedure(s): CARDIOVERSION (N/A) as a surgical intervention.  The patient's history has been reviewed, patient examined, no change in status, stable for surgery.  I have reviewed the patient's chart and labs.  Questions were answered to the patient's satisfaction.     Shelva Majestic

## 2020-08-29 NOTE — Anesthesia Preprocedure Evaluation (Addendum)
Anesthesia Evaluation  Patient identified by MRN, date of birth, ID band Patient awake    Reviewed: Allergy & Precautions, H&P , NPO status , Patient's Chart, lab work & pertinent test results, reviewed documented beta blocker date and time   Airway Mallampati: I  TM Distance: >3 FB Neck ROM: full    Dental no notable dental hx. (+) Dental Advisory Given, Edentulous Upper, Edentulous Lower   Pulmonary neg pulmonary ROS, asthma , former smoker,    Pulmonary exam normal breath sounds clear to auscultation       Cardiovascular Exercise Tolerance: Good hypertension, Pt. on home beta blockers + Peripheral Vascular Disease  + dysrhythmias Atrial Fibrillation  Rhythm:regular Rate:Normal     Neuro/Psych  Headaches, Anxiety US CAROTID 2/22 Right Carotid: Velocities in the right ICA are consistent with a stable  1-39%  stenosis. S/p CEA.   Left  Velocities in the left ICA are consistent with a stable 1-39% Carotid:stenosis.   Vertebrals: Bilateral vertebral arteries demonstrate antegrade flow.  Subclavians: Normal flow hemodynamics were seen in bilateral subclavian        arteries.  negative neurological ROS  negative psych ROS   GI/Hepatic negative GI ROS, Neg liver ROS,   Endo/Other  Hypothyroidism   Renal/GU negative Renal ROS  negative genitourinary   Musculoskeletal  (+) Arthritis ,   Abdominal   Peds  Hematology negative hematology ROS (+)   Anesthesia Other Findings   Reproductive/Obstetrics negative OB ROS                           Anesthesia Physical  Anesthesia Plan  ASA: 3  Anesthesia Plan: General   Post-op Pain Management:    Induction: Intravenous  PONV Risk Score and Plan: 3  Airway Management Planned: Natural Airway and Simple Face Mask  Additional Equipment: None  Intra-op Plan:   Post-operative Plan:   Informed Consent: I have reviewed the patients  History and Physical, chart, labs and discussed the procedure including the risks, benefits and alternatives for the proposed anesthesia with the patient or authorized representative who has indicated his/her understanding and acceptance.     Dental advisory given  Plan Discussed with: CRNA and Anesthesiologist  Anesthesia Plan Comments:         Anesthesia Quick Evaluation

## 2020-08-29 NOTE — Discharge Instructions (Signed)
Electrical Cardioversion  Electrical cardioversion is the delivery of a jolt of electricity to restore a normal rhythm to the heart. A rhythm that is too fast or is not regular keeps the heart from pumping well. In this procedure, sticky patches or metal paddles are placed on the chest to deliver electricity to the heart from a device.  What can I expect after the procedure?  Your blood pressure, heart rate, breathing rate, and blood oxygen level will be monitored until you leave the hospital or clinic.  Your heart rhythm will be watched to make sure it does not change.  You may have some redness on the skin where the shocks were given.If this occurs, can use hydrocortisone cream or Aloe vera.  Follow these instructions at home:  Do not drive for 24 hours if you were given a sedative during your procedure.  Take over-the-counter and prescription medicines only as told by your health care provider.  Ask your health care provider how to check your pulse. Check it often.  Rest for 48 hours after the procedure or as told by your health care provider.  Avoid or limit your caffeine use as told by your health care provider.  Keep all follow-up visits as told by your health care provider. This is important.  Contact a health care provider if:  You feel like your heart is beating too quickly or your pulse is not regular.  You have a serious muscle cramp that does not go away.  Get help right away if:  You have discomfort in your chest.  You are dizzy or you feel faint.  You have trouble breathing or you are short of breath.  Your speech is slurred.  You have trouble moving an arm or leg on one side of your body.  Your fingers or toes turn cold or blue.  Summary  Electrical cardioversion is the delivery of a jolt of electricity to restore a normal rhythm to the heart.  This procedure may be done right away in an emergency or may be a scheduled procedure if the condition is not  an emergency.  Generally, this is a safe procedure.  After the procedure, check your pulse often as told by your health care provider.  This information is not intended to replace advice given to you by your health care provider. Make sure you discuss any questions you have with your health care provider. Document Revised: 09/28/2018 Document Reviewed: 09/28/2018 Elsevier Patient Education  2021 [employer]  

## 2020-08-31 ENCOUNTER — Telehealth: Payer: Self-pay | Admitting: Cardiovascular Disease

## 2020-08-31 ENCOUNTER — Encounter (HOSPITAL_COMMUNITY): Payer: Self-pay | Admitting: Cardiovascular Disease

## 2020-08-31 NOTE — Telephone Encounter (Signed)
This RN called patient's daughter [name] back, ok per DPR. Per pt's daughter pt has had memory problems and can't remember what her lab results were. This RN relayed the following from Dr. [name]:  [name], MD  08/28/2020  3:08 PM EDT      Creatinine is minimally increased at 1.14.  Bilirubin is increased to 2.1 which is new.  Mg 2.0; CBC stable but platelets mildly decreased at 120.  TSH is over suppressed at 0.176.  Her thyroid is followed by Dr. Elsworth Soho and her levothyroxine dose had recently been further reduced back to 1.25 mcg daily.  This may need to be further reduced as well.  He is scheduled for outpatient DC cardioversionwith me tomorrow on August 29, 2020   Pt's daughter also made aware of call with Lars Mage, per phone note, Lars Mage to send results to PCP. This RN told patient's daughter results could also be further discussed in pt's upcoming appointment on July 19. Pt's daughter verbalized understanding.

## 2020-08-31 NOTE — Telephone Encounter (Signed)
Patient's daughter is requesting to review the patient's lab results. She states the patient is developing dementia and doesn't remember what she was told.

## 2020-09-06 ENCOUNTER — Telehealth: Payer: Self-pay | Admitting: Cardiovascular Disease

## 2020-09-06 NOTE — Telephone Encounter (Signed)
Spoke with pt. She state she was concerned because her BP this morning was 95/51 but was asymptomatic. While on the phone, BP has increased to 113/69. Nurse advised pt to continue to keep a log of BP and contact office she noticed its consistently staying less than 100. Pt verbalized understanding but will forward to MD to make aware.

## 2020-09-06 NOTE — Telephone Encounter (Signed)
Pt's daughter is calling in Her mom' BP is 95/51 And then 106 she wanting to nknow the low bp is it something to be worried about

## 2020-09-14 NOTE — Telephone Encounter (Signed)
Agree with recommendations.  

## 2020-09-26 ENCOUNTER — Ambulatory Visit (INDEPENDENT_AMBULATORY_CARE_PROVIDER_SITE_OTHER): Payer: Medicare Other | Admitting: Cardiovascular Disease

## 2020-09-26 ENCOUNTER — Encounter: Payer: Self-pay | Admitting: Cardiovascular Disease

## 2020-09-26 ENCOUNTER — Other Ambulatory Visit: Payer: Self-pay

## 2020-09-26 VITALS — BP 102/60 | HR 97 | Ht 66.0 in | Wt 140.6 lb

## 2020-09-26 DIAGNOSIS — I358 Other nonrheumatic aortic valve disorders: Secondary | ICD-10-CM

## 2020-09-26 DIAGNOSIS — I6523 Occlusion and stenosis of bilateral carotid arteries: Secondary | ICD-10-CM

## 2020-09-26 DIAGNOSIS — I483 Typical atrial flutter: Secondary | ICD-10-CM

## 2020-09-26 DIAGNOSIS — Z7901 Long term (current) use of anticoagulants: Secondary | ICD-10-CM | POA: Diagnosis not present

## 2020-09-26 DIAGNOSIS — I340 Nonrheumatic mitral (valve) insufficiency: Secondary | ICD-10-CM

## 2020-09-26 DIAGNOSIS — E039 Hypothyroidism, unspecified: Secondary | ICD-10-CM

## 2020-09-26 MED ORDER — LEVOTHYROXINE SODIUM 100 MCG PO TABS: 100.0000 ug | ORAL_TABLET | Freq: Every day | ORAL | 3 refills | Status: DC

## 2020-09-26 NOTE — Progress Notes (Signed)
Patient ID: Danielle [name], female   DOB: Jun 18, 1932, 87 y.o.   MRN: [id number]     HPI: Danielle [name] is a 87 y.o. female who presents to the office today for a cardiology evaluation in follow-up of her 08/29/2020 cardioversion for atrial flutter  Danielle [name] has a history of peripheral vascular disease and is status post right carotid endarterectomy in 2005. She also has a history of hypothyroidism, hypertension, palpitations, hyperlipidemia and has intermittent seasonal allergies. In January 2011 Echo Doppler study showed normal systolic function with grade 1 diastolic dysfunction, mild MR and TR and mild aortic sclerosis. A nuclear perfusion study at that time revealed normal perfusion.  A carotid duplex study was in January 2012 was was mildly abnormal with irregular mixed density plaque but was without significant additional stenosis.  She sees Dr Dagmar Hait, for primary care. Laboratory in September 2013 showed a cholesterol of 136 triglyceride 64 HDL 50 LDL 73. Normal renal function. She was not anemic.  Danielle [name] underwent  bilateral cataract surgery last summer by Dr. Prudencio Burly at [facility]. She tolerated surgery well from a cardiovascular standpoint. She denies recent leg swelling although she does have a prescription for Lasix. She is tolerating her Bystolic and denies any recent palpitations.   An echo Doppler study in October 2014 revealed normal systolic function with an ejection fraction of 60-65%.  She did not have regional wall motion abnormalities.  There is grade 1 diastolic dysfunction.  There was evidence for aortic valve sclerosis without stenosis, mild to moderate mitral regurgitation, and mild tricuspid regurgitation.  She had mild pulmonary hypertension with an estimated RV systolic pressure at 33 mm.  Prior to planned left shoulder replacement in May 2015, she underwent a preoperative nuclear perfusion study  in April 2015.  This was low risk and showed some basal  septal, artifact without scar or ischemia.  Danielle [name] underwent left shoulder surgery and on 07/20/2013 and on 11/16/2013 underwent right shoulder replacement surgery by Dr. [name].  She did have some confusion, which may be medication related.  Following her second surgery.  She tolerated both of these were cardiovascular standpoint.  When I last saw her in 2018 after not having seen her in over 2 years she was without episodes of chest pain.  She had seen Dr. Dagmar Hait.  Laboratory was performed.  She was found to have an over suppressed thyroid with a TSH of 0.09 and her Synthroid dose was reduced from 137 g to 125 g.  In addition, her LDL was increased at 121 and apparently she was just started on a prescription of very low-dose rosuvastatin at 5 mg.  She tells me that last week she tripped and fell backwards and hit her head.  She underwent an CT evaluation on 12/15/2016, which did not show any acute traumatic finding.  There was ordinary.  Generalized atrophy with mild chronic small vessel changes of the white matter.  She is unaware of palpitations.   I recommended that he undergo a follow-up echo Doppler study which was done on January 19, 2017. This revealed normal LV function with EF 55 to 60%. There was abnormal tissue Doppler. There was evidence for aortic sclerosis without stenosis with mild aortic insufficiency.  She underwent follow-up carotid studies in December 2019 which showed mild 1 to 39% stenoses in the right and left ICA. There was nonsignificant plaque in the common carotid artery on the left. She had normal flow dynamics in the vertebral and subclavian  vessels.   I saw her in April 2021 after not having seen her since October 2018.  At that time she denied any episodes of chest pain and was continued to see Dr. Elsworth Soho who was checking her laboratory.   She states that she is now on rosuvastatin but she did not know the dose. She has continued to be on nebivolol 5 mg daily for blood  pressure and palpitations. She is on levothyroxine 125 mcg for hypothyroidism.   She had undergone laboratory by Dr. Dagmar Hait in December 2021.  At that time her potassium was 5.7.  LDL cholesterol was 115 with total cholesterol 186 triglycerides 81 HDL 55.  TSH was significantly increased at 68.97.  She was found to have developed atrial fibs/flutter.  She was seen by Fabian Sharp in January and again in February 2022 at which time she was still felt to be in atrial fibs/flutter.  A 2D echo Doppler study from April 26, 2020 showed an EF of 60 to 65%.  There was mild concentric LVH.  Left atrial size with mild to moderately dilated.  The right atrium was mildly dilated.  There was mild to moderate mitral regurgitation and mild to moderate aortic sclerosis.  Carotid duplex imaging showed 1 to 39% narrowings bilaterally in her carotids with normal vertebral and subclavian flow.  When last seen by Fabian Sharp, she was uncertain as to if her thyroid had been corrected and as result deferred DC cardioversion until TSH was normalized.   I saw her in follow-up of Dara Hoyer evaluations on Jul 13, 2020.  Over the prior several months her levothyroxine dose had been increased from 1.12  to 1.25 and most recently 1.37 mcg daily.  She tells me she had lab work done yesterday by Dr. Dagmar Hait.  When I saw her she denied any chest pain and was unaware of any cardiac rhythm issues.  She denied shortness of breath, presyncope or syncope.  During that evaluation, her EKG continued to show atrial flutter with variable block versus coarse atrial fibrillation.  Her ventricular rate was 78 bpm and I recommended slight titration of Bystolic from 5 mg up to 7.5 mg daily.  She also had recently been started on pantoprazole by Dr. Elsworth Soho.  I saw her in follow-up on August 14, 2020.  Her dose of levothyroxine had further been reduced back to 1.25 mcg daily.  She was unaware of any heart rate irregularity.  She denies shortness of breath or  chest pain.  She remains on Eliquis 5 mg twice a day.  She is on rosuvastatin 5 mg.  During that evaluation, I further titrated her Bystolic to 10 mg daily discussed attempt at restoration of sinus rhythm with DC cardioversion.  Danielle [name] underwent successful DC cardioversion on August 29, 2020 with restoration of sinus rhythm.  Laboratory from around that time still showed her thyroid to be over suppressed with a TSH of 0.176 and her dose of levothyroxine was further reduced to 112 mcg by Dr. Dagmar Hait.  Presently, she is unaware of recurrent palpitations.  She feels well.  She has been taking Bystolic 10 mg, levothyroxine 112 mcg continues to be on Eliquis 5 mg twice a day for anticoagulation.  She continues to be on rosuvastatin 5 mg for hyperlipidemia and has been taking vitamin D supplementation.  She denies chest pain, PND orthopnea.  She denies any diarrhea.  She presents for evaluation.  Past Medical History:  Diagnosis Date   Anxiety  Arthritis    Right shoulder arthritis   Asthma    Atrial fibrillation ([place])    Carotid artery disease (HCC)    Chronic anticoagulation    Diverticulitis    Dysrhythmia    Headache(784.0)    Heart murmur    Hypothyroidism    Memory impairment    Osteoarthritis of left shoulder 07/20/2013   Peripheral vascular disease (HCC)    carotid    Primary osteoarthritis of right shoulder 11/16/2013   Seasonal allergies     Past surgical history is notable for right carotid endarterectomy and recent bilateral cataract surgeries.  No Known Allergies  Current Outpatient Medications  Medication Sig Dispense Refill   acetaminophen (TYLENOL) 500 MG tablet Take 500 mg by mouth every 6 (six) hours as needed for moderate pain.     calcium carbonate (OS-CAL) 600 MG TABS tablet Take 600 mg by mouth daily with breakfast.      cetirizine (ZYRTEC) 10 MG tablet Take 10 mg by mouth daily as needed for allergies.     Cholecalciferol (VITAMIN D) 125 MCG (5000 UT) CAPS Take 5,000  Units by mouth every 7 (seven) days.     ELIQUIS 5 MG TABS tablet Take 5 mg by mouth 2 (two) times daily.     fluticasone (FLONASE) 50 MCG/ACT nasal spray Place 2 sprays into both nostrils daily as needed for allergies.     guaiFENesin (MUCINEX) 600 MG 12 hr tablet Take 1,200 mg by mouth daily.     Multiple Vitamin (MULTIVITAMIN WITH MINERALS) TABS tablet Take 1 tablet by mouth daily.     nebivolol (BYSTOLIC) 10 MG tablet Take 1 tablet (10 mg total) by mouth daily. 90 tablet 3   rosuvastatin (CRESTOR) 5 MG tablet Take 5 mg by mouth daily.     hydroxypropyl methylcellulose / hypromellose (ISOPTO TEARS / GONIOVISC) 2.5 % ophthalmic solution Place 1 drop into both eyes 3 (three) times daily as needed for dry eyes. (Patient not taking: Reported on 09/26/2020)     levothyroxine (SYNTHROID) 100 MCG tablet Take 1 tablet (100 mcg total) by mouth daily before breakfast. 90 tablet 3   No current facility-administered medications for this visit.    Social History   Socioeconomic History   Marital status: Married    Spouse name: Not on file   Number of children: Not on file   Years of education: Not on file   Highest education level: Not on file  Occupational History   Not on file  Tobacco Use   Smoking status: Former    Packs/day: 0.25    Years: 2.00    Pack years: 0.50    Types: Cigarettes    Quit date: 07/13/1963    Years since quitting: 57.2   Smokeless tobacco: Never   Tobacco comments:    quit aprrox 50 years ago.  Vaping Use   Vaping Use: Never used  Substance and Sexual Activity   Alcohol use: No   Drug use: No   Sexual activity: Not on file  Other Topics Concern   Not on file  Social History Narrative   Not on file   Social Determinants of Health   Financial Resource Strain: Not on file  Food Insecurity: Not on file  Transportation Needs: Not on file  Physical Activity: Not on file  Stress: Not on file  Social Connections: Not on file  Intimate Partner Violence: Not on  file    Family history is notable that both parents are deceased mother  at age 17 with cancer father at age 28. Her brother is deceased also and had various cancers. His sister is also deceased. She is the mother of my patient Danielle [name].  ROS General: Negative; No fevers, chills, or night sweats;  HEENT: Vision has significantly improved following cataract surgery No changes in vision or hearing, sinus congestion, difficulty swallowing Pulmonary: Negative; No cough, wheezing, shortness of breath, hemoptysis Cardiovascular: See HPI GI: Negative; No nausea, vomiting, diarrhea, or abdominal pain GU: Negative; No dysuria, hematuria, or difficulty voiding Musculoskeletal: Positive for osteopenia versus osteoporosis.  She is status post bilateral sequential shoulder replacement surgeries;  Hematologic/Oncology: Negative; no easy bruising, bleeding Endocrine: Positive for hypothyroidism Neuro: Negative; no changes in balance, headaches Skin: Negative; No rashes or skin lesions Psychiatric: Negative; No behavioral problems, depression Sleep: Negative; No snoring, daytime sleepiness, hypersomnolence, bruxism, restless legs, hypnogognic hallucinations, no cataplexy Other comprehensive 14 point system review is negative.   PE BP 102/60 (BP Location: Left Arm, Patient Position: Sitting, Cuff Size: Normal)   Pulse 97   Ht _0  (1.676 m)   Wt 140 lb 9.6 oz (63.8 kg)   SpO2 98%   BMI 22.69 kg/m    Repeat blood pressure by me 118/68  Wt Readings from Last 3 Encounters:  09/26/20 140 lb 9.6 oz (63.8 kg)  08/29/20 140 lb (63.5 kg)  08/14/20 143 lb (64.9 kg)   General: Alert, oriented, no distress.  Skin: normal turgor, no rashes, warm and dry HEENT: Normocephalic, atraumatic. Pupils equal round and reactive to light; sclera anicteric; extraocular muscles intact; Nose without nasal septal hypertrophy Mouth/Parynx benign; Mallinpatti scale 3 Neck: No JVD, no carotid bruits; normal carotid  upstroke Lungs: clear to ausculatation and percussion; no wheezing or rales Chest wall: without tenderness to palpitation Heart: PMI not displaced, irregular with a ventricular rate in the 90s, s1 s2 normal, 1/6 systolic murmur, no diastolic murmur, no rubs, gallops, thrills, or heaves Abdomen: soft, nontender; no hepatosplenomehaly, BS+; abdominal aorta nontender and not dilated by palpation. Back: no CVA tenderness Pulses 2+ Musculoskeletal: full range of motion, normal strength, no joint deformities Extremities: no clubbing cyanosis or edema, Homan's sign negative  Neurologic: grossly nonfocal; Cranial nerves grossly wnl Psychologic: Normal mood and affect   September 26, 2020 ECG (independently read by me):  Atria flutter with variable block at 97; nonspecific T changes; QTc 485 msec  August 14, 2020 ECG (independently read by me): Atrial flutter at 91, QTc 322  Jul 13, 2020 ECG (independently read by me): Atrilal flutter at 79, QTc 458 msec  June 24, 2019 ECG (independently read by me): Normal sinus rhythm at 72 bpm. First-degree AV block with a PR normal at 218 ms.  October 2018 ECG (independently read by me): Sinus bradycardia at 57 bpm.  First-degree AV block with a PR interval of 222 ms.  No significant ST-T changes.  September 2016 ECG (independently read by me):  Normal sinus rhythm at 67 bpm.  No ectopy.    Ffirst-degree AV block  PR interval at 206 ms.  September 2015 ECG (independently read by me): Sinus rhythm with first-degree AV block.  March 2015 ECG (independently read by me): Normal sinus rhythm with PACs and a transient trigeminal rhythm.  ECG: Sinus rhythm 84 beats per minute period. We'll 168 ms. QTc interval 470 ms. No significant ST-T changes.  LABS: BMP Latest Ref Rng & Units 08/22/2020 10/09/2019 02/01/2016  Glucose 65 - 99 mg/dL 101(H) 109(H) 105(H)  BUN 8 -  27 mg/dL _0 Creatinine 0.57 - 1.00 mg/dL 1.14(H) 0.93 1.06(H)  BUN/Creat Ratio 12 - 28 12 - -   Sodium 134 - 144 mmol/L 140 138 137  Potassium 3.5 - 5.2 mmol/L 4.9 4.1 4.0  Chloride 96 - 106 mmol/L 103 104 106  CO2 20 - 29 mmol/L 22 21(L) 22  Calcium 8.7 - 10.3 mg/dL 9.8 9.6 10.0   Hepatic Function Latest Ref Rng & Units 08/22/2020 10/09/2019 02/01/2016  Total Protein 6.0 - 8.5 g/dL 7.2 8.3(H) 7.2  Albumin 3.6 - 4.6 g/dL 4.2 4.3 4.3  AST 0 - 40 IU/L 36 38 42(H)  ALT 0 - 32 IU/L _1 Alk Phosphatase 44 - 121 IU/L 130(H) 102 81  Total Bilirubin 0.0 - 1.2 mg/dL 2.1(H) 1.1 1.4(H)   CBC Latest Ref Rng & Units 08/22/2020 10/09/2019 02/01/2016  WBC 3.4 - 10.8 x10E3/uL 6.1 9.5 7.9  Hemoglobin 11.1 - 15.9 g/dL 15.0 16.3(H) 15.3(H)  Hematocrit 34.0 - 46.6 % 44.2 48.0(H) 44.0  Platelets 150 - 450 x10E3/uL 120(L) 184 192   Lab Results  Component Value Date   MCV 89 08/22/2020   MCV 90.9 10/09/2019   MCV 88.5 02/01/2016   Lipid Panel  No results found for: CHOL, TRIG, HDL, CHOLHDL, VLDL, LDLCALC, LDLDIRECT   RADIOLOGY: No results found.   CV STUDIES:    ECHO 04/26/2020 IMPRESSIONS   1. Left ventricular ejection fraction, by estimation, is 60 to 65%. The  left ventricle has normal function. The left ventricle has no regional  wall motion abnormalities. There is mild concentric left ventricular  hypertrophy. Left ventricular diastolic  function could not be evaluated.   2. Right ventricular systolic function is normal. The right ventricular  size is normal. There is normal pulmonary artery systolic pressure.   3. Left atrial size was mild to moderately dilated.   4. Right atrial size was mildly dilated.   5. The mitral valve is degenerative. Mild to moderate mitral valve  regurgitation. Moderate mitral annular calcification.   6. Tricuspid valve regurgitation is mild to moderate.   7. The aortic valve is tricuspid. There is mild calcification of the  aortic valve. There is mild thickening of the aortic valve. Aortic valve  regurgitation is mild. Mild to moderate aortic  valve  sclerosis/calcification is present, without any evidence  of aortic stenosis.   8. The inferior vena cava is normal in size with <50% respiratory  variability, suggesting right atrial pressure of 8 mmHg.   Comparison(s): No significant change from prior study.   IMPRESSION:  1. Atrial flutter with variable block ([place])   2. Hypothyroidism, unspecified type   3. Chronic anticoagulation   4. Mitral valve insufficiency, unspecified etiology   5. Aortic valve sclerosis     ASSESSMENT AND PLAN: Danielle [name] is a 87 year old female has a history of  hypertension, hyperlipidemia, tachypalpitations, and peripheral vascular disease. She is status post right carotid carotid endarterectomy in 2003. An echo Doppler study in 2014  showed normal systolic function, aortic valve sclerosis without stenosis with trace aortic insufficiency. She also had mitral annular calcification with mild to moderate central regurgitation. There was grade 1 diastolic dysfunction.  She has been on Bystolic. An echo in 2018 continued to show aortic sclerosis with mild LVH and abnormal Doppler parameters consistent with elevated ventricular end-diastolic filling pressure. There was aortic sclerosis with mild aortic insufficiency.  When I saw her in April 2021 she was maintaining sinus rhythm and  feeling well.  Since January 2022 she has been documented to be in atrial fibrillation/flutter.  Apparently her TSH level was significantly increased on laboratories in late December and she was treated with increasing doses of levothyroxine titrated up to 137 mcg daily.  Subsequent thyroid function became over suppressed and her dose was reduced to 125 mcg.  Her dose of Bystolic was increased to 10 mg and she continued to be in atrial flutter with variable block.  On August 29, 2020 she underwent successful cardioversion with restoration of sinus rhythm.  Presently she is unaware of recurrent symptomatology but her ECG today confirms  she is back in atrial flutter.  Her most recent TSH was over suppressed on August 22, 2020 and her dose of levothyroxine was reduced at that time to 1.12 mcg.  Since she is in atrial flutter today, I have recommended slight additional further reduction to 100 mcg of levothyroxine.  I also discussed her case with Dr. Crissie Sickles to consider possible candidacy for atrial flutter ablation.  Her QTc interval today is slightly prolonged at 485 ms and I will not initiate any antiarrhythmic therapy.  I have recommended she continue rate control with Bystolic 10 mg presently and initiate the reduced dose of levothyroxine.  We will try to arrange an evaluation with Dr. Lovena Le over the next week.  I discussed this in detail with the patient and her daughter in the office today.  She will be undergoing repeat thyroid function studies in several weeks with Dr. Dagmar Hait.     [name], MD, [facility]  09/27/2020 4:47 PM

## 2020-09-26 NOTE — Patient Instructions (Signed)
Medication Instructions:  DECREASE levothyroxine to 186mcg (1 tablet) daily.  *If you need a refill on your cardiac medications before your next appointment, please call your pharmacy*   Lab Work: None ordered.    Testing/Procedures: None ordered.    Follow-Up: At [facility], you and your health needs are our priority.  As part of our continuing mission to provide you with exceptional heart care, we have created designated Provider Care Teams.  These Care Teams include your primary Cardiologist (physician) and Advanced Practice Providers (APPs -  Physician Assistants and Nurse Practitioners) who all work together to provide you with the care you need, when you need it.  We recommend signing up for the patient portal called "MyChart".  Sign up information is provided on this After Visit Summary.  MyChart is used to connect with patients for Virtual Visits (Telemedicine).  Patients are able to view lab/test results, encounter notes, upcoming appointments, etc.  Non-urgent messages can be sent to your provider as well.   To learn more about what you can do with MyChart, go to [email or web address].    Your next appointment:   Follow up with Dr. Lovena Le next week at [facility].

## 2020-09-27 ENCOUNTER — Encounter: Payer: Self-pay | Admitting: Cardiovascular Disease

## 2020-09-29 ENCOUNTER — Encounter: Payer: Self-pay | Admitting: Internal Medicine

## 2020-09-29 ENCOUNTER — Ambulatory Visit (INDEPENDENT_AMBULATORY_CARE_PROVIDER_SITE_OTHER): Payer: Medicare Other | Admitting: Internal Medicine

## 2020-09-29 ENCOUNTER — Other Ambulatory Visit: Payer: Self-pay

## 2020-09-29 VITALS — BP 114/68 | HR 56 | Ht 66.0 in | Wt 141.2 lb

## 2020-09-29 DIAGNOSIS — I4892 Unspecified atrial flutter: Secondary | ICD-10-CM

## 2020-09-29 NOTE — Progress Notes (Signed)
HPI Danielle [name] is referred today by Dr. [name] for evaluation of atrial flutter. She has a h/o atrial fib and flutter and has undergone DCCV. She has a controlled VR. She has not had syncope. She has lost weight. Her past history is well documented. She has chronic fatigue but this is mild. She has had a fairly well controlled VR. She has lost about 20 lbs in the last year.  No Known Allergies   Current Outpatient Medications  Medication Sig Dispense Refill   acetaminophen (TYLENOL) 500 MG tablet Take 500 mg by mouth every 6 (six) hours as needed for moderate pain.     calcium carbonate (OS-CAL) 600 MG TABS tablet Take 600 mg by mouth daily with breakfast.      cetirizine (ZYRTEC) 10 MG tablet Take 10 mg by mouth daily as needed for allergies.     Cholecalciferol (VITAMIN D) 125 MCG (5000 UT) CAPS Take 5,000 Units by mouth every 7 (seven) days.     ELIQUIS 5 MG TABS tablet Take 5 mg by mouth 2 (two) times daily.     fluticasone (FLONASE) 50 MCG/ACT nasal spray Place 2 sprays into both nostrils daily as needed for allergies.     guaiFENesin (MUCINEX) 600 MG 12 hr tablet Take 1,200 mg by mouth daily.     hydroxypropyl methylcellulose / hypromellose (ISOPTO TEARS / GONIOVISC) 2.5 % ophthalmic solution Place 1 drop into both eyes 3 (three) times daily as needed for dry eyes.     levothyroxine (SYNTHROID) 100 MCG tablet Take 1 tablet (100 mcg total) by mouth daily before breakfast. 90 tablet 3   Multiple Vitamin (MULTIVITAMIN WITH MINERALS) TABS tablet Take 1 tablet by mouth daily.     nebivolol (BYSTOLIC) 10 MG tablet Take 1 tablet (10 mg total) by mouth daily. 90 tablet 3   rosuvastatin (CRESTOR) 5 MG tablet Take 5 mg by mouth daily.     No current facility-administered medications for this visit.     Past Medical History:  Diagnosis Date   Anxiety    Arthritis    Right shoulder arthritis   Asthma    Atrial fibrillation ([place])    Carotid artery disease (HCC)    Chronic  anticoagulation    Diverticulitis    Dysrhythmia    Headache(784.0)    Heart murmur    Hypothyroidism    Memory impairment    Osteoarthritis of left shoulder 07/20/2013   Peripheral vascular disease ([place])    carotid    Primary osteoarthritis of right shoulder 11/16/2013   Seasonal allergies     ROS:   All systems reviewed and negative except as noted in the HPI.   Past Surgical History:  Procedure Laterality Date   BREAST EXCISIONAL BIOPSY Left    CARDIOVERSION N/A 08/29/2020   Procedure: CARDIOVERSION;  Surgeon: [name], MD;  Location: [facility];  Service: Cardiovascular;  Laterality: N/A;   CAROTID ENDARTERECTOMY Right    CATARACT EXTRACTION W/ INTRAOCULAR LENS  IMPLANT, BILATERAL     COLON SURGERY     COLON SURGERY  2005   had a colostomy for 6 months    COLONOSCOPY     EYE SURGERY Bilateral    cataracts    GASTRIC RESTRICTION SURGERY Left 2006   TONSILLECTOMY     TOTAL SHOULDER ARTHROPLASTY Left 07/20/2013   Procedure: TOTAL SHOULDER ARTHROPLASTY;  Surgeon: [name], MD;  Location: [place];  Service: Orthopedics;  Laterality: Left;   TOTAL  SHOULDER ARTHROPLASTY Right 11/16/2013   Dr [name]   TOTAL SHOULDER ARTHROPLASTY Right 11/16/2013   Procedure: RIGHT TOTAL SHOULDER ARTHROPLASTY;  Surgeon: [name], MD;  Location: [place];  Service: Orthopedics;  Laterality: Right;     Family History  Problem Relation Age of Onset   Cancer - Lung Mother    Cancer - Other Other      Social History   Socioeconomic History   Marital status: Married    Spouse name: Not on file   Number of children: Not on file   Years of education: Not on file   Highest education level: Not on file  Occupational History   Not on file  Tobacco Use   Smoking status: Former    Packs/day: 0.25    Years: 2.00    Pack years: 0.50    Types: Cigarettes    Quit date: 07/13/1963    Years since quitting: 57.2   Smokeless tobacco: Never   Tobacco comments:    quit aprrox 50  years ago.  Vaping Use   Vaping Use: Never used  Substance and Sexual Activity   Alcohol use: No   Drug use: No   Sexual activity: Not on file  Other Topics Concern   Not on file  Social History Narrative   Not on file   Social Determinants of Health   Financial Resource Strain: Not on file  Food Insecurity: Not on file  Transportation Needs: Not on file  Physical Activity: Not on file  Stress: Not on file  Social Connections: Not on file  Intimate Partner Violence: Not on file     BP 114/68   Pulse (!) 56   Ht '5\' 6"'$  (1.676 m)   Wt 141 lb 3.2 oz (64 kg)   SpO2 95%   BMI 22.79 kg/m   Physical Exam:  Well appearing elderly woman, NAD HEENT: Unremarkable Neck:  No JVD, no thyromegally Lymphatics:  No adenopathy Back:  No CVA tenderness Lungs:  Clear with no wheezes HEART:  Regular rate rhythm, no murmurs, no rubs, no clicks Abd:  soft, positive bowel sounds, no organomegally, no rebound, no guarding Ext:  2 plus pulses, no edema, no cyanosis, no clubbing Skin:  No rashes no nodules Neuro:  CN II through XII intact, motor grossly intact  EKG - atrial fib/flutter with a cvr  Assess/Plan:  Atrial flutter/fib - I have reviewed her ECG's in detail. She has mostly left atrial flutter as demonstrated by positive flutter waves in V1 and the inferior leads. I have recommended she undergo watchful waiting. If her symptoms were to worsen, then amio in low dose would be her best option. Her QT is a little too long for dofetilide. Carotid vascular disease - she is s/p CEA and asymptomatic.  Danielle Overlie Gerarda Conklin,MD

## 2020-09-29 NOTE — Patient Instructions (Addendum)
Medication Instructions:  Your physician recommends that you continue on your current medications as directed. Please refer to the Current Medication list given to you today.  Labwork: None ordered.  Testing/Procedures: None ordered.  Follow-Up: Your physician wants you to follow-up in: as needed with Gregg Taylor, MD    Any Other Special Instructions Will Be Listed Below (If Applicable).  If you need a refill on your cardiac medications before your next appointment, please call your pharmacy.       

## 2020-10-10 DIAGNOSIS — M8580 Other specified disorders of bone density and structure, unspecified site: Secondary | ICD-10-CM | POA: Diagnosis not present

## 2020-10-10 DIAGNOSIS — F418 Other specified anxiety disorders: Secondary | ICD-10-CM | POA: Diagnosis not present

## 2020-10-10 DIAGNOSIS — M1990 Unspecified osteoarthritis, unspecified site: Secondary | ICD-10-CM | POA: Diagnosis not present

## 2020-10-10 DIAGNOSIS — R413 Other amnesia: Secondary | ICD-10-CM | POA: Diagnosis not present

## 2020-10-10 DIAGNOSIS — N1832 Chronic kidney disease, stage 3b: Secondary | ICD-10-CM | POA: Diagnosis not present

## 2020-10-10 DIAGNOSIS — E785 Hyperlipidemia, unspecified: Secondary | ICD-10-CM | POA: Diagnosis not present

## 2020-10-10 DIAGNOSIS — R1031 Right lower quadrant pain: Secondary | ICD-10-CM | POA: Diagnosis not present

## 2020-10-10 DIAGNOSIS — I10 Essential (primary) hypertension: Secondary | ICD-10-CM | POA: Diagnosis not present

## 2020-10-10 DIAGNOSIS — I739 Peripheral vascular disease, unspecified: Secondary | ICD-10-CM | POA: Diagnosis not present

## 2020-10-10 DIAGNOSIS — R42 Dizziness and giddiness: Secondary | ICD-10-CM | POA: Diagnosis not present

## 2020-10-10 DIAGNOSIS — R634 Abnormal weight loss: Secondary | ICD-10-CM | POA: Diagnosis not present

## 2020-10-10 DIAGNOSIS — E039 Hypothyroidism, unspecified: Secondary | ICD-10-CM | POA: Diagnosis not present

## 2020-10-31 ENCOUNTER — Telehealth: Payer: Self-pay | Admitting: Physician Assistant

## 2020-10-31 ENCOUNTER — Telehealth: Payer: Self-pay | Admitting: Licensed Clinical Social Worker

## 2020-10-31 NOTE — Telephone Encounter (Signed)
I was contacted by Danielle [name] about some increased hallucinations and dementia. During times of hallucination and confusion, she has some elevated BP:  155/88 164/92 140/84  Baseline readings are: 116-139 HR 85 139/85 HR 106  I do not think we should change medications at this time and am concerned about trying a PRN medication with baseline pressure as low as 116. She will discuss with Dr. [name] today. They also need some in-home help with medications. I have asked Jenna B. To reach out to SW to help with resources.

## 2020-10-31 NOTE — Telephone Encounter (Signed)
Contacted by [facility] team in regards to some care management needs for pt at home. LCSW attempted to reach pt daughter Letta Moynahan (DPR on file) at [phone]. No answer, message left.   Noted per Angie's, PA, documentation that pt medication management has been a challenge. Recommended in the mean time a referral to [facility] for some pharmacy outreach as pt has [facility] eligible banner. I will f/u again with pt daughter to see if any additional resources can be provided.   Westley Hummer, MSW, [place]  [phone]

## 2020-11-01 DIAGNOSIS — I10 Essential (primary) hypertension: Secondary | ICD-10-CM | POA: Diagnosis not present

## 2020-11-01 DIAGNOSIS — F418 Other specified anxiety disorders: Secondary | ICD-10-CM | POA: Diagnosis not present

## 2020-11-01 DIAGNOSIS — E039 Hypothyroidism, unspecified: Secondary | ICD-10-CM | POA: Diagnosis not present

## 2020-11-01 DIAGNOSIS — I4892 Unspecified atrial flutter: Secondary | ICD-10-CM | POA: Diagnosis not present

## 2020-11-01 DIAGNOSIS — R413 Other amnesia: Secondary | ICD-10-CM | POA: Diagnosis not present

## 2020-11-01 DIAGNOSIS — R350 Frequency of micturition: Secondary | ICD-10-CM | POA: Diagnosis not present

## 2020-11-01 DIAGNOSIS — R309 Painful micturition, unspecified: Secondary | ICD-10-CM | POA: Diagnosis not present

## 2020-11-16 DIAGNOSIS — R309 Painful micturition, unspecified: Secondary | ICD-10-CM | POA: Diagnosis not present

## 2020-11-16 DIAGNOSIS — I4892 Unspecified atrial flutter: Secondary | ICD-10-CM | POA: Diagnosis not present

## 2020-11-16 DIAGNOSIS — E039 Hypothyroidism, unspecified: Secondary | ICD-10-CM | POA: Diagnosis not present

## 2020-11-16 DIAGNOSIS — F418 Other specified anxiety disorders: Secondary | ICD-10-CM | POA: Diagnosis not present

## 2020-11-16 DIAGNOSIS — I10 Essential (primary) hypertension: Secondary | ICD-10-CM | POA: Diagnosis not present

## 2020-11-16 NOTE — Telephone Encounter (Signed)
Per Dr. [name], this was discussed with patient's daughter personally by himself at patients husband's recent OV on 8/23. Social work has also reached out to family. Will close this encounter.

## 2020-12-16 ENCOUNTER — Emergency Department: Payer: Medicare Other

## 2020-12-16 ENCOUNTER — Emergency Department
Admission: EM | Admit: 2020-12-16 | Discharge: 2020-12-16 | Disposition: A | Discharge status: Home / Self Care | Payer: Medicare Other | Attending: Emergency Medicine | Admitting: Emergency Medicine

## 2020-12-16 DIAGNOSIS — J90 Pleural effusion, not elsewhere classified: Secondary | ICD-10-CM | POA: Diagnosis not present

## 2020-12-16 DIAGNOSIS — I4891 Unspecified atrial fibrillation: Secondary | ICD-10-CM | POA: Diagnosis not present

## 2020-12-16 DIAGNOSIS — Y92513 Shop (commercial) as the place of occurrence of the external cause: Secondary | ICD-10-CM | POA: Diagnosis not present

## 2020-12-16 DIAGNOSIS — S0990XA Unspecified injury of head, initial encounter: Secondary | ICD-10-CM

## 2020-12-16 DIAGNOSIS — S0003XA Contusion of scalp, initial encounter: Secondary | ICD-10-CM | POA: Insufficient documentation

## 2020-12-16 DIAGNOSIS — Z87891 Personal history of nicotine dependence: Secondary | ICD-10-CM | POA: Diagnosis not present

## 2020-12-16 DIAGNOSIS — I6529 Occlusion and stenosis of unspecified carotid artery: Secondary | ICD-10-CM | POA: Diagnosis not present

## 2020-12-16 DIAGNOSIS — I10 Essential (primary) hypertension: Secondary | ICD-10-CM | POA: Diagnosis not present

## 2020-12-16 DIAGNOSIS — S199XXA Unspecified injury of neck, initial encounter: Secondary | ICD-10-CM | POA: Diagnosis not present

## 2020-12-16 DIAGNOSIS — M47812 Spondylosis without myelopathy or radiculopathy, cervical region: Secondary | ICD-10-CM | POA: Diagnosis not present

## 2020-12-16 DIAGNOSIS — Z7901 Long term (current) use of anticoagulants: Secondary | ICD-10-CM | POA: Insufficient documentation

## 2020-12-16 DIAGNOSIS — I6789 Other cerebrovascular disease: Secondary | ICD-10-CM | POA: Diagnosis not present

## 2020-12-16 DIAGNOSIS — E039 Hypothyroidism, unspecified: Secondary | ICD-10-CM | POA: Insufficient documentation

## 2020-12-16 DIAGNOSIS — Z96611 Presence of right artificial shoulder joint: Secondary | ICD-10-CM | POA: Insufficient documentation

## 2020-12-16 DIAGNOSIS — J45909 Unspecified asthma, uncomplicated: Secondary | ICD-10-CM | POA: Insufficient documentation

## 2020-12-16 DIAGNOSIS — W01198A Fall on same level from slipping, tripping and stumbling with subsequent striking against other object, initial encounter: Secondary | ICD-10-CM | POA: Insufficient documentation

## 2020-12-16 DIAGNOSIS — Z96612 Presence of left artificial shoulder joint: Secondary | ICD-10-CM | POA: Insufficient documentation

## 2020-12-16 DIAGNOSIS — W19XXXA Unspecified fall, initial encounter: Secondary | ICD-10-CM | POA: Diagnosis not present

## 2020-12-16 DIAGNOSIS — Z79899 Other long term (current) drug therapy: Secondary | ICD-10-CM | POA: Diagnosis not present

## 2020-12-16 NOTE — Discharge Instructions (Signed)
Your CAT scans of your brain and spine did not show any acute injury.  You can ice the swelling on your head.  If you develop worsening headache, nausea vomiting or any numbness or weakness, please return to the emergency department.

## 2020-12-16 NOTE — ED Provider Notes (Signed)
[facility]  ____________________________________________   Event Date/Time   First MD Initiated Contact with Patient 12/16/20 1316     (approximate)  I have reviewed the triage vital signs and the nursing notes.   HISTORY  Chief Complaint Head Injury    HPI Danielle [name] is a 87 y.o. female past medical history of atrial fibrillation on Eliquis who presents after fall.  Patient was shopping and was at the register when she had a fall.  She does not remember any preceding prodrome of lightheadedness dizziness or any symptoms.  She remembers the whole episode and did not lose consciousness.  She hit her head.  She required assistance getting up.  Currently she has a mild headache but denies nausea vomiting or dizziness.  She denies numbness or weakness.  Patient is not exactly sure why she fell.  Her husband witnessed it.  He thinks that she was leaning against the carriage and then slipped causing her to fall.         Past Medical History:  Diagnosis Date   Anxiety    Arthritis    Right shoulder arthritis   Asthma    Atrial fibrillation (HCC)    Carotid artery disease (HCC)    Chronic anticoagulation    Diverticulitis    Dysrhythmia    Headache(784.0)    Heart murmur    Hypothyroidism    Memory impairment    Osteoarthritis of left shoulder 07/20/2013   Peripheral vascular disease ([place])    carotid    Primary osteoarthritis of right shoulder 11/16/2013   Seasonal allergies     Patient Active Problem List   Diagnosis Date Noted   Unspecified atrial flutter ([place])    Acute encephalopathy 11/18/2013   Primary osteoarthritis of right shoulder 11/16/2013   Osteoarthritis of left shoulder 07/20/2013   Primary localized osteoarthrosis, shoulder region 07/20/2013   Palpitations 06/06/2013   Aortic valve sclerosis 06/06/2013   Mitral annular calcification 06/06/2013   Carotid stenosis 10/26/2012   Hyperlipidemia 10/26/2012   Hypothyroidism  10/26/2012   HTN (hypertension) 10/26/2012   S/P cataract surgery 10/26/2012    Past Surgical History:  Procedure Laterality Date   BREAST EXCISIONAL BIOPSY Left    CARDIOVERSION N/A 08/29/2020   Procedure: CARDIOVERSION;  Surgeon: [name], MD;  Location: [place];  Service: Cardiovascular;  Laterality: N/A;   CAROTID ENDARTERECTOMY Right    CATARACT EXTRACTION W/ INTRAOCULAR LENS  IMPLANT, BILATERAL     COLON SURGERY     COLON SURGERY  2005   had a colostomy for 6 months    COLONOSCOPY     EYE SURGERY Bilateral    cataracts    GASTRIC RESTRICTION SURGERY Left 2006   TONSILLECTOMY     TOTAL SHOULDER ARTHROPLASTY Left 07/20/2013   Procedure: TOTAL SHOULDER ARTHROPLASTY;  Surgeon: [name], MD;  Location: [place];  Service: Orthopedics;  Laterality: Left;   TOTAL SHOULDER ARTHROPLASTY Right 11/16/2013   Dr [name]   TOTAL SHOULDER ARTHROPLASTY Right 11/16/2013   Procedure: RIGHT TOTAL SHOULDER ARTHROPLASTY;  Surgeon: [name], MD;  Location: [place];  Service: Orthopedics;  Laterality: Right;    Prior to Admission medications   Medication Sig Start Date End Date Taking? Authorizing Provider  acetaminophen (TYLENOL) 500 MG tablet Take 500 mg by mouth every 6 (six) hours as needed for moderate pain.    [provider]  calcium carbonate (OS-CAL) 600 MG TABS tablet Take 600 mg by mouth daily  with breakfast.     [provider]  cetirizine (ZYRTEC) 10 MG tablet Take 10 mg by mouth daily as needed for allergies.    [provider]  Cholecalciferol (VITAMIN D) 125 MCG (5000 UT) CAPS Take 5,000 Units by mouth every 7 (seven) days.    [provider]  ELIQUIS 5 MG TABS tablet Take 5 mg by mouth 2 (two) times daily. 03/14/20   [provider]  fluticasone (FLONASE) 50 MCG/ACT nasal spray Place 2 sprays into both nostrils daily as needed for allergies. 04/28/13   [provider]  guaiFENesin (MUCINEX) 600 MG 12 hr tablet Take  1,200 mg by mouth daily.    [provider]  hydroxypropyl methylcellulose / hypromellose (ISOPTO TEARS / GONIOVISC) 2.5 % ophthalmic solution Place 1 drop into both eyes 3 (three) times daily as needed for dry eyes.    [provider]  levothyroxine (SYNTHROID) 100 MCG tablet Take 1 tablet (100 mcg total) by mouth daily before breakfast. 09/26/20   [name], MD  Multiple Vitamin (MULTIVITAMIN WITH MINERALS) TABS tablet Take 1 tablet by mouth daily.    [provider]  nebivolol (BYSTOLIC) 10 MG tablet Take 1 tablet (10 mg total) by mouth daily. 08/14/20   [name], MD  rosuvastatin (CRESTOR) 5 MG tablet Take 5 mg by mouth daily.    [provider]    Allergies Patient has no known allergies.  Family History  Problem Relation Age of Onset   Cancer - Lung Mother    Cancer - Other Other     Social History Social History   Tobacco Use   Smoking status: Former    Packs/day: 0.25    Years: 2.00    Pack years: 0.50    Types: Cigarettes    Quit date: 07/13/1963    Years since quitting: 57.4   Smokeless tobacco: Never   Tobacco comments:    quit aprrox 50 years ago.  Vaping Use   Vaping Use: Never used  Substance Use Topics   Alcohol use: No   Drug use: No    Review of Systems   Review of Systems  Eyes:  Negative for visual disturbance.  Respiratory:  Negative for shortness of breath.   Cardiovascular:  Negative for chest pain, palpitations and leg swelling.  Neurological:  Positive for headaches. Negative for syncope, weakness and numbness.  All other systems reviewed and are negative.  Physical Exam Updated Vital Signs BP 135/78   Pulse 79   Temp 98.2 F (36.8 C)   Resp 20   SpO2 98%   Physical Exam Vitals and nursing note reviewed.  Constitutional:      General: She is not in acute distress.    Appearance: Normal appearance.  HENT:     Head: Normocephalic.     Comments: Swelling on the left posterior occiput without  overlying laceration    Nose: Nose normal.  Eyes:     General: No scleral icterus.    Conjunctiva/sclera: Conjunctivae normal.  Cardiovascular:     Rate and Rhythm: Normal rate. Rhythm irregular.  Pulmonary:     Effort: Pulmonary effort is normal. No respiratory distress.     Breath sounds: No stridor.  Abdominal:     General: Abdomen is flat. There is no distension.     Palpations: Abdomen is soft.     Tenderness: There is no abdominal tenderness.  Musculoskeletal:        General: No swelling, tenderness,  deformity or signs of injury.     Cervical back: Normal range of motion and neck supple. No tenderness.     Comments: No C-spine tenderness No chest wall tenderness Pelvis is stable nontender, normal range of motion of the bilateral lower extremities  Skin:    General: Skin is dry.     Coloration: Skin is not jaundiced or pale.  Neurological:     General: No focal deficit present.     Mental Status: She is alert and oriented to person, place, and time. Mental status is at baseline.  Psychiatric:        Mood and Affect: Mood normal.        Behavior: Behavior normal.     LABS (all labs ordered are listed, but only abnormal results are displayed)  Labs Reviewed - No data to display ____________________________________________  EKG  A. fib with normal ventricular response, QT interval read on EKG as significantly prolonged however difficult to determine due to varying ventricular rate, less than half the RR to RR interval ____________________________________________  RADIOLOGY I, Madelin Headings, personally viewed and evaluated these images (plain radiographs) as part of my medical decision making, as well as reviewing the written report by the radiologist.  ED MD interpretation:  I reviewed the CT of the cervical spine which does not show any acute fracture or misalignment  I reviewed the CT scan of the brain which does not show any acute intracranial process       ____________________________________________   PROCEDURES  Procedure(s) performed (including Critical Care):  Procedures   ____________________________________________   INITIAL IMPRESSION / ASSESSMENT AND PLAN / ED COURSE     Patient is an 87 year old female on Eliquis who presents after a fall.  She has an obvious hematoma the back of her head.  CT head and C-spine obtained which do not show any acute fracture or intracranial bleed.  Somewhat unclear why she fell.  Patient really cannot give me a clear history of presyncopal symptoms, but she did not lose consciousness.  Her husband who accompanied her thinks that it was mechanical in nature.  She has no other symptoms at this time.  I obtained an EKG which is read as A. fib QTc interval of 636.  However on my review it is very difficult to see the true QT interval because of the bearing ventricular rate however does look like it is significantly less than half the R to R interval so I do not think that this is the true QTc interval.  She is otherwise asymptomatic.  She is stable for discharge.  Clinical Course as of 12/16/20 1558  Sat Dec 16, 2020  1423  IMPRESSION: 1. No acute intracranial abnormality. 2. Moderate brain parenchymal atrophy and chronic microvascular disease. 3. Large left posterior parietal scalp hematoma.     [KM]    Clinical Course User Index [KM] Rada Hay, MD     ____________________________________________   FINAL CLINICAL IMPRESSION(S) / ED DIAGNOSES  Final diagnoses:  Injury of head, initial encounter     ED Discharge Orders     None        Note:  This document was prepared using Dragon voice recognition software and may include unintentional dictation errors.    Rada Hay, MD 12/16/20 [phone]

## 2020-12-16 NOTE — ED Triage Notes (Signed)
PT arrives via EMS after accidentally falling at the store. Pt struck the back of head. Large hematoma noted. Pt denies LOC but is on Eliquis.

## 2020-12-25 ENCOUNTER — Other Ambulatory Visit (HOSPITAL_BASED_OUTPATIENT_CLINIC_OR_DEPARTMENT_OTHER): Payer: Self-pay | Admitting: Physician Assistant

## 2020-12-25 DIAGNOSIS — I4892 Unspecified atrial flutter: Secondary | ICD-10-CM | POA: Diagnosis not present

## 2020-12-25 DIAGNOSIS — R269 Unspecified abnormalities of gait and mobility: Secondary | ICD-10-CM | POA: Diagnosis not present

## 2020-12-25 DIAGNOSIS — Z9889 Other specified postprocedural states: Secondary | ICD-10-CM

## 2020-12-25 DIAGNOSIS — R2689 Other abnormalities of gait and mobility: Secondary | ICD-10-CM | POA: Diagnosis not present

## 2020-12-25 DIAGNOSIS — W19XXXA Unspecified fall, initial encounter: Secondary | ICD-10-CM | POA: Diagnosis not present

## 2020-12-25 DIAGNOSIS — S060X0A Concussion without loss of consciousness, initial encounter: Secondary | ICD-10-CM | POA: Diagnosis not present

## 2021-01-01 DIAGNOSIS — Z96611 Presence of right artificial shoulder joint: Secondary | ICD-10-CM | POA: Diagnosis not present

## 2021-01-01 DIAGNOSIS — I129 Hypertensive chronic kidney disease with stage 1 through stage 4 chronic kidney disease, or unspecified chronic kidney disease: Secondary | ICD-10-CM | POA: Diagnosis not present

## 2021-01-01 DIAGNOSIS — F32A Depression, unspecified: Secondary | ICD-10-CM | POA: Diagnosis not present

## 2021-01-01 DIAGNOSIS — N3281 Overactive bladder: Secondary | ICD-10-CM | POA: Diagnosis not present

## 2021-01-01 DIAGNOSIS — K5790 Diverticulosis of intestine, part unspecified, without perforation or abscess without bleeding: Secondary | ICD-10-CM | POA: Diagnosis not present

## 2021-01-01 DIAGNOSIS — I4892 Unspecified atrial flutter: Secondary | ICD-10-CM | POA: Diagnosis not present

## 2021-01-01 DIAGNOSIS — M8580 Other specified disorders of bone density and structure, unspecified site: Secondary | ICD-10-CM | POA: Diagnosis not present

## 2021-01-01 DIAGNOSIS — M1990 Unspecified osteoarthritis, unspecified site: Secondary | ICD-10-CM | POA: Diagnosis not present

## 2021-01-01 DIAGNOSIS — N1832 Chronic kidney disease, stage 3b: Secondary | ICD-10-CM | POA: Diagnosis not present

## 2021-01-01 DIAGNOSIS — F418 Other specified anxiety disorders: Secondary | ICD-10-CM | POA: Diagnosis not present

## 2021-01-01 DIAGNOSIS — M5416 Radiculopathy, lumbar region: Secondary | ICD-10-CM | POA: Diagnosis not present

## 2021-01-01 DIAGNOSIS — I739 Peripheral vascular disease, unspecified: Secondary | ICD-10-CM | POA: Diagnosis not present

## 2021-01-01 DIAGNOSIS — Z8616 Personal history of COVID-19: Secondary | ICD-10-CM | POA: Diagnosis not present

## 2021-01-01 DIAGNOSIS — G43709 Chronic migraine without aura, not intractable, without status migrainosus: Secondary | ICD-10-CM | POA: Diagnosis not present

## 2021-01-01 DIAGNOSIS — J45909 Unspecified asthma, uncomplicated: Secondary | ICD-10-CM | POA: Diagnosis not present

## 2021-01-01 DIAGNOSIS — S060X0D Concussion without loss of consciousness, subsequent encounter: Secondary | ICD-10-CM | POA: Diagnosis not present

## 2021-01-01 DIAGNOSIS — Z9181 History of falling: Secondary | ICD-10-CM | POA: Diagnosis not present

## 2021-01-01 DIAGNOSIS — E039 Hypothyroidism, unspecified: Secondary | ICD-10-CM | POA: Diagnosis not present

## 2021-01-01 DIAGNOSIS — Z7901 Long term (current) use of anticoagulants: Secondary | ICD-10-CM | POA: Diagnosis not present

## 2021-01-01 DIAGNOSIS — E785 Hyperlipidemia, unspecified: Secondary | ICD-10-CM | POA: Diagnosis not present

## 2021-01-01 DIAGNOSIS — Z96612 Presence of left artificial shoulder joint: Secondary | ICD-10-CM | POA: Diagnosis not present

## 2021-01-06 DIAGNOSIS — Z23 Encounter for immunization: Secondary | ICD-10-CM | POA: Diagnosis not present

## 2021-01-09 DIAGNOSIS — J45909 Unspecified asthma, uncomplicated: Secondary | ICD-10-CM | POA: Diagnosis not present

## 2021-01-09 DIAGNOSIS — I739 Peripheral vascular disease, unspecified: Secondary | ICD-10-CM | POA: Diagnosis not present

## 2021-01-09 DIAGNOSIS — S060X0D Concussion without loss of consciousness, subsequent encounter: Secondary | ICD-10-CM | POA: Diagnosis not present

## 2021-01-09 DIAGNOSIS — N1832 Chronic kidney disease, stage 3b: Secondary | ICD-10-CM | POA: Diagnosis not present

## 2021-01-09 DIAGNOSIS — I129 Hypertensive chronic kidney disease with stage 1 through stage 4 chronic kidney disease, or unspecified chronic kidney disease: Secondary | ICD-10-CM | POA: Diagnosis not present

## 2021-01-09 DIAGNOSIS — I4892 Unspecified atrial flutter: Secondary | ICD-10-CM | POA: Diagnosis not present

## 2021-01-11 DIAGNOSIS — I4892 Unspecified atrial flutter: Secondary | ICD-10-CM | POA: Diagnosis not present

## 2021-01-11 DIAGNOSIS — I129 Hypertensive chronic kidney disease with stage 1 through stage 4 chronic kidney disease, or unspecified chronic kidney disease: Secondary | ICD-10-CM | POA: Diagnosis not present

## 2021-01-11 DIAGNOSIS — S060X0D Concussion without loss of consciousness, subsequent encounter: Secondary | ICD-10-CM | POA: Diagnosis not present

## 2021-01-11 DIAGNOSIS — J45909 Unspecified asthma, uncomplicated: Secondary | ICD-10-CM | POA: Diagnosis not present

## 2021-01-11 DIAGNOSIS — N1832 Chronic kidney disease, stage 3b: Secondary | ICD-10-CM | POA: Diagnosis not present

## 2021-01-11 DIAGNOSIS — I739 Peripheral vascular disease, unspecified: Secondary | ICD-10-CM | POA: Diagnosis not present

## 2021-01-16 DIAGNOSIS — J45909 Unspecified asthma, uncomplicated: Secondary | ICD-10-CM | POA: Diagnosis not present

## 2021-01-16 DIAGNOSIS — N1832 Chronic kidney disease, stage 3b: Secondary | ICD-10-CM | POA: Diagnosis not present

## 2021-01-16 DIAGNOSIS — I739 Peripheral vascular disease, unspecified: Secondary | ICD-10-CM | POA: Diagnosis not present

## 2021-01-16 DIAGNOSIS — I4892 Unspecified atrial flutter: Secondary | ICD-10-CM | POA: Diagnosis not present

## 2021-01-16 DIAGNOSIS — I129 Hypertensive chronic kidney disease with stage 1 through stage 4 chronic kidney disease, or unspecified chronic kidney disease: Secondary | ICD-10-CM | POA: Diagnosis not present

## 2021-01-16 DIAGNOSIS — S060X0D Concussion without loss of consciousness, subsequent encounter: Secondary | ICD-10-CM | POA: Diagnosis not present

## 2021-01-18 DIAGNOSIS — J45909 Unspecified asthma, uncomplicated: Secondary | ICD-10-CM | POA: Diagnosis not present

## 2021-01-18 DIAGNOSIS — S060X0D Concussion without loss of consciousness, subsequent encounter: Secondary | ICD-10-CM | POA: Diagnosis not present

## 2021-01-18 DIAGNOSIS — N1832 Chronic kidney disease, stage 3b: Secondary | ICD-10-CM | POA: Diagnosis not present

## 2021-01-18 DIAGNOSIS — I4892 Unspecified atrial flutter: Secondary | ICD-10-CM | POA: Diagnosis not present

## 2021-01-18 DIAGNOSIS — I739 Peripheral vascular disease, unspecified: Secondary | ICD-10-CM | POA: Diagnosis not present

## 2021-01-18 DIAGNOSIS — I129 Hypertensive chronic kidney disease with stage 1 through stage 4 chronic kidney disease, or unspecified chronic kidney disease: Secondary | ICD-10-CM | POA: Diagnosis not present

## 2021-01-22 DIAGNOSIS — N1832 Chronic kidney disease, stage 3b: Secondary | ICD-10-CM | POA: Diagnosis not present

## 2021-01-22 DIAGNOSIS — I129 Hypertensive chronic kidney disease with stage 1 through stage 4 chronic kidney disease, or unspecified chronic kidney disease: Secondary | ICD-10-CM | POA: Diagnosis not present

## 2021-01-22 DIAGNOSIS — J45909 Unspecified asthma, uncomplicated: Secondary | ICD-10-CM | POA: Diagnosis not present

## 2021-01-22 DIAGNOSIS — S060X0D Concussion without loss of consciousness, subsequent encounter: Secondary | ICD-10-CM | POA: Diagnosis not present

## 2021-01-22 DIAGNOSIS — I4892 Unspecified atrial flutter: Secondary | ICD-10-CM | POA: Diagnosis not present

## 2021-01-22 DIAGNOSIS — I739 Peripheral vascular disease, unspecified: Secondary | ICD-10-CM | POA: Diagnosis not present

## 2021-01-26 DIAGNOSIS — I4892 Unspecified atrial flutter: Secondary | ICD-10-CM | POA: Diagnosis not present

## 2021-01-26 DIAGNOSIS — J45909 Unspecified asthma, uncomplicated: Secondary | ICD-10-CM | POA: Diagnosis not present

## 2021-01-26 DIAGNOSIS — N1832 Chronic kidney disease, stage 3b: Secondary | ICD-10-CM | POA: Diagnosis not present

## 2021-01-26 DIAGNOSIS — I739 Peripheral vascular disease, unspecified: Secondary | ICD-10-CM | POA: Diagnosis not present

## 2021-01-26 DIAGNOSIS — I129 Hypertensive chronic kidney disease with stage 1 through stage 4 chronic kidney disease, or unspecified chronic kidney disease: Secondary | ICD-10-CM | POA: Diagnosis not present

## 2021-01-26 DIAGNOSIS — S060X0D Concussion without loss of consciousness, subsequent encounter: Secondary | ICD-10-CM | POA: Diagnosis not present

## 2021-01-31 DIAGNOSIS — M1990 Unspecified osteoarthritis, unspecified site: Secondary | ICD-10-CM | POA: Diagnosis not present

## 2021-01-31 DIAGNOSIS — N3281 Overactive bladder: Secondary | ICD-10-CM | POA: Diagnosis not present

## 2021-01-31 DIAGNOSIS — F418 Other specified anxiety disorders: Secondary | ICD-10-CM | POA: Diagnosis not present

## 2021-01-31 DIAGNOSIS — Z96611 Presence of right artificial shoulder joint: Secondary | ICD-10-CM | POA: Diagnosis not present

## 2021-01-31 DIAGNOSIS — M5416 Radiculopathy, lumbar region: Secondary | ICD-10-CM | POA: Diagnosis not present

## 2021-01-31 DIAGNOSIS — G43709 Chronic migraine without aura, not intractable, without status migrainosus: Secondary | ICD-10-CM | POA: Diagnosis not present

## 2021-01-31 DIAGNOSIS — Z8616 Personal history of COVID-19: Secondary | ICD-10-CM | POA: Diagnosis not present

## 2021-01-31 DIAGNOSIS — F32A Depression, unspecified: Secondary | ICD-10-CM | POA: Diagnosis not present

## 2021-01-31 DIAGNOSIS — J45909 Unspecified asthma, uncomplicated: Secondary | ICD-10-CM | POA: Diagnosis not present

## 2021-01-31 DIAGNOSIS — I4892 Unspecified atrial flutter: Secondary | ICD-10-CM | POA: Diagnosis not present

## 2021-01-31 DIAGNOSIS — K5790 Diverticulosis of intestine, part unspecified, without perforation or abscess without bleeding: Secondary | ICD-10-CM | POA: Diagnosis not present

## 2021-01-31 DIAGNOSIS — Z7901 Long term (current) use of anticoagulants: Secondary | ICD-10-CM | POA: Diagnosis not present

## 2021-01-31 DIAGNOSIS — E785 Hyperlipidemia, unspecified: Secondary | ICD-10-CM | POA: Diagnosis not present

## 2021-01-31 DIAGNOSIS — E039 Hypothyroidism, unspecified: Secondary | ICD-10-CM | POA: Diagnosis not present

## 2021-01-31 DIAGNOSIS — Z9181 History of falling: Secondary | ICD-10-CM | POA: Diagnosis not present

## 2021-01-31 DIAGNOSIS — I739 Peripheral vascular disease, unspecified: Secondary | ICD-10-CM | POA: Diagnosis not present

## 2021-01-31 DIAGNOSIS — I129 Hypertensive chronic kidney disease with stage 1 through stage 4 chronic kidney disease, or unspecified chronic kidney disease: Secondary | ICD-10-CM | POA: Diagnosis not present

## 2021-01-31 DIAGNOSIS — Z96612 Presence of left artificial shoulder joint: Secondary | ICD-10-CM | POA: Diagnosis not present

## 2021-01-31 DIAGNOSIS — M8580 Other specified disorders of bone density and structure, unspecified site: Secondary | ICD-10-CM | POA: Diagnosis not present

## 2021-01-31 DIAGNOSIS — N1832 Chronic kidney disease, stage 3b: Secondary | ICD-10-CM | POA: Diagnosis not present

## 2021-01-31 DIAGNOSIS — S060X0D Concussion without loss of consciousness, subsequent encounter: Secondary | ICD-10-CM | POA: Diagnosis not present

## 2021-02-06 DIAGNOSIS — I129 Hypertensive chronic kidney disease with stage 1 through stage 4 chronic kidney disease, or unspecified chronic kidney disease: Secondary | ICD-10-CM | POA: Diagnosis not present

## 2021-02-06 DIAGNOSIS — I739 Peripheral vascular disease, unspecified: Secondary | ICD-10-CM | POA: Diagnosis not present

## 2021-02-06 DIAGNOSIS — I4892 Unspecified atrial flutter: Secondary | ICD-10-CM | POA: Diagnosis not present

## 2021-02-06 DIAGNOSIS — N1832 Chronic kidney disease, stage 3b: Secondary | ICD-10-CM | POA: Diagnosis not present

## 2021-02-06 DIAGNOSIS — S060X0D Concussion without loss of consciousness, subsequent encounter: Secondary | ICD-10-CM | POA: Diagnosis not present

## 2021-02-06 DIAGNOSIS — J45909 Unspecified asthma, uncomplicated: Secondary | ICD-10-CM | POA: Diagnosis not present

## 2021-02-16 DIAGNOSIS — I739 Peripheral vascular disease, unspecified: Secondary | ICD-10-CM | POA: Diagnosis not present

## 2021-02-16 DIAGNOSIS — J45909 Unspecified asthma, uncomplicated: Secondary | ICD-10-CM | POA: Diagnosis not present

## 2021-02-16 DIAGNOSIS — N1832 Chronic kidney disease, stage 3b: Secondary | ICD-10-CM | POA: Diagnosis not present

## 2021-02-16 DIAGNOSIS — I129 Hypertensive chronic kidney disease with stage 1 through stage 4 chronic kidney disease, or unspecified chronic kidney disease: Secondary | ICD-10-CM | POA: Diagnosis not present

## 2021-02-16 DIAGNOSIS — S060X0D Concussion without loss of consciousness, subsequent encounter: Secondary | ICD-10-CM | POA: Diagnosis not present

## 2021-02-16 DIAGNOSIS — I4892 Unspecified atrial flutter: Secondary | ICD-10-CM | POA: Diagnosis not present

## 2021-02-22 DIAGNOSIS — I129 Hypertensive chronic kidney disease with stage 1 through stage 4 chronic kidney disease, or unspecified chronic kidney disease: Secondary | ICD-10-CM | POA: Diagnosis not present

## 2021-02-22 DIAGNOSIS — J45909 Unspecified asthma, uncomplicated: Secondary | ICD-10-CM | POA: Diagnosis not present

## 2021-02-22 DIAGNOSIS — I4892 Unspecified atrial flutter: Secondary | ICD-10-CM | POA: Diagnosis not present

## 2021-02-22 DIAGNOSIS — N1832 Chronic kidney disease, stage 3b: Secondary | ICD-10-CM | POA: Diagnosis not present

## 2021-02-22 DIAGNOSIS — I739 Peripheral vascular disease, unspecified: Secondary | ICD-10-CM | POA: Diagnosis not present

## 2021-02-22 DIAGNOSIS — S060X0D Concussion without loss of consciousness, subsequent encounter: Secondary | ICD-10-CM | POA: Diagnosis not present

## 2021-03-01 DIAGNOSIS — J45909 Unspecified asthma, uncomplicated: Secondary | ICD-10-CM | POA: Diagnosis not present

## 2021-03-01 DIAGNOSIS — S060X0D Concussion without loss of consciousness, subsequent encounter: Secondary | ICD-10-CM | POA: Diagnosis not present

## 2021-03-01 DIAGNOSIS — N1832 Chronic kidney disease, stage 3b: Secondary | ICD-10-CM | POA: Diagnosis not present

## 2021-03-01 DIAGNOSIS — I739 Peripheral vascular disease, unspecified: Secondary | ICD-10-CM | POA: Diagnosis not present

## 2021-03-01 DIAGNOSIS — I129 Hypertensive chronic kidney disease with stage 1 through stage 4 chronic kidney disease, or unspecified chronic kidney disease: Secondary | ICD-10-CM | POA: Diagnosis not present

## 2021-03-01 DIAGNOSIS — I4892 Unspecified atrial flutter: Secondary | ICD-10-CM | POA: Diagnosis not present

## 2021-03-30 DIAGNOSIS — E785 Hyperlipidemia, unspecified: Secondary | ICD-10-CM | POA: Diagnosis not present

## 2021-03-30 DIAGNOSIS — I10 Essential (primary) hypertension: Secondary | ICD-10-CM | POA: Diagnosis not present

## 2021-03-30 DIAGNOSIS — E039 Hypothyroidism, unspecified: Secondary | ICD-10-CM | POA: Diagnosis not present

## 2021-03-30 DIAGNOSIS — M859 Disorder of bone density and structure, unspecified: Secondary | ICD-10-CM | POA: Diagnosis not present

## 2021-04-06 DIAGNOSIS — E785 Hyperlipidemia, unspecified: Secondary | ICD-10-CM | POA: Diagnosis not present

## 2021-04-06 DIAGNOSIS — R413 Other amnesia: Secondary | ICD-10-CM | POA: Diagnosis not present

## 2021-04-06 DIAGNOSIS — M8580 Other specified disorders of bone density and structure, unspecified site: Secondary | ICD-10-CM | POA: Diagnosis not present

## 2021-04-06 DIAGNOSIS — I739 Peripheral vascular disease, unspecified: Secondary | ICD-10-CM | POA: Diagnosis not present

## 2021-04-06 DIAGNOSIS — I129 Hypertensive chronic kidney disease with stage 1 through stage 4 chronic kidney disease, or unspecified chronic kidney disease: Secondary | ICD-10-CM | POA: Diagnosis not present

## 2021-04-06 DIAGNOSIS — R269 Unspecified abnormalities of gait and mobility: Secondary | ICD-10-CM | POA: Diagnosis not present

## 2021-04-06 DIAGNOSIS — R82998 Other abnormal findings in urine: Secondary | ICD-10-CM | POA: Diagnosis not present

## 2021-04-06 DIAGNOSIS — I4892 Unspecified atrial flutter: Secondary | ICD-10-CM | POA: Diagnosis not present

## 2021-04-06 DIAGNOSIS — Z1331 Encounter for screening for depression: Secondary | ICD-10-CM | POA: Diagnosis not present

## 2021-04-06 DIAGNOSIS — N1832 Chronic kidney disease, stage 3b: Secondary | ICD-10-CM | POA: Diagnosis not present

## 2021-04-06 DIAGNOSIS — F418 Other specified anxiety disorders: Secondary | ICD-10-CM | POA: Diagnosis not present

## 2021-04-06 DIAGNOSIS — G43709 Chronic migraine without aura, not intractable, without status migrainosus: Secondary | ICD-10-CM | POA: Diagnosis not present

## 2021-04-06 DIAGNOSIS — Z1339 Encounter for screening examination for other mental health and behavioral disorders: Secondary | ICD-10-CM | POA: Diagnosis not present

## 2021-04-06 DIAGNOSIS — Z0000 Encounter for general adult medical examination without abnormal findings: Secondary | ICD-10-CM | POA: Diagnosis not present

## 2021-04-06 DIAGNOSIS — E039 Hypothyroidism, unspecified: Secondary | ICD-10-CM | POA: Diagnosis not present

## 2021-04-19 ENCOUNTER — Inpatient Hospital Stay (HOSPITAL_COMMUNITY)
Admission: EM | Admit: 2021-04-19 | Discharge: 2021-05-09 | DRG: 082 | Disposition: E | Payer: Medicare Other | Attending: Surgery | Admitting: Surgery

## 2021-04-19 ENCOUNTER — Encounter (HOSPITAL_COMMUNITY): Payer: Self-pay | Admitting: Emergency Medicine

## 2021-04-19 ENCOUNTER — Emergency Department (HOSPITAL_COMMUNITY): Payer: Medicare Other

## 2021-04-19 DIAGNOSIS — Z515 Encounter for palliative care: Secondary | ICD-10-CM | POA: Diagnosis not present

## 2021-04-19 DIAGNOSIS — R4701 Aphasia: Secondary | ICD-10-CM | POA: Diagnosis not present

## 2021-04-19 DIAGNOSIS — Z7901 Long term (current) use of anticoagulants: Secondary | ICD-10-CM

## 2021-04-19 DIAGNOSIS — I739 Peripheral vascular disease, unspecified: Secondary | ICD-10-CM | POA: Diagnosis not present

## 2021-04-19 DIAGNOSIS — I10 Essential (primary) hypertension: Secondary | ICD-10-CM | POA: Diagnosis present

## 2021-04-19 DIAGNOSIS — R4182 Altered mental status, unspecified: Secondary | ICD-10-CM

## 2021-04-19 DIAGNOSIS — R296 Repeated falls: Secondary | ICD-10-CM | POA: Diagnosis present

## 2021-04-19 DIAGNOSIS — E43 Unspecified severe protein-calorie malnutrition: Secondary | ICD-10-CM | POA: Diagnosis present

## 2021-04-19 DIAGNOSIS — I4892 Unspecified atrial flutter: Secondary | ICD-10-CM | POA: Diagnosis present

## 2021-04-19 DIAGNOSIS — Y92008 Other place in unspecified non-institutional (private) residence as the place of occurrence of the external cause: Secondary | ICD-10-CM

## 2021-04-19 DIAGNOSIS — I4819 Other persistent atrial fibrillation: Secondary | ICD-10-CM | POA: Diagnosis present

## 2021-04-19 DIAGNOSIS — Z4659 Encounter for fitting and adjustment of other gastrointestinal appliance and device: Secondary | ICD-10-CM

## 2021-04-19 DIAGNOSIS — Z66 Do not resuscitate: Secondary | ICD-10-CM | POA: Diagnosis present

## 2021-04-19 DIAGNOSIS — Z96612 Presence of left artificial shoulder joint: Secondary | ICD-10-CM | POA: Diagnosis present

## 2021-04-19 DIAGNOSIS — S066XAA Traumatic subarachnoid hemorrhage with loss of consciousness status unknown, initial encounter: Secondary | ICD-10-CM | POA: Diagnosis present

## 2021-04-19 DIAGNOSIS — E785 Hyperlipidemia, unspecified: Secondary | ICD-10-CM | POA: Diagnosis not present

## 2021-04-19 DIAGNOSIS — Z79899 Other long term (current) drug therapy: Secondary | ICD-10-CM | POA: Diagnosis not present

## 2021-04-19 DIAGNOSIS — Z6821 Body mass index (BMI) 21.0-21.9, adult: Secondary | ICD-10-CM

## 2021-04-19 DIAGNOSIS — S0003XA Contusion of scalp, initial encounter: Secondary | ICD-10-CM | POA: Diagnosis not present

## 2021-04-19 DIAGNOSIS — Z96611 Presence of right artificial shoulder joint: Secondary | ICD-10-CM | POA: Diagnosis present

## 2021-04-19 DIAGNOSIS — Z7189 Other specified counseling: Secondary | ICD-10-CM | POA: Diagnosis not present

## 2021-04-19 DIAGNOSIS — W108XXA Fall (on) (from) other stairs and steps, initial encounter: Secondary | ICD-10-CM | POA: Diagnosis present

## 2021-04-19 DIAGNOSIS — I441 Atrioventricular block, second degree: Secondary | ICD-10-CM | POA: Diagnosis not present

## 2021-04-19 DIAGNOSIS — M19011 Primary osteoarthritis, right shoulder: Secondary | ICD-10-CM | POA: Diagnosis present

## 2021-04-19 DIAGNOSIS — S02119A Unspecified fracture of occiput, initial encounter for closed fracture: Secondary | ICD-10-CM | POA: Diagnosis present

## 2021-04-19 DIAGNOSIS — F0390 Unspecified dementia without behavioral disturbance: Secondary | ICD-10-CM | POA: Diagnosis not present

## 2021-04-19 DIAGNOSIS — Z20822 Contact with and (suspected) exposure to covid-19: Secondary | ICD-10-CM | POA: Diagnosis present

## 2021-04-19 DIAGNOSIS — E039 Hypothyroidism, unspecified: Secondary | ICD-10-CM | POA: Diagnosis not present

## 2021-04-19 DIAGNOSIS — S0291XA Unspecified fracture of skull, initial encounter for closed fracture: Secondary | ICD-10-CM | POA: Diagnosis not present

## 2021-04-19 DIAGNOSIS — S065X0A Traumatic subdural hemorrhage without loss of consciousness, initial encounter: Secondary | ICD-10-CM | POA: Diagnosis not present

## 2021-04-19 DIAGNOSIS — S065XAA Traumatic subdural hemorrhage with loss of consciousness status unknown, initial encounter: Secondary | ICD-10-CM | POA: Diagnosis not present

## 2021-04-19 DIAGNOSIS — W19XXXA Unspecified fall, initial encounter: Secondary | ICD-10-CM | POA: Diagnosis not present

## 2021-04-19 DIAGNOSIS — Z79890 Hormone replacement therapy (postmenopausal): Secondary | ICD-10-CM

## 2021-04-19 DIAGNOSIS — E876 Hypokalemia: Secondary | ICD-10-CM | POA: Diagnosis present

## 2021-04-19 DIAGNOSIS — S199XXA Unspecified injury of neck, initial encounter: Secondary | ICD-10-CM | POA: Diagnosis not present

## 2021-04-19 DIAGNOSIS — I2510 Atherosclerotic heart disease of native coronary artery without angina pectoris: Secondary | ICD-10-CM | POA: Diagnosis present

## 2021-04-19 DIAGNOSIS — D6869 Other thrombophilia: Secondary | ICD-10-CM | POA: Diagnosis not present

## 2021-04-19 DIAGNOSIS — S066X0A Traumatic subarachnoid hemorrhage without loss of consciousness, initial encounter: Secondary | ICD-10-CM | POA: Diagnosis not present

## 2021-04-19 DIAGNOSIS — S066X9A Traumatic subarachnoid hemorrhage with loss of consciousness of unspecified duration, initial encounter: Secondary | ICD-10-CM | POA: Diagnosis not present

## 2021-04-19 DIAGNOSIS — S0211HA Other fracture of occiput, left side, initial encounter for closed fracture: Secondary | ICD-10-CM | POA: Diagnosis not present

## 2021-04-19 DIAGNOSIS — G8191 Hemiplegia, unspecified affecting right dominant side: Secondary | ICD-10-CM | POA: Diagnosis present

## 2021-04-19 DIAGNOSIS — Z87891 Personal history of nicotine dependence: Secondary | ICD-10-CM

## 2021-04-19 DIAGNOSIS — M19012 Primary osteoarthritis, left shoulder: Secondary | ICD-10-CM | POA: Diagnosis present

## 2021-04-19 DIAGNOSIS — S0211GA Other fracture of occiput, right side, initial encounter for closed fracture: Secondary | ICD-10-CM | POA: Diagnosis not present

## 2021-04-19 DIAGNOSIS — S065X9A Traumatic subdural hemorrhage with loss of consciousness of unspecified duration, initial encounter: Secondary | ICD-10-CM | POA: Diagnosis not present

## 2021-04-19 DIAGNOSIS — I6200 Nontraumatic subdural hemorrhage, unspecified: Secondary | ICD-10-CM | POA: Diagnosis not present

## 2021-04-19 DIAGNOSIS — S299XXA Unspecified injury of thorax, initial encounter: Secondary | ICD-10-CM | POA: Diagnosis not present

## 2021-04-19 DIAGNOSIS — Z4682 Encounter for fitting and adjustment of non-vascular catheter: Secondary | ICD-10-CM | POA: Diagnosis not present

## 2021-04-19 DIAGNOSIS — R000 Tachycardia, unspecified: Secondary | ICD-10-CM | POA: Diagnosis not present

## 2021-04-19 DIAGNOSIS — S06899A Other specified intracranial injury with loss of consciousness of unspecified duration, initial encounter: Secondary | ICD-10-CM | POA: Diagnosis not present

## 2021-04-19 DIAGNOSIS — Z9049 Acquired absence of other specified parts of digestive tract: Secondary | ICD-10-CM

## 2021-04-19 DIAGNOSIS — R40242 Glasgow coma scale score 9-12, unspecified time: Secondary | ICD-10-CM | POA: Diagnosis present

## 2021-04-19 DIAGNOSIS — I609 Nontraumatic subarachnoid hemorrhage, unspecified: Secondary | ICD-10-CM

## 2021-04-19 LAB — CBC
HCT: 41.9 % (ref 36.0–46.0)
Hemoglobin: 14.2 g/dL (ref 12.0–15.0)
MCH: 32.3 pg (ref 26.0–34.0)
MCHC: 33.9 g/dL (ref 30.0–36.0)
MCV: 95.4 fL (ref 80.0–100.0)
Platelets: 103 10*3/uL — ABNORMAL LOW (ref 150–400)
RBC: 4.39 MIL/uL (ref 3.87–5.11)
RDW: 15.2 % (ref 11.5–15.5)
WBC: 6.4 10*3/uL (ref 4.0–10.5)
nRBC: 0 % (ref 0.0–0.2)

## 2021-04-19 LAB — URINALYSIS, ROUTINE W REFLEX MICROSCOPIC
Bacteria, UA: NONE SEEN
Bilirubin Urine: NEGATIVE
Glucose, UA: NEGATIVE mg/dL
Ketones, ur: 20 mg/dL — AB
Leukocytes,Ua: NEGATIVE
Nitrite: NEGATIVE
Protein, ur: NEGATIVE mg/dL
Specific Gravity, Urine: 1.01 (ref 1.005–1.030)
pH: 6 (ref 5.0–8.0)

## 2021-04-19 LAB — COMPREHENSIVE METABOLIC PANEL WITH GFR
ALT: 33 U/L (ref 0–44)
AST: 59 U/L — ABNORMAL HIGH (ref 15–41)
Albumin: 3.1 g/dL — ABNORMAL LOW (ref 3.5–5.0)
Alkaline Phosphatase: 154 U/L — ABNORMAL HIGH (ref 38–126)
Anion gap: 9 (ref 5–15)
BUN: 15 mg/dL (ref 8–23)
CO2: 23 mmol/L (ref 22–32)
Calcium: 8.8 mg/dL — ABNORMAL LOW (ref 8.9–10.3)
Chloride: 106 mmol/L (ref 98–111)
Creatinine, Ser: 0.9 mg/dL (ref 0.44–1.00)
GFR, Estimated: >60 mL/min
Glucose, Bld: 122 mg/dL — ABNORMAL HIGH (ref 70–99)
Potassium: 3.6 mmol/L (ref 3.5–5.1)
Sodium: 138 mmol/L (ref 135–145)
Total Bilirubin: 2 mg/dL — ABNORMAL HIGH (ref 0.3–1.2)
Total Protein: 6.5 g/dL (ref 6.5–8.1)

## 2021-04-19 LAB — I-STAT CHEM 8, ED
BUN: 16 mg/dL (ref 8–23)
Calcium, Ion: 1.04 mmol/L — ABNORMAL LOW (ref 1.15–1.40)
Chloride: 106 mmol/L (ref 98–111)
Creatinine, Ser: 0.8 mg/dL (ref 0.44–1.00)
Glucose, Bld: 118 mg/dL — ABNORMAL HIGH (ref 70–99)
HCT: 44 % (ref 36.0–46.0)
Hemoglobin: 15 g/dL (ref 12.0–15.0)
Potassium: 3.7 mmol/L (ref 3.5–5.1)
Sodium: 140 mmol/L (ref 135–145)
TCO2: 23 mmol/L (ref 22–32)

## 2021-04-19 LAB — LACTIC ACID, PLASMA: Lactic Acid, Venous: 1.9 mmol/L (ref 0.5–1.9)

## 2021-04-19 LAB — RESP PANEL BY RT-PCR (FLU A&B, COVID) ARPGX2
Influenza A by PCR: NEGATIVE
Influenza B by PCR: NEGATIVE
SARS Coronavirus 2 by RT PCR: NEGATIVE

## 2021-04-19 LAB — SAMPLE TO BLOOD BANK

## 2021-04-19 LAB — ETHANOL: Alcohol, Ethyl (B): <10 mg/dL

## 2021-04-19 LAB — MRSA NEXT GEN BY PCR, NASAL: MRSA by PCR Next Gen: NOT DETECTED

## 2021-04-19 MED ORDER — CHLORHEXIDINE GLUCONATE 0.12 % MT SOLN
15.0000 mL | Freq: Two times a day (BID) | OROMUCOSAL | Status: DC
  Administered 2021-04-20 – 2021-04-22 (×7): 15 mL via OROMUCOSAL
  Filled 2021-04-19 (×5): qty 15

## 2021-04-19 MED ORDER — AMIODARONE LOAD VIA INFUSION
150.0000 mg | Freq: Once | INTRAVENOUS | Status: AC
  Administered 2021-04-19: 150 mg via INTRAVENOUS
  Filled 2021-04-19 (×2): qty 83.34

## 2021-04-19 MED ORDER — ONDANSETRON HCL 4 MG/2ML IJ SOLN: 4.0000 mg | Freq: Four times a day (QID) | INTRAMUSCULAR | Status: DC | PRN

## 2021-04-19 MED ORDER — POTASSIUM CHLORIDE IN NACL 20-0.9 MEQ/L-% IV SOLN
INTRAVENOUS | Status: DC
  Filled 2021-04-19 (×4): qty 1000

## 2021-04-19 MED ORDER — MORPHINE SULFATE (PF) 2 MG/ML IV SOLN
2.0000 mg | INTRAVENOUS | Status: DC | PRN
  Administered 2021-04-20: 2 mg via INTRAVENOUS
  Filled 2021-04-19: qty 1

## 2021-04-19 MED ORDER — EMPTY CONTAINERS FLEXIBLE MISC
900.0000 mg | Freq: Once | Status: AC
  Administered 2021-04-19: 900 mg via INTRAVENOUS
  Filled 2021-04-19 (×2): qty 90

## 2021-04-19 MED ORDER — HYPROMELLOSE (GONIOSCOPIC) 2.5 % OP SOLN
1.0000 [drp] | Freq: Three times a day (TID) | OPHTHALMIC | Status: DC | PRN
  Filled 2021-04-19: qty 15

## 2021-04-19 MED ORDER — ONDANSETRON HCL 4 MG/2ML IJ SOLN
4.0000 mg | Freq: Once | INTRAMUSCULAR | Status: AC
  Administered 2021-04-19: 4 mg via INTRAVENOUS
  Filled 2021-04-19: qty 2

## 2021-04-19 MED ORDER — PANTOPRAZOLE SODIUM 40 MG PO TBEC
40.0000 mg | DELAYED_RELEASE_TABLET | Freq: Every day | ORAL | Status: DC
  Filled 2021-04-19: qty 1

## 2021-04-19 MED ORDER — POLYVINYL ALCOHOL 1.4 % OP SOLN
1.0000 [drp] | Freq: Three times a day (TID) | OPHTHALMIC | Status: DC | PRN
  Filled 2021-04-19: qty 15

## 2021-04-19 MED ORDER — AMIODARONE HCL IN DEXTROSE 360-4.14 MG/200ML-% IV SOLN
60.0000 mg/h | INTRAVENOUS | Status: AC
  Administered 2021-04-19 (×2): 60 mg/h via INTRAVENOUS
  Filled 2021-04-19: qty 200

## 2021-04-19 MED ORDER — ONDANSETRON 4 MG PO TBDP: 4.0000 mg | ORAL_TABLET | Freq: Four times a day (QID) | ORAL | Status: DC | PRN

## 2021-04-19 MED ORDER — MORPHINE SULFATE (PF) 4 MG/ML IV SOLN: 4.0000 mg | INTRAVENOUS | Status: DC | PRN

## 2021-04-19 MED ORDER — LEVOTHYROXINE SODIUM 100 MCG/5ML IV SOLN: 50.0000 ug | Freq: Every day | INTRAVENOUS | Status: DC

## 2021-04-19 MED ORDER — AMIODARONE HCL IN DEXTROSE 360-4.14 MG/200ML-% IV SOLN
30.0000 mg/h | INTRAVENOUS | Status: DC
  Administered 2021-04-20: 30 mg/h via INTRAVENOUS
  Filled 2021-04-19 (×6): qty 200

## 2021-04-19 MED ORDER — LEVETIRACETAM IN NACL 500 MG/100ML IV SOLN
500.0000 mg | Freq: Two times a day (BID) | INTRAVENOUS | Status: DC
  Administered 2021-04-19 – 2021-04-21 (×5): 500 mg via INTRAVENOUS
  Filled 2021-04-19 (×5): qty 100

## 2021-04-19 MED ORDER — CHLORHEXIDINE GLUCONATE CLOTH 2 % EX PADS
6.0000 | MEDICATED_PAD | Freq: Every day | CUTANEOUS | Status: DC
  Administered 2021-04-20 – 2021-04-21 (×2): 6 via TOPICAL

## 2021-04-19 MED ORDER — ORAL CARE MOUTH RINSE
15.0000 mL | Freq: Two times a day (BID) | OROMUCOSAL | Status: DC
  Administered 2021-04-19 – 2021-04-21 (×5): 15 mL via OROMUCOSAL

## 2021-04-19 MED ORDER — SODIUM CHLORIDE 0.9 % IV BOLUS
1000.0000 mL | Freq: Once | INTRAVENOUS | Status: AC
  Administered 2021-04-19: 1000 mL via INTRAVENOUS

## 2021-04-19 MED ORDER — PANTOPRAZOLE SODIUM 40 MG IV SOLR
40.0000 mg | Freq: Every day | INTRAVENOUS | Status: DC
  Administered 2021-04-19 – 2021-04-21 (×3): 40 mg via INTRAVENOUS
  Filled 2021-04-19 (×3): qty 10

## 2021-04-19 MED ORDER — METOPROLOL TARTRATE 5 MG/5ML IV SOLN
5.0000 mg | Freq: Four times a day (QID) | INTRAVENOUS | Status: DC
  Administered 2021-04-19 – 2021-04-20 (×2): 5 mg via INTRAVENOUS
  Filled 2021-04-19 (×3): qty 5

## 2021-04-19 MED ORDER — HYDRALAZINE HCL 20 MG/ML IJ SOLN: 10.0000 mg | INTRAMUSCULAR | Status: DC | PRN

## 2021-04-19 NOTE — ED Provider Notes (Signed)
[facility] Provider Note   CSN: [id number] Arrival date & time: 05/05/2021  1403     History  No chief complaint on file.   Danielle [name] is a 88 y.o. female.  The history is provided by the patient, the EMS personnel and medical records. No language interpreter was used.  Fall This is a new problem. The current episode started less than 1 hour ago. The problem has not changed since onset.Associated symptoms include headaches. Pertinent negatives include no chest pain, no abdominal pain and no shortness of breath. Nothing aggravates the symptoms. Nothing relieves the symptoms. She has tried nothing for the symptoms. The treatment provided no relief.      Home Medications Prior to Admission medications   Medication Sig Start Date End Date Taking? Authorizing Provider  acetaminophen (TYLENOL) 500 MG tablet Take 500 mg by mouth every 6 (six) hours as needed for moderate pain.    [provider]  calcium carbonate (OS-CAL) 600 MG TABS tablet Take 600 mg by mouth daily with breakfast.     [provider]  cetirizine (ZYRTEC) 10 MG tablet Take 10 mg by mouth daily as needed for allergies.    [provider]  Cholecalciferol (VITAMIN D) 125 MCG (5000 UT) CAPS Take 5,000 Units by mouth every 7 (seven) days.    [provider]  ELIQUIS 5 MG TABS tablet Take 5 mg by mouth 2 (two) times daily. 03/14/20   [provider]  fluticasone (FLONASE) 50 MCG/ACT nasal spray Place 2 sprays into both nostrils daily as needed for allergies. 04/28/13   [provider]  guaiFENesin (MUCINEX) 600 MG 12 hr tablet Take 1,200 mg by mouth daily.    [provider]  hydroxypropyl methylcellulose / hypromellose (ISOPTO TEARS / GONIOVISC) 2.5 % ophthalmic solution Place 1 drop into both eyes 3 (three) times daily as needed for dry eyes.    [provider]  levothyroxine (SYNTHROID) 100 MCG tablet Take 1 tablet  (100 mcg total) by mouth daily before breakfast. 09/26/20   Danielle Sine, MD  Multiple Vitamin (MULTIVITAMIN WITH MINERALS) TABS tablet Take 1 tablet by mouth daily.    [provider]  nebivolol (BYSTOLIC) 10 MG tablet Take 1 tablet (10 mg total) by mouth daily. 08/14/20   Danielle Sine, MD  rosuvastatin (CRESTOR) 5 MG tablet Take 5 mg by mouth daily.    [provider]      Allergies    Patient has no known allergies.    Review of Systems   Review of Systems  Unable to perform ROS: Mental status change  Constitutional:  Negative for chills, fatigue and fever.  HENT:  Negative for congestion.   Respiratory:  Negative for cough, chest tightness, shortness of breath and wheezing.   Cardiovascular:  Negative for chest pain and palpitations.  Gastrointestinal:  Positive for nausea and vomiting. Negative for abdominal pain, constipation and diarrhea.  Genitourinary:  Negative for dysuria.  Musculoskeletal:  Positive for neck pain. Negative for back pain and neck stiffness.  Skin:  Negative for rash.  Neurological:  Positive for headaches. Negative for light-headedness.  Psychiatric/Behavioral:  Negative for agitation and confusion.   All other systems reviewed and are negative.  Physical Exam Updated Vital Signs There were no vitals taken for this visit. Physical Exam Vitals and nursing note reviewed.  Constitutional:      General: She is not in acute distress.    Appearance: She is  well-developed. She is ill-appearing. She is not toxic-appearing or diaphoretic.  HENT:     Head: Contusion present. No laceration.   Eyes:     Extraocular Movements:     Right eye: Normal extraocular motion.     Left eye: Normal extraocular motion.     Conjunctiva/sclera: Conjunctivae normal.  Cardiovascular:     Rate and Rhythm: Normal rate and regular rhythm.     Heart sounds: No murmur heard. Pulmonary:     Effort: Pulmonary effort is normal. No respiratory distress.      Breath sounds: Normal breath sounds.  Abdominal:     Palpations: Abdomen is soft.     Tenderness: There is no abdominal tenderness.  Musculoskeletal:        General: Tenderness present. No swelling.     Cervical back: Neck supple.  Skin:    General: Skin is warm and dry.     Capillary Refill: Capillary refill takes less than 2 seconds.  Neurological:     Mental Status: She is alert. She is confused.     GCS: GCS eye subscore is 3. GCS verbal subscore is 4. GCS motor subscore is 6.     Comments: Patient moving all extremities unable to follow commands.  Some yes and no answers but is able to say her name.  Psychiatric:        Mood and Affect: Mood normal.    ED Results / Procedures / Treatments   Labs (all labs ordered are listed, but only abnormal results are displayed) Labs Reviewed  COMPREHENSIVE METABOLIC PANEL - Abnormal; Notable for the following components:      Result Value   Glucose, Bld 122 (*)    Calcium 8.8 (*)    Albumin 3.1 (*)    AST 59 (*)    Alkaline Phosphatase 154 (*)    Total Bilirubin 2.0 (*)    All other components within normal limits  CBC - Abnormal; Notable for the following components:   Platelets 103 (*)    All other components within normal limits  I-STAT CHEM 8, ED - Abnormal; Notable for the following components:   Glucose, Bld 118 (*)    Calcium, Ion 1.04 (*)    All other components within normal limits  RESP PANEL BY RT-PCR (FLU A&B, COVID) ARPGX2  ETHANOL  URINALYSIS, ROUTINE W REFLEX MICROSCOPIC  LACTIC ACID, PLASMA  SAMPLE TO BLOOD BANK    EKG EKG Interpretation  Date/Time:  Thursday April 19 2021 14:14:37 EST Ventricular Rate:  113 PR Interval:    QRS Duration: 69 QT Interval:  319 QTC Calculation: 438 R Axis:   67 Text Interpretation: Atrial fibrillation Low voltage, extremity and precordial leads Nonspecific repol abnormality, diffuse leads when compared to prior, similar afib, faster rate. No STEMI Confirmed by Antony Blackbird [id number] on 04/30/2021 3:06:49 PM  Radiology CT HEAD WO CONTRAST  Result Date: 04/27/2021 CLINICAL DATA:  Head trauma, abnormal mental status (Age 21-64y); Neck trauma (Age >= 65y) EXAM: CT HEAD WITHOUT CONTRAST CT CERVICAL SPINE WITHOUT CONTRAST TECHNIQUE: Multidetector CT imaging of the head and cervical spine was performed following the standard protocol without intravenous contrast. Multiplanar CT image reconstructions of the cervical spine were also generated. RADIATION DOSE REDUCTION: This exam was performed according to the departmental dose-optimization program which includes automated exposure control, adjustment of the mA and/or kV according to patient size and/or use of iterative reconstruction technique. COMPARISON:  CT 12/16/2020. FINDINGS: CT HEAD FINDINGS Brain: There is large volume,  acute subarachnoid hemorrhage along the left frontotemporal region and to a lesser degree the anterior right frontal lobe. There is an acute, holohemispheric, left-sided subdural hematoma measuring up to 8 mm in thickness (series 5, image 35). There is no significant midline shift. The ventricles are normal in size.Scattered subcortical and periventricular white matter hypodensities, nonspecific but likely sequela of chronic small vessel ischemic disease.Mild cerebral atrophy Vascular: No hyperdense vessel or unexpected calcification. Skull: There is a fracture of the right paramedian aspect of the occipital bone. Sinuses/Orbits: No acute finding. Other: There is a large posterior scalp hematoma. CT CERVICAL SPINE FINDINGS Alignment: Grade 1 anterolisthesis at C6-C7. Trace anterolisthesis at T1-T2. Skull base and vertebrae: There is a nondisplaced skull fracture of the right paramedian occipital bone extending along the base of the skull. There is motion artifact distorting the skull base but there is no evidence of extension into the occipital condyles or colitis Soft tissues and spinal canal: No prevertebral  fluid or swelling. No visible canal hematoma. Disc levels: There is multilevel degenerative disc disease, severe at C7-T1. There is multilevel facet arthropathy, moderate-severe. Fused facets at C7-T1. Upper chest: There is interlobular septal thickening and layering pleural effusion seen in the lung apices. Mild paraseptal emphysema. Other: None IMPRESSION: Acute left frontotemporal subarachnoid hemorrhage and to a lesser degree along the right frontal lobe. Acute, holohemispheric left-sided subdural hematoma measuring up to 8 mm in thickness. No significant midline shift. Large posterior scalp hematoma with underlying skull fracture of the right paramedian aspect of the occipital bone. No acute cervical spine fracture. Multilevel degenerative disc disease and facet arthropathy. Pulmonary edema and layering pleural effusions seen in the lung apices. These CRITICAL results were called by discussed via telephone by Dr. Macy Mis 05/01/2021 with provider Marda Stalker , who verbally acknowledged these results. Electronically Signed   By: Maurine Simmering M.D.   On: 04/21/2021 15:04   CT CERVICAL SPINE WO CONTRAST  Result Date: 04/28/2021 CLINICAL DATA:  Head trauma, abnormal mental status (Age 75-64y); Neck trauma (Age >= 65y) EXAM: CT HEAD WITHOUT CONTRAST CT CERVICAL SPINE WITHOUT CONTRAST TECHNIQUE: Multidetector CT imaging of the head and cervical spine was performed following the standard protocol without intravenous contrast. Multiplanar CT image reconstructions of the cervical spine were also generated. RADIATION DOSE REDUCTION: This exam was performed according to the departmental dose-optimization program which includes automated exposure control, adjustment of the mA and/or kV according to patient size and/or use of iterative reconstruction technique. COMPARISON:  CT 12/16/2020. FINDINGS: CT HEAD FINDINGS Brain: There is large volume, acute subarachnoid hemorrhage along the left frontotemporal region  and to a lesser degree the anterior right frontal lobe. There is an acute, holohemispheric, left-sided subdural hematoma measuring up to 8 mm in thickness (series 5, image 35). There is no significant midline shift. The ventricles are normal in size.Scattered subcortical and periventricular white matter hypodensities, nonspecific but likely sequela of chronic small vessel ischemic disease.Mild cerebral atrophy Vascular: No hyperdense vessel or unexpected calcification. Skull: There is a fracture of the right paramedian aspect of the occipital bone. Sinuses/Orbits: No acute finding. Other: There is a large posterior scalp hematoma. CT CERVICAL SPINE FINDINGS Alignment: Grade 1 anterolisthesis at C6-C7. Trace anterolisthesis at T1-T2. Skull base and vertebrae: There is a nondisplaced skull fracture of the right paramedian occipital bone extending along the base of the skull. There is motion artifact distorting the skull base but there is no evidence of extension into the occipital condyles or colitis Soft tissues and  spinal canal: No prevertebral fluid or swelling. No visible canal hematoma. Disc levels: There is multilevel degenerative disc disease, severe at C7-T1. There is multilevel facet arthropathy, moderate-severe. Fused facets at C7-T1. Upper chest: There is interlobular septal thickening and layering pleural effusion seen in the lung apices. Mild paraseptal emphysema. Other: None IMPRESSION: Acute left frontotemporal subarachnoid hemorrhage and to a lesser degree along the right frontal lobe. Acute, holohemispheric left-sided subdural hematoma measuring up to 8 mm in thickness. No significant midline shift. Large posterior scalp hematoma with underlying skull fracture of the right paramedian aspect of the occipital bone. No acute cervical spine fracture. Multilevel degenerative disc disease and facet arthropathy. Pulmonary edema and layering pleural effusions seen in the lung apices. These CRITICAL results  were called by discussed via telephone by Dr. Macy Mis 05/06/2021 with provider Marda Stalker , who verbally acknowledged these results. Electronically Signed   By: Maurine Simmering M.D.   On: 05/02/2021 15:04    Procedures Procedures    CRITICAL CARE Performed by: Gwenyth Allegra Anthonella Klausner Total critical care time: 45 minutes Critical care time was exclusive of separately billable procedures and treating other patients. Critical care was necessary to treat or prevent imminent or life-threatening deterioration. Critical care was time spent personally by me on the following activities: development of treatment plan with patient and/or surrogate as well as nursing, discussions with consultants, evaluation of patient's response to treatment, examination of patient, obtaining history from patient or surrogate, ordering and performing treatments and interventions, ordering and review of laboratory studies, ordering and review of radiographic studies, pulse oximetry and re-evaluation of patient's condition.   Medications Ordered in ED Medications  ondansetron (ZOFRAN) injection 4 mg (4 mg Intravenous Given 05/04/2021 1454)  coag fact Xa recombinant (ANDEXXA) low dose infusion 900 mg (900 mg Intravenous New Bag/Given 04/16/2021 1545)  sodium chloride 0.9 % bolus 1,000 mL (1,000 mLs Intravenous New Bag/Given 04/27/2021 1520)  ondansetron (ZOFRAN) injection 4 mg (4 mg Intravenous Given 04/24/2021 1517)    ED Course/ Medical Decision Making/ A&P                           Medical Decision Making Amount and/or Complexity of Data Reviewed Labs: ordered. Radiology: ordered.  Risk Prescription drug management. Decision regarding hospitalization.    Danielle [name] is a 88 y.o. female with a past medical history significant for atrial flutter on Eliquis therapy, carotid disease status post previous endarterectomy, asthma, hypothyroidism, and EMS report from family of dementia who presents with a fall.  According  to EMS, patient was sitting in a chair on the porch and her husband went to go get the mail but when he came back she was found at the bottom of 2 porch stairs on the concrete with a bump on the back of her head.  She was unconscious initially.  There was no bleeding or laceration but patient is developed a large hematoma on the back of her occiput.  Patient is answering questions and per EMS, is at her mental status baseline although family says that she has been doing slightly worse before the fall today.  Patient is complaining of pain in her head and neck but otherwise denying injuries or complaints.  On my exam, patient is answering some questions intermittently.  She is able to move all extremities.  Normal sensation throughout.  Symmetric smile.  Pupils are symmetric reactive normal extraocular movements.  On arrival, airways intact.  Breath  sounds equal bilateral.  EMS reports no hypoxia, tachycardia, or hypotension.  Glucose was in the 150s with EMS.  Patient is denying any complaints and does not know why she is here.  She is also denying any chest pain, palpitations, or any syncope.  Patient was a level 2 trauma.  Will get CT imaging of her head and neck.  Due to large hematoma, a more comfortable cervical collar was placed and we will see what the images show.  As she is not having any evidence of chest tenderness, back tenderness, or pelvis tenderness, will hold on other x-rays initially.  We will get screening labs.  Anticipate shared decision-making conversation after work-up was completed to determine disposition.  I reviewed CT images personally and am concerned about acute subarachnoid and subdural hematoma.  I quickly called radiology who agrees with bilateral subarachnoid, subdural, and skull fracture with hematoma.  Due to the life-threatening bleed in her head and Eliquis use, will speak with pharmacy to help reverse Eliquis.   Patient reportedly had nausea and vomiting in the  CT scanner and was given some Zofran.  She continued to vomit and will be given some more Zofran and some fluids.  Patient also reportedly started to look purple while vomiting in the scanner, will get chest x-ray to look for aspiration.  As soon as the bleeding was seen, neurosurgery was called.  We will await their recommendations.  Trauma surgery also called who responded quickly and will see and admit the patient.  3:13 PM Just spoke to [facility] with neurosurgery who will come see the patient,  Patient will be admitted by trauma.  We will continue to monitor her mental status and airway given her intracranial hemorrhage to make sure she does not need intubation.         Final Clinical Impression(s) / ED Diagnoses Final diagnoses:  Closed fracture of skull, unspecified bone, initial encounter (HCC)  SDH (subdural hematoma)  SAH (subarachnoid hemorrhage) ([place])  Fall, initial encounter  Altered mental status, unspecified altered mental status type     Clinical Impression: 1. Closed fracture of skull, unspecified bone, initial encounter ([place])   2. SDH (subdural hematoma)   3. SAH (subarachnoid hemorrhage) ([place])   4. Fall, initial encounter   5. Altered mental status, unspecified altered mental status type     Disposition: Admit  This note was prepared with assistance of Dragon voice recognition software. Occasional wrong-word or sound-a-like substitutions may have occurred due to the inherent limitations of voice recognition software.     Treana Lacour, Gwenyth Allegra, MD 05/01/2021 [phone]

## 2021-04-19 NOTE — ED Notes (Signed)
Trauma paged to Dr. Sherry Ruffing per his request

## 2021-04-19 NOTE — ED Triage Notes (Signed)
Pt here as a level 2 trauma after falling down a couple of steps ,positive loc , pt awake when ems arrived , pt has a  rather large hematoma to the back of her head , no bleeding noted

## 2021-04-19 NOTE — H&P (Signed)
LAURENASHLEY [name] 10/09/1932  [id number].    Requesting MD: Dr. Harrell Gave Tegeler  Chief Complaint/Reason for Consult: Fall  HPI: Danielle [name] is a 88 y.o. female with a hx of A. Flutter s/p cardioversion on Eliquis (last dose this morning), carotid artery disease/stenosis w/ hx of prior R endarterectomy, HTN, HLD, asthma, hypothyroidism and dementia per family who presented to the ED via EMS as a level 2 trauma after an unwitnessed fall. History obtained by patient's spouse and daughter who are both at the bedside.  Patient was walking down the 2 steps leading out of her house and fell and hit her posterior head on the concrete.  She was unresponsive according to the husband and appeared "blue" when he got to her maybe 10 seconds or so after she fell.  Reported to have large hematoma to the R occiput. No other injuries noted. Patients mental status has been significantly worsening over the last week.  Husband states she is talking to her deceased brothers and sisters.  She is unable to navigate her own house and needs help doing everything.   On presentation patient found to be tachycardic and hypertensive. She underwent workup by EDP and was found to have large L frontotemporal SAH, R frontal SAH, L SDH and large posterior scalp hematoma w/ underlying occipital skull fracture. CT C-spine negative for fx but did see pulmonary edema and layering pleural effusions of the lung apices.  She did vomit several times since arrival CXR negative for active disease. Eliquis is being reversed with Andexxa. NSGY has been consulted, Dr. Marcello Moores, who will see the patient. Patient is not currently intubated.   On our assessment patient initially would not respond to voice, follow commands, etc.  Dr. Sherry Ruffing stated she would respond and tell him her name when she got here.  She will purposefully move her arms to try and get her IV out etc.   Hx of prior partial colectomy and colostomy in 2005 and colostomy  reversal in 2006, gastric surgery that is not specified in 2006. Prior tobacco use. Quit 57 years ago. No alcohol use. Retired. Lives at home with her husband and daughter assists in taking care of her.  She has also had several recent falls.  Her last one was in October that she had a significant concussion from with persistent symptoms that have just recently resolved.  ROS: Review of Systems  Unable to perform ROS: Dementia  Respiratory:  Negative for shortness of breath.   Cardiovascular:  Negative for chest pain.  Gastrointestinal:  Negative for abdominal pain, nausea and vomiting.  Musculoskeletal:  Positive for falls. Negative for neck pain.  Neurological:  Positive for headaches.  Psychiatric/Behavioral:  Negative for substance abuse.    Family History  Problem Relation Age of Onset   Cancer - Lung Mother    Cancer - Other Other     Past Medical History:  Diagnosis Date   Anxiety    Arthritis    Right shoulder arthritis   Asthma    Atrial fibrillation ([place])    Carotid artery disease (HCC)    Chronic anticoagulation    Diverticulitis    Dysrhythmia    Headache(784.0)    Heart murmur    Hypothyroidism    Memory impairment    Osteoarthritis of left shoulder 07/20/2013   Peripheral vascular disease ([place])    carotid    Primary osteoarthritis of right shoulder 11/16/2013   Seasonal allergies     Past  Surgical History:  Procedure Laterality Date   BREAST EXCISIONAL BIOPSY Left    CARDIOVERSION N/A 08/29/2020   Procedure: CARDIOVERSION;  Surgeon: [name], MD;  Location: [facility] ENDOSCOPY;  Service: Cardiovascular;  Laterality: N/A;   CAROTID ENDARTERECTOMY Right    CATARACT EXTRACTION W/ INTRAOCULAR LENS  IMPLANT, BILATERAL     COLON SURGERY     COLON SURGERY  2005   had a colostomy for 6 months    COLONOSCOPY     EYE SURGERY Bilateral    cataracts    GASTRIC RESTRICTION SURGERY Left 2006   TONSILLECTOMY     TOTAL SHOULDER ARTHROPLASTY Left 07/20/2013    Procedure: TOTAL SHOULDER ARTHROPLASTY;  Surgeon: [name], MD;  Location: [place];  Service: Orthopedics;  Laterality: Left;   TOTAL SHOULDER ARTHROPLASTY Right 11/16/2013   Dr [name]   TOTAL SHOULDER ARTHROPLASTY Right 11/16/2013   Procedure: RIGHT TOTAL SHOULDER ARTHROPLASTY;  Surgeon: [name], MD;  Location: [place];  Service: Orthopedics;  Laterality: Right;    Social History:  reports that she quit smoking about 57 years ago. Her smoking use included cigarettes. She has a 0.50 pack-year smoking history. She has never used smokeless tobacco. She reports that she does not drink alcohol and does not use drugs.  Allergies: No Known Allergies  (Not in a hospital admission)    Physical Exam: Blood pressure (!) 167/110, pulse (!) 144, temperature 97.7 F (36.5 C), temperature source Oral, resp. rate 19, SpO2 99 %. General: agitated appearing elderly female. HEENT: R posterior/occipital scalp hematoma. Sclera are noninjected.  PERRL.  Ears and nose without any masses or lesions.  Mouth is pink and moist. Dentures in place and removed. Neck: C-Spine without step offs. No stridor. Trachea midline. Supple.  Heart: Irregularly Irregular, tachy.  Normal s1,s2. No obvious murmurs, gallops, or rubs noted.  Palpable radial and pedal pulses bilaterally  Lungs: CTAB, no wheezes, rhonchi, or rales noted.  Respiratory effort nonlabored Abd:  Soft, NT/ND, +BS, no masses, hernias, or organomegaly, midline scar noted from prior ex lap.   MS: MAE's spontaneously but not to command. No obvious deformities of the BUE/BLE's. No BUE/BLE edema, calves soft and nontender Skin: Scalp hematoma as noted above. Otherwise warm and dry with no masses, lesions, or rashes Psych: agitated, but won't participate in psych exam due to AMS Neuro: cranial nerves appear grossly intact, equal strength in BUE/BLE bilaterally evaluated while trying to restrain patient when placing an IV.  She does not follow commands or  participate in her exam.  She would not speak for [place]. She got a [id number], she very rarely opened to eyes to voice, moaned once, and purposefully moves her arms to noxious stimuli.   Results for orders placed or performed during the hospital encounter of 04/12/2021 (from the past 48 hour(s))  Resp Panel by RT-PCR (Flu A&B, Covid) Nasopharyngeal Swab     Status: None   Collection Time: 04/28/2021  2:10 PM   Specimen: Nasopharyngeal Swab; Nasopharyngeal(NP) swabs in vial transport medium  Result Value Ref Range   SARS Coronavirus 2 by RT PCR NEGATIVE NEGATIVE    Comment: (NOTE) SARS-CoV-2 target nucleic acids are NOT DETECTED.  The SARS-CoV-2 RNA is generally detectable in upper respiratory specimens during the acute phase of infection. The lowest concentration of SARS-CoV-2 viral copies this assay can detect is 138 copies/mL. A negative result does not preclude SARS-Cov-2 infection and should not be used as the sole basis for treatment or other patient  management decisions. A negative result may occur with  improper specimen collection/handling, submission of specimen other than nasopharyngeal swab, presence of viral mutation(s) within the areas targeted by this assay, and inadequate number of viral copies(<138 copies/mL). A negative result must be combined with clinical observations, patient history, and epidemiological information. The expected result is Negative.  Fact Sheet for Patients:  EntrepreneurPulse.com.au  Fact Sheet for Healthcare Providers:  IncredibleEmployment.be  This test is no t yet approved or cleared by the [place] FDA and  has been authorized for detection and/or diagnosis of SARS-CoV-2 by FDA under an Emergency Use Authorization (EUA). This EUA will remain  in effect (meaning this test can be used) for the duration of the COVID-19 declaration under Section 564(b)(1) of the Act, 21 U.S.C.section 360bbb-3(b)(1), unless the  authorization is terminated  or revoked sooner.       Influenza A by PCR NEGATIVE NEGATIVE   Influenza B by PCR NEGATIVE NEGATIVE    Comment: (NOTE) The Xpert Xpress SARS-CoV-2/FLU/RSV plus assay is intended as an aid in the diagnosis of influenza from Nasopharyngeal swab specimens and should not be used as a sole basis for treatment. Nasal washings and aspirates are unacceptable for Xpert Xpress SARS-CoV-2/FLU/RSV testing.  Fact Sheet for Patients: EntrepreneurPulse.com.au  Fact Sheet for Healthcare Providers: IncredibleEmployment.be  This test is not yet approved or cleared by the [place] FDA and has been authorized for detection and/or diagnosis of SARS-CoV-2 by FDA under an Emergency Use Authorization (EUA). This EUA will remain in effect (meaning this test can be used) for the duration of the COVID-19 declaration under Section 564(b)(1) of the Act, 21 U.S.C. section 360bbb-3(b)(1), unless the authorization is terminated or revoked.  Performed at [facility], [place] [address], [place], [place] [address]   Comprehensive metabolic panel     Status: Abnormal   Collection Time: 04/15/2021  2:20 PM  Result Value Ref Range   Sodium 138 135 - 145 mmol/L   Potassium 3.6 3.5 - 5.1 mmol/L   Chloride 106 98 - 111 mmol/L   CO2 23 22 - 32 mmol/L   Glucose, Bld 122 (H) 70 - 99 mg/dL    Comment: Glucose reference range applies only to samples taken after fasting for at least 8 hours.   BUN 15 8 - 23 mg/dL   Creatinine, Ser 0.90 0.44 - 1.00 mg/dL   Calcium 8.8 (L) 8.9 - 10.3 mg/dL   Total Protein 6.5 6.5 - 8.1 g/dL   Albumin 3.1 (L) 3.5 - 5.0 g/dL   AST 59 (H) 15 - 41 U/L   ALT 33 0 - 44 U/L   Alkaline Phosphatase 154 (H) 38 - 126 U/L   Total Bilirubin 2.0 (H) 0.3 - 1.2 mg/dL   GFR, Estimated >60 >60 mL/min    Comment: (NOTE) Calculated using the CKD-EPI Creatinine Equation (2021)    Anion gap 9 5 - 15    Comment: Performed at  [facility], [place] [address], [place], [place] [address]  CBC     Status: Abnormal   Collection Time: 04/16/2021  2:20 PM  Result Value Ref Range   WBC 6.4 4.0 - 10.5 K/uL   RBC 4.39 3.87 - 5.11 MIL/uL   Hemoglobin 14.2 12.0 - 15.0 g/dL   HCT 41.9 36.0 - 46.0 %   MCV 95.4 80.0 - 100.0 fL   MCH 32.3 26.0 - 34.0 pg   MCHC 33.9 30.0 - 36.0 g/dL   RDW 15.2 11.5 -  15.5 %   Platelets 103 (L) 150 - 400 K/uL    Comment: Immature Platelet Fraction may be clinically indicated, consider ordering this additional test [id number] REPEATED TO VERIFY    nRBC 0.0 0.0 - 0.2 %    Comment: Performed at [facility], [place] [address], [place], [place] [address]  I-Stat Chem 8, ED     Status: Abnormal   Collection Time: 04/27/2021  2:31 PM  Result Value Ref Range   Sodium 140 135 - 145 mmol/L   Potassium 3.7 3.5 - 5.1 mmol/L   Chloride 106 98 - 111 mmol/L   BUN 16 8 - 23 mg/dL   Creatinine, Ser 0.80 0.44 - 1.00 mg/dL   Glucose, Bld 118 (H) 70 - 99 mg/dL    Comment: Glucose reference range applies only to samples taken after fasting for at least 8 hours.   Calcium, Ion 1.04 (L) 1.15 - 1.40 mmol/L   TCO2 23 22 - 32 mmol/L   Hemoglobin 15.0 12.0 - 15.0 g/dL   HCT 44.0 36.0 - 46.0 %   CT HEAD WO CONTRAST  Result Date: 04/12/2021 CLINICAL DATA:  Head trauma, abnormal mental status (Age 5-64y); Neck trauma (Age >= 65y) EXAM: CT HEAD WITHOUT CONTRAST CT CERVICAL SPINE WITHOUT CONTRAST TECHNIQUE: Multidetector CT imaging of the head and cervical spine was performed following the standard protocol without intravenous contrast. Multiplanar CT image reconstructions of the cervical spine were also generated. RADIATION DOSE REDUCTION: This exam was performed according to the departmental dose-optimization program which includes automated exposure control, adjustment of the mA and/or kV according to patient size and/or use of iterative reconstruction technique. COMPARISON:  CT 12/16/2020. FINDINGS: CT HEAD  FINDINGS Brain: There is large volume, acute subarachnoid hemorrhage along the left frontotemporal region and to a lesser degree the anterior right frontal lobe. There is an acute, holohemispheric, left-sided subdural hematoma measuring up to 8 mm in thickness (series 5, image 35). There is no significant midline shift. The ventricles are normal in size.Scattered subcortical and periventricular white matter hypodensities, nonspecific but likely sequela of chronic small vessel ischemic disease.Mild cerebral atrophy Vascular: No hyperdense vessel or unexpected calcification. Skull: There is a fracture of the right paramedian aspect of the occipital bone. Sinuses/Orbits: No acute finding. Other: There is a large posterior scalp hematoma. CT CERVICAL SPINE FINDINGS Alignment: Grade 1 anterolisthesis at C6-C7. Trace anterolisthesis at T1-T2. Skull base and vertebrae: There is a nondisplaced skull fracture of the right paramedian occipital bone extending along the base of the skull. There is motion artifact distorting the skull base but there is no evidence of extension into the occipital condyles or colitis Soft tissues and spinal canal: No prevertebral fluid or swelling. No visible canal hematoma. Disc levels: There is multilevel degenerative disc disease, severe at C7-T1. There is multilevel facet arthropathy, moderate-severe. Fused facets at C7-T1. Upper chest: There is interlobular septal thickening and layering pleural effusion seen in the lung apices. Mild paraseptal emphysema. Other: None IMPRESSION: Acute left frontotemporal subarachnoid hemorrhage and to a lesser degree along the right frontal lobe. Acute, holohemispheric left-sided subdural hematoma measuring up to 8 mm in thickness. No significant midline shift. Large posterior scalp hematoma with underlying skull fracture of the right paramedian aspect of the occipital bone. No acute cervical spine fracture. Multilevel degenerative disc disease and facet  arthropathy. Pulmonary edema and layering pleural effusions seen in the lung apices. These CRITICAL results were called by discussed via telephone by Dr. Macy Mis 05/08/2021 with  provider Marda Stalker , who verbally acknowledged these results. Electronically Signed   By: Maurine Simmering M.D.   On: 04/25/2021 15:04   CT CERVICAL SPINE WO CONTRAST  Result Date: 05/06/2021 CLINICAL DATA:  Head trauma, abnormal mental status (Age 38-64y); Neck trauma (Age >= 65y) EXAM: CT HEAD WITHOUT CONTRAST CT CERVICAL SPINE WITHOUT CONTRAST TECHNIQUE: Multidetector CT imaging of the head and cervical spine was performed following the standard protocol without intravenous contrast. Multiplanar CT image reconstructions of the cervical spine were also generated. RADIATION DOSE REDUCTION: This exam was performed according to the departmental dose-optimization program which includes automated exposure control, adjustment of the mA and/or kV according to patient size and/or use of iterative reconstruction technique. COMPARISON:  CT 12/16/2020. FINDINGS: CT HEAD FINDINGS Brain: There is large volume, acute subarachnoid hemorrhage along the left frontotemporal region and to a lesser degree the anterior right frontal lobe. There is an acute, holohemispheric, left-sided subdural hematoma measuring up to 8 mm in thickness (series 5, image 35). There is no significant midline shift. The ventricles are normal in size.Scattered subcortical and periventricular white matter hypodensities, nonspecific but likely sequela of chronic small vessel ischemic disease.Mild cerebral atrophy Vascular: No hyperdense vessel or unexpected calcification. Skull: There is a fracture of the right paramedian aspect of the occipital bone. Sinuses/Orbits: No acute finding. Other: There is a large posterior scalp hematoma. CT CERVICAL SPINE FINDINGS Alignment: Grade 1 anterolisthesis at C6-C7. Trace anterolisthesis at T1-T2. Skull base and vertebrae: There is a  nondisplaced skull fracture of the right paramedian occipital bone extending along the base of the skull. There is motion artifact distorting the skull base but there is no evidence of extension into the occipital condyles or colitis Soft tissues and spinal canal: No prevertebral fluid or swelling. No visible canal hematoma. Disc levels: There is multilevel degenerative disc disease, severe at C7-T1. There is multilevel facet arthropathy, moderate-severe. Fused facets at C7-T1. Upper chest: There is interlobular septal thickening and layering pleural effusion seen in the lung apices. Mild paraseptal emphysema. Other: None IMPRESSION: Acute left frontotemporal subarachnoid hemorrhage and to a lesser degree along the right frontal lobe. Acute, holohemispheric left-sided subdural hematoma measuring up to 8 mm in thickness. No significant midline shift. Large posterior scalp hematoma with underlying skull fracture of the right paramedian aspect of the occipital bone. No acute cervical spine fracture. Multilevel degenerative disc disease and facet arthropathy. Pulmonary edema and layering pleural effusions seen in the lung apices. These CRITICAL results were called by discussed via telephone by Dr. Macy Mis 04/26/2021 with provider Marda Stalker , who verbally acknowledged these results. Electronically Signed   By: Maurine Simmering M.D.   On: 04/12/2021 15:04   DG Chest Portable 1 View  Result Date: 05/06/2021 CLINICAL DATA:  Trauma. EXAM: PORTABLE CHEST 1 VIEW COMPARISON:  Jul 12, 2013. FINDINGS: The heart size and mediastinal contours are within normal limits. Minimal bibasilar subsegmental atelectasis or scarring is noted. Status post bilateral shoulder arthroplasties. IMPRESSION: Minimal bibasilar subsegmental atelectasis or scarring. Electronically Signed   By: Marijo Conception M.D.   On: 04/23/2021 15:13    Anti-infectives (From admission, onward)    None       Assessment/Plan Unwitnessed  Fall TBI/SAH/SDH - Eliquis being reversed with Andexxa. NSGY, Dr. Marcello Moores, consulted at 3:13pm per EDP notes. Await recommendations. Exam appears to at least have slightly worsened since her arrival with decrease in her MS.  Husband states he is willing to place her on the vent if  needed if we feel she may have a chance at survival; however, if she is not expected to survive, her wishes are to not be on life support.  We did have a lengthy discussion that the next 24 hours would be telling and pending how things go, we may likely need to have palliative care involvement to help with [place] given her overall decline at home.  Keppra for seizure prophylaxis. Occipital Skull fracture with scalp hematoma - Per NSGY, Dr. Marcello Moores Hx A. Flutter on Eliquis - being reversed w/ Andexxa.  Really not a candidate for Eliquis to be restarted due to multiple falls and multiple head injuries.  Lopressor, may need amio Hx CAD Hx asthma Hx hypothyroidism - IV synthroid Hx HTN - lopressor for HTN and tachy Hx HLD Elevated LFTs - chronic appearing, ? Related to crestor FEN - NPO, IVFs VTE - SCDs, anticoagulation and chemical prophylaxis on hold due to TBI ID - none currently needed Foley - None currently. Monitor I/O Dispo - Admit to trauma. ICU. Reverse Eliquis. Await NSGY recs. Q2hr neuro checks. Keppra for seizure prophylaxis. CTH in AM. Monitor airway and mental status, if worsens may need intubation.  See above discussion with husband.  Likely will need palliative involvement.    I reviewed nursing notes, ED provider notes, last 24 h vitals and pain scores, last 24 h labs and trends, and last 24 h imaging results.  This care required high  level of medical decision making.   Henreitta Cea, [facility] Surgery 04/26/2021, 3:37 PM Please see Amion for pager number during day hours 7:00am-4:30pm

## 2021-04-19 NOTE — Progress Notes (Signed)
°  Transition of Care [facility]) Screening Note   Patient Details  Name: Danielle [name] Date of Birth: Aug 08, 1932   Transition [facility]) CM/SW Contact:    Ella Bodo, RN Phone Number: 04/14/2021, 4:58 PM    Transition of Care Department [facility]) has reviewed patient and no TOC needs have been identified at this time. We will continue to monitor patient advancement through interdisciplinary progression rounds. If new patient transition needs arise, please place a TOC consult.   Reinaldo Raddle, RN, BSN  Trauma/Neuro ICU Case Manager [phone]

## 2021-04-19 NOTE — ED Notes (Signed)
Trauma Response Nurse Documentation   Danielle [name] is a 88 y.o. female arriving to [place] ED via EMS  On Eliquis (apixaban) daily. Trauma was activated as a Level 2 by ED Charge RN based on the following trauma criteria Elderly patients > 65 with head trauma on anti-coagulation (excluding ASA). Trauma team at the bedside on patient arrival. Patient cleared for CT by Dr. Sherry [name]. Patient to CT with team. GCS 9.  History   Past Medical History:  Diagnosis Date   Anxiety    Arthritis    Right shoulder arthritis   Asthma    Atrial fibrillation ([place])    Carotid artery disease (HCC)    Chronic anticoagulation    Diverticulitis    Dysrhythmia    Headache(784.0)    Heart murmur    Hypothyroidism    Memory impairment    Osteoarthritis of left shoulder 07/20/2013   Peripheral vascular disease ([place])    carotid    Primary osteoarthritis of right shoulder 11/16/2013   Seasonal allergies      Past Surgical History:  Procedure Laterality Date   BREAST EXCISIONAL BIOPSY Left    CARDIOVERSION N/A 08/29/2020   Procedure: CARDIOVERSION;  Surgeon: Danielle Sine, MD;  Location: [facility] ENDOSCOPY;  Service: Cardiovascular;  Laterality: N/A;   CAROTID ENDARTERECTOMY Right    CATARACT EXTRACTION W/ INTRAOCULAR LENS  IMPLANT, BILATERAL     COLON SURGERY     COLON SURGERY  2005   had a colostomy for 6 months    COLONOSCOPY     EYE SURGERY Bilateral    cataracts    GASTRIC RESTRICTION SURGERY Left 2006   TONSILLECTOMY     TOTAL SHOULDER ARTHROPLASTY Left 07/20/2013   Procedure: TOTAL SHOULDER ARTHROPLASTY;  Surgeon: Danielle Bridge, MD;  Location: [place];  Service: Orthopedics;  Laterality: Left;   TOTAL SHOULDER ARTHROPLASTY Right 11/16/2013   Dr Danielle [name]   TOTAL SHOULDER ARTHROPLASTY Right 11/16/2013   Procedure: RIGHT TOTAL SHOULDER ARTHROPLASTY;  Surgeon: Danielle Bridge, MD;  Location: [place];  Service: Orthopedics;  Laterality: Right;       Initial Focused Assessment (If applicable, or  please see trauma documentation): - GCS 9 (responds to voice, no speech and localizes pain) - Pupils equal brisk - large hematoma to back of head with abrasion  CT's Completed:   CT Head and CT C-Spine   Interventions:  - 20G PIV to R FA - Labs drawn - CT head and neck - CXR - Spoke with family - Transported to 4NICU - Zofran given - Andexxa given - NS bolus given - 20G PIV to L FA  Plan for disposition:  Admission to ICU Plan for f/u CT in am - start prophylactic keppra.   Consults completed:  Neurosurgeon at 1559. Danielle Brass, NP)  Event Summary: - Pt was at home with her husband.  Her husband walked to the mailbox and told pt to stay in place and to wait for him.  Pt fell down a few steps.  She did lose consciousness on scene but was awake when EMS arrived.  Pt has a large hematoma to back of head with no active bleeding.    MTP Summary (If applicable): n/a  Bedside handoff with Inpatient RN Danielle [name].    Danielle [name]  Trauma Response RN  Please call TRN at [phone] for further assistance.

## 2021-04-19 NOTE — ED Triage Notes (Signed)
Pt BIB [facility] from home s/p unwitnessed fall onto concrete. Pt takes Eliquis. Hematoma noted to R posterior head. Level 2 trauma activated prior to arrival.

## 2021-04-19 NOTE — Progress Notes (Signed)
°  Amiodarone Drug - Drug Interaction Consult Note  Recommendations: Monitor frequency of zofran - if using frequently consider getting EKG. Off anticoagulation given fall and received Andexxa today - monitor closely.   Amiodarone is metabolized by the cytochrome P450 system and therefore has the potential to cause many drug interactions. Amiodarone has an average plasma half-life of 50 days (range 20 to 100 days).   There is potential for drug interactions to occur several weeks or months after stopping treatment and the onset of drug interactions may be slow after initiating amiodarone.   []  Statins: Increased risk of myopathy. Simvastatin- restrict dose to 20mg  daily. Other statins: counsel patients to report any muscle pain or weakness immediately.  []  Anticoagulants: Amiodarone can increase anticoagulant effect. Consider warfarin dose reduction. Patients should be monitored closely and the dose of anticoagulant altered accordingly, remembering that amiodarone levels take several weeks to stabilize.  []  Antiepileptics: Amiodarone can increase plasma concentration of phenytoin, the dose should be reduced. Note that small changes in phenytoin dose can result in large changes in levels. Monitor patient and counsel on signs of toxicity.  [x]  Beta blockers: increased risk of bradycardia, AV block and myocardial depression. Sotalol - avoid concomitant use.  []   Calcium channel blockers (diltiazem and verapamil): increased risk of bradycardia, AV block and myocardial depression.  []   Cyclosporine: Amiodarone increases levels of cyclosporine. Reduced dose of cyclosporine is recommended.  []  Digoxin dose should be halved when amiodarone is started.  []  Diuretics: increased risk of cardiotoxicity if hypokalemia occurs.  []  Oral hypoglycemic agents (glyburide, glipizide, glimepiride): increased risk of hypoglycemia. Patient's glucose levels should be monitored closely when initiating amiodarone  therapy.   [x]  Drugs that prolong the QT interval:  Torsades de pointes risk may be increased with concurrent use - avoid if possible.  Monitor QTc, also keep magnesium/potassium WNL if concurrent therapy can't be avoided.  Antibiotics: e.g. fluoroquinolones, erythromycin.  Antiarrhythmics: e.g. quinidine, procainamide, disopyramide, sotalol.  Antipsychotics: e.g. phenothiazines, haloperidol.   Lithium, tricyclic antidepressants, and methadone.  Thank You,  Antonietta Jewel, PharmD, [place] Clinical Pharmacist  Phone: [phone] 04/21/2021 6:31 PM  Please check AMION for all [place] phone numbers After 10:00 PM, call [place] [phone]

## 2021-04-19 NOTE — Consult Note (Signed)
Reason for Consult: [facility], SDH Referring Physician: Tegeler, Gwenyth Allegra, MD   HPI: Danielle [name] is an 88 y.o. female with a PmHx of atrial flutter on Eliquis, carotid disease s/p endarterectomy, asthma, hypothyroidism, HTN, HLD and dementia that the family reports has been progressing more rapidly over the last year. She was BIB EMS as a level 2 trauma after fall with head trauma and LOC. The patient is unable to provide history due to underlying dementia and acuity of her condition. History is obtained from the patient's husband and daughter who are at bedside, as well as the EDP and chart review. Per the patient's husband, she was sitting on the porch when he went to get the mail from his mailbox. On his way back from the mailbox, the patient was found lying on the concrete sidewalk unresponsive with trauma to her right occiput at the bottom of two stairs. She reportedly took her usual dose of Eliquis this morning. On arrival, she was reported to be at her mental status baseline although family says that she has been doing slightly worse over the last week. A CT head and cervical spine were obtained. CT head revealed bilateral SAH, SDH, and occipital skull fracture with hematoma. Due to findings on the CT head, NSX consult was requested.   Past Medical History:  Diagnosis Date   Anxiety    Arthritis    Right shoulder arthritis   Asthma    Atrial fibrillation (HCC)    Carotid artery disease (HCC)    Chronic anticoagulation    Diverticulitis    Dysrhythmia    Headache(784.0)    Heart murmur    Hypothyroidism    Memory impairment    Osteoarthritis of left shoulder 07/20/2013   Peripheral vascular disease ([place])    carotid    Primary osteoarthritis of right shoulder 11/16/2013   Seasonal allergies     Past Surgical History:  Procedure Laterality Date   BREAST EXCISIONAL BIOPSY Left    CARDIOVERSION N/A 08/29/2020   Procedure: CARDIOVERSION;  Surgeon: [name], MD;  [facility]  ENDOSCOPY;  Service: Cardiovascular;  Laterality: N/A;   CAROTID ENDARTERECTOMY Right    CATARACT EXTRACTION W/ INTRAOCULAR LENS  IMPLANT, BILATERAL     COLON SURGERY     COLON SURGERY  2005   had a colostomy for 6 months    COLONOSCOPY     EYE SURGERY Bilateral    cataracts    GASTRIC RESTRICTION SURGERY Left 2006   TONSILLECTOMY     TOTAL SHOULDER ARTHROPLASTY Left 07/20/2013   Procedure: TOTAL SHOULDER ARTHROPLASTY;  Surgeon: [name], MD;  Location: [place];  Service: Orthopedics;  Laterality: Left;   TOTAL SHOULDER ARTHROPLASTY Right 11/16/2013   Dr [name]   TOTAL SHOULDER ARTHROPLASTY Right 11/16/2013   Procedure: RIGHT TOTAL SHOULDER ARTHROPLASTY;  Surgeon: [name], MD;  Location: [place];  Service: Orthopedics;  Laterality: Right;    Family History  Problem Relation Age of Onset   Cancer - Lung Mother    Cancer - Other Other     Social History:  reports that she quit smoking about 57 years ago. Her smoking use included cigarettes. She has a 0.50 pack-year smoking history. She has never used smokeless tobacco. She reports that she does not drink alcohol and does not use drugs.  Allergies: No Known Allergies  Medications: I have reviewed the patient's current medications.  Results for orders placed or performed during the hospital encounter of 04/23/2021 (from  the past 48 hour(s))  Resp Panel by RT-PCR (Flu A&B, Covid) Nasopharyngeal Swab     Status: None   Collection Time: 04/12/2021  2:10 PM   Specimen: Nasopharyngeal Swab; Nasopharyngeal(NP) swabs in vial transport medium  Result Value Ref Range   SARS Coronavirus 2 by RT PCR NEGATIVE NEGATIVE    Comment: (NOTE) SARS-CoV-2 target nucleic acids are NOT DETECTED.  The SARS-CoV-2 RNA is generally detectable in upper respiratory specimens during the acute phase of infection. The lowest concentration of SARS-CoV-2 viral copies this assay can detect is 138 copies/mL. A negative result does not preclude  SARS-Cov-2 infection and should not be used as the sole basis for treatment or other patient management decisions. A negative result may occur with  improper specimen collection/handling, submission of specimen other than nasopharyngeal swab, presence of viral mutation(s) within the areas targeted by this assay, and inadequate number of viral copies(<138 copies/mL). A negative result must be combined with clinical observations, patient history, and epidemiological information. The expected result is Negative.  Fact Sheet for Patients:  EntrepreneurPulse.com.au  Fact Sheet for Healthcare Providers:  IncredibleEmployment.be  This test is no t yet approved or cleared by the [place] FDA and  has been authorized for detection and/or diagnosis of SARS-CoV-2 by FDA under an Emergency Use Authorization (EUA). This EUA will remain  in effect (meaning this test can be used) for the duration of the COVID-19 declaration under Section 564(b)(1) of the Act, 21 U.S.C.section 360bbb-3(b)(1), unless the authorization is terminated  or revoked sooner.       Influenza A by PCR NEGATIVE NEGATIVE   Influenza B by PCR NEGATIVE NEGATIVE    Comment: (NOTE) The Xpert Xpress SARS-CoV-2/FLU/RSV plus assay is intended as an aid in the diagnosis of influenza from Nasopharyngeal swab specimens and should not be used as a sole basis for treatment. Nasal washings and aspirates are unacceptable for Xpert Xpress SARS-CoV-2/FLU/RSV testing.  Fact Sheet for Patients: EntrepreneurPulse.com.au  Fact Sheet for Healthcare Providers: IncredibleEmployment.be  This test is not yet approved or cleared by the [place] FDA and has been authorized for detection and/or diagnosis of SARS-CoV-2 by FDA under an Emergency Use Authorization (EUA). This EUA will remain in effect (meaning this test can be used) for the duration of the COVID-19  declaration under Section 564(b)(1) of the Act, 21 U.S.C. section 360bbb-3(b)(1), unless the authorization is terminated or revoked.  Performed at [facility], [place] [address] [place], [place] [address]   Comprehensive metabolic panel     Status: Abnormal   Collection Time: 04/17/2021  2:20 PM  Result Value Ref Range   Sodium 138 135 - 145 mmol/L   Potassium 3.6 3.5 - 5.1 mmol/L   Chloride 106 98 - 111 mmol/L   CO2 23 22 - 32 mmol/L   Glucose, Bld 122 (H) 70 - 99 mg/dL    Comment: Glucose reference range applies only to samples taken after fasting for at least 8 hours.   BUN 15 8 - 23 mg/dL   Creatinine, Ser 0.90 0.44 - 1.00 mg/dL   Calcium 8.8 (L) 8.9 - 10.3 mg/dL   Total Protein 6.5 6.5 - 8.1 g/dL   Albumin 3.1 (L) 3.5 - 5.0 g/dL   AST 59 (H) 15 - 41 U/L   ALT 33 0 - 44 U/L   Alkaline Phosphatase 154 (H) 38 - 126 U/L   Total Bilirubin 2.0 (H) 0.3 - 1.2 mg/dL   GFR, Estimated >60 >60 mL/min  Comment: (NOTE) Calculated using the CKD-EPI Creatinine Equation (2021)    Anion gap 9 5 - 15    Comment: Performed at [facility], [place] [address] [place], [place] [address]  CBC     Status: Abnormal   Collection Time: 04/14/2021  2:20 PM  Result Value Ref Range   WBC 6.4 4.0 - 10.5 K/uL   RBC 4.39 3.87 - 5.11 MIL/uL   Hemoglobin 14.2 12.0 - 15.0 g/dL   HCT 41.9 36.0 - 46.0 %   MCV 95.4 80.0 - 100.0 fL   MCH 32.3 26.0 - 34.0 pg   MCHC 33.9 30.0 - 36.0 g/dL   RDW 15.2 11.5 - 15.5 %   Platelets 103 (L) 150 - 400 K/uL    Comment: Immature Platelet Fraction may be clinically indicated, consider ordering this additional test [id number] REPEATED TO VERIFY    nRBC 0.0 0.0 - 0.2 %    Comment: Performed at [facility], [place] [address], [place], [place] [address]  I-Stat Chem 8, ED     Status: Abnormal   Collection Time: 05/04/2021  2:31 PM  Result Value Ref Range   Sodium 140 135 - 145 mmol/L   Potassium 3.7 3.5 - 5.1 mmol/L   Chloride 106 98 - 111 mmol/L   BUN 16 8  - 23 mg/dL   Creatinine, Ser 0.80 0.44 - 1.00 mg/dL   Glucose, Bld 118 (H) 70 - 99 mg/dL    Comment: Glucose reference range applies only to samples taken after fasting for at least 8 hours.   Calcium, Ion 1.04 (L) 1.15 - 1.40 mmol/L   TCO2 23 22 - 32 mmol/L   Hemoglobin 15.0 12.0 - 15.0 g/dL   HCT 44.0 36.0 - 46.0 %    CT HEAD WO CONTRAST  Result Date: 04/30/2021 CLINICAL DATA:  Head trauma, abnormal mental status (Age 57-64y); Neck trauma (Age >= 65y) EXAM: CT HEAD WITHOUT CONTRAST CT CERVICAL SPINE WITHOUT CONTRAST TECHNIQUE: Multidetector CT imaging of the head and cervical spine was performed following the standard protocol without intravenous contrast. Multiplanar CT image reconstructions of the cervical spine were also generated. RADIATION DOSE REDUCTION: This exam was performed according to the departmental dose-optimization program which includes automated exposure control, adjustment of the mA and/or kV according to patient size and/or use of iterative reconstruction technique. COMPARISON:  CT 12/16/2020. FINDINGS: CT HEAD FINDINGS Brain: There is large volume, acute subarachnoid hemorrhage along the left frontotemporal region and to a lesser degree the anterior right frontal lobe. There is an acute, holohemispheric, left-sided subdural hematoma measuring up to 8 mm in thickness (series 5, image 35). There is no significant midline shift. The ventricles are normal in size.Scattered subcortical and periventricular white matter hypodensities, nonspecific but likely sequela of chronic small vessel ischemic disease.Mild cerebral atrophy Vascular: No hyperdense vessel or unexpected calcification. Skull: There is a fracture of the right paramedian aspect of the occipital bone. Sinuses/Orbits: No acute finding. Other: There is a large posterior scalp hematoma. CT CERVICAL SPINE FINDINGS Alignment: Grade 1 anterolisthesis at C6-C7. Trace anterolisthesis at T1-T2. Skull base and vertebrae: There is a  nondisplaced skull fracture of the right paramedian occipital bone extending along the base of the skull. There is motion artifact distorting the skull base but there is no evidence of extension into the occipital condyles or colitis Soft tissues and spinal canal: No prevertebral fluid or swelling. No visible canal hematoma. Disc levels: There is multilevel degenerative disc disease, severe at  C7-T1. There is multilevel facet arthropathy, moderate-severe. Fused facets at C7-T1. Upper chest: There is interlobular septal thickening and layering pleural effusion seen in the lung apices. Mild paraseptal emphysema. Other: None IMPRESSION: Acute left frontotemporal subarachnoid hemorrhage and to a lesser degree along the right frontal lobe. Acute, holohemispheric left-sided subdural hematoma measuring up to 8 mm in thickness. No significant midline shift. Large posterior scalp hematoma with underlying skull fracture of the right paramedian aspect of the occipital bone. No acute cervical spine fracture. Multilevel degenerative disc disease and facet arthropathy. Pulmonary edema and layering pleural effusions seen in the lung apices. These CRITICAL results were called by discussed via telephone by Dr. Macy Mis 05/04/2021 with provider Marda Stalker , who verbally acknowledged these results. Electronically Signed   By: Maurine Simmering M.D.   On: 04/26/2021 15:04   CT CERVICAL SPINE WO CONTRAST  Result Date: 04/23/2021 CLINICAL DATA:  Head trauma, abnormal mental status (Age 49-64y); Neck trauma (Age >= 65y) EXAM: CT HEAD WITHOUT CONTRAST CT CERVICAL SPINE WITHOUT CONTRAST TECHNIQUE: Multidetector CT imaging of the head and cervical spine was performed following the standard protocol without intravenous contrast. Multiplanar CT image reconstructions of the cervical spine were also generated. RADIATION DOSE REDUCTION: This exam was performed according to the departmental dose-optimization program which includes  automated exposure control, adjustment of the mA and/or kV according to patient size and/or use of iterative reconstruction technique. COMPARISON:  CT 12/16/2020. FINDINGS: CT HEAD FINDINGS Brain: There is large volume, acute subarachnoid hemorrhage along the left frontotemporal region and to a lesser degree the anterior right frontal lobe. There is an acute, holohemispheric, left-sided subdural hematoma measuring up to 8 mm in thickness (series 5, image 35). There is no significant midline shift. The ventricles are normal in size.Scattered subcortical and periventricular white matter hypodensities, nonspecific but likely sequela of chronic small vessel ischemic disease.Mild cerebral atrophy Vascular: No hyperdense vessel or unexpected calcification. Skull: There is a fracture of the right paramedian aspect of the occipital bone. Sinuses/Orbits: No acute finding. Other: There is a large posterior scalp hematoma. CT CERVICAL SPINE FINDINGS Alignment: Grade 1 anterolisthesis at C6-C7. Trace anterolisthesis at T1-T2. Skull base and vertebrae: There is a nondisplaced skull fracture of the right paramedian occipital bone extending along the base of the skull. There is motion artifact distorting the skull base but there is no evidence of extension into the occipital condyles or colitis Soft tissues and spinal canal: No prevertebral fluid or swelling. No visible canal hematoma. Disc levels: There is multilevel degenerative disc disease, severe at C7-T1. There is multilevel facet arthropathy, moderate-severe. Fused facets at C7-T1. Upper chest: There is interlobular septal thickening and layering pleural effusion seen in the lung apices. Mild paraseptal emphysema. Other: None IMPRESSION: Acute left frontotemporal subarachnoid hemorrhage and to a lesser degree along the right frontal lobe. Acute, holohemispheric left-sided subdural hematoma measuring up to 8 mm in thickness. No significant midline shift. Large posterior  scalp hematoma with underlying skull fracture of the right paramedian aspect of the occipital bone. No acute cervical spine fracture. Multilevel degenerative disc disease and facet arthropathy. Pulmonary edema and layering pleural effusions seen in the lung apices. These CRITICAL results were called by discussed via telephone by Dr. Macy Mis 04/12/2021 with provider Marda Stalker , who verbally acknowledged these results. Electronically Signed   By: Maurine Simmering M.D.   On: 05/01/2021 15:04   DG Chest Portable 1 View  Result Date: 04/30/2021 CLINICAL DATA:  Trauma. EXAM: PORTABLE CHEST 1  VIEW COMPARISON:  Jul 12, 2013. FINDINGS: The heart size and mediastinal contours are within normal limits. Minimal bibasilar subsegmental atelectasis or scarring is noted. Status post bilateral shoulder arthroplasties. IMPRESSION: Minimal bibasilar subsegmental atelectasis or scarring. Electronically Signed   By: Marijo Conception M.D.   On: 04/23/2021 15:13    ROS: Per HPI Blood pressure (!) 167/110, pulse (!) 128, temperature 97.7 F (36.5 C), temperature source Oral, resp. rate (!) 29, height 5\' 6"  (1.676 m), weight 61.2 kg, SpO2 99 %. Physical Exam: The patient is not alert, nonverbal, and unable to follow commands. She responds to noxious stimuli. She has no eye opening. PERRLA. She has purposeful movement in her BUE and BLE. GCS 7. Right occipital hematoma.    Assessment/Plan: 88 y.o. female who presented to the ED following a presumed fall down 2 stairs striking her posterior head on concrete with LOC. She is on Eliquis for A. flutter. CT c-spine and head were reviewed. CT cervical spine revealed degenerative and arthritic changes. No acute fractures or structural abnormalities were appreciated. CT head with acute large left frontotemporal SAH, small right frontal lobe SAH, left-sided SDH measuring up to 8 mm, and large posterior scalp hematoma with underlying occipital fracture. No significant MLS or mass  effect. No acute neurosurgical intervention is indicated at this time. Recommend medical management.   -Reversal of Eliquis with Andexxa per pharmacy -Keppra 500 mg BID X 7 days -Repeat CT head in the morning -Neuro ICU -Frequent neuro checks -Maintain SBP < [place], [place], NP-C 04/30/2021, 4:00 PM

## 2021-04-20 ENCOUNTER — Inpatient Hospital Stay (HOSPITAL_COMMUNITY): Payer: Medicare Other

## 2021-04-20 DIAGNOSIS — E43 Unspecified severe protein-calorie malnutrition: Secondary | ICD-10-CM | POA: Insufficient documentation

## 2021-04-20 DIAGNOSIS — D6869 Other thrombophilia: Secondary | ICD-10-CM

## 2021-04-20 DIAGNOSIS — I4892 Unspecified atrial flutter: Secondary | ICD-10-CM | POA: Diagnosis not present

## 2021-04-20 DIAGNOSIS — S065XAA Traumatic subdural hemorrhage with loss of consciousness status unknown, initial encounter: Principal | ICD-10-CM

## 2021-04-20 LAB — CBC
HCT: 41.9 % (ref 36.0–46.0)
Hemoglobin: 14 g/dL (ref 12.0–15.0)
MCH: 32.5 pg (ref 26.0–34.0)
MCHC: 33.4 g/dL (ref 30.0–36.0)
MCV: 97.2 fL (ref 80.0–100.0)
Platelets: UNDETERMINED 10*3/uL (ref 150–400)
RBC: 4.31 MIL/uL (ref 3.87–5.11)
RDW: 15.8 % — ABNORMAL HIGH (ref 11.5–15.5)
WBC: 10.8 10*3/uL — ABNORMAL HIGH (ref 4.0–10.5)
nRBC: 0 % (ref 0.0–0.2)

## 2021-04-20 LAB — BASIC METABOLIC PANEL
Anion gap: 12 (ref 5–15)
BUN: 20 mg/dL (ref 8–23)
CO2: 20 mmol/L — ABNORMAL LOW (ref 22–32)
Calcium: 8.6 mg/dL — ABNORMAL LOW (ref 8.9–10.3)
Chloride: 106 mmol/L (ref 98–111)
Creatinine, Ser: 1.06 mg/dL — ABNORMAL HIGH (ref 0.44–1.00)
GFR, Estimated: 51 mL/min — ABNORMAL LOW (ref >60)
Glucose, Bld: 133 mg/dL — ABNORMAL HIGH (ref 70–99)
Potassium: 4.7 mmol/L (ref 3.5–5.1)
Sodium: 138 mmol/L (ref 135–145)

## 2021-04-20 LAB — GLUCOSE, CAPILLARY
Glucose-Capillary: 126 mg/dL — ABNORMAL HIGH (ref 70–99)
Glucose-Capillary: 126 mg/dL — ABNORMAL HIGH (ref 70–99)

## 2021-04-20 LAB — PHOSPHORUS: Phosphorus: 4.1 mg/dL (ref 2.5–4.6)

## 2021-04-20 LAB — MAGNESIUM: Magnesium: 1.8 mg/dL (ref 1.7–2.4)

## 2021-04-20 MED ORDER — VITAL 1.5 CAL PO LIQD
1000.0000 mL | ORAL | Status: DC
  Administered 2021-04-20: 1000 mL

## 2021-04-20 MED ORDER — ADULT MULTIVITAMIN LIQUID CH
15.0000 mL | Freq: Every day | ORAL | Status: DC
  Filled 2021-04-20 (×2): qty 15

## 2021-04-20 MED ORDER — SODIUM CHLORIDE 0.9 % IV BOLUS
500.0000 mL | Freq: Once | INTRAVENOUS | Status: AC
  Administered 2021-04-20: 500 mL via INTRAVENOUS

## 2021-04-20 MED ORDER — POTASSIUM CHLORIDE 20 MEQ PO PACK
20.0000 meq | PACK | Freq: Once | ORAL | Status: AC
  Administered 2021-04-20: 20 meq
  Filled 2021-04-20: qty 1

## 2021-04-20 MED ORDER — PROSOURCE TF PO LIQD
45.0000 mL | Freq: Three times a day (TID) | ORAL | Status: DC
  Administered 2021-04-20 – 2021-04-21 (×2): 45 mL
  Filled 2021-04-20: qty 45

## 2021-04-20 MED ORDER — VITAL HIGH PROTEIN PO LIQD: 1000.0000 mL | ORAL | Status: DC

## 2021-04-20 MED ORDER — ADULT MULTIVITAMIN W/MINERALS CH: 1.0000 | ORAL_TABLET | Freq: Every day | ORAL | Status: DC

## 2021-04-20 MED ORDER — METOPROLOL TARTRATE 5 MG/5ML IV SOLN: 5.0000 mg | Freq: Four times a day (QID) | INTRAVENOUS | Status: DC | PRN

## 2021-04-20 MED ORDER — PROSOURCE TF PO LIQD: 45.0000 mL | Freq: Two times a day (BID) | ORAL | Status: DC

## 2021-04-20 NOTE — Procedures (Signed)
Cortrak  Person Inserting Tube:  Esaw Dace, RD Tube Type:  Cortrak - 43 inches Tube Size:  10 Tube Location:  Left nare Initial Placement:  Stomach Secured by: Bridle Technique Used to Measure Tube Placement:  Marking at nare/corner of mouth Cortrak Secured At:  68 cm  Cortrak Tube Team Note:  Consult received to place a Cortrak feeding tube.   X-ray is required, abdominal x-ray has been ordered by the Cortrak team. Please confirm tube placement before using the Cortrak tube.   If the tube becomes dislodged please keep the tube and contact the Cortrak team at [email or web address] ([email or web address]) for replacement.  If after hours and replacement cannot be delayed, place a NG tube and confirm placement with an abdominal x-ray.    Kerman Passey MS, RDN, LDN, CNSC Registered Dietitian III Clinical Nutrition RD Pager and On-Call Pager Number Located in [place]

## 2021-04-20 NOTE — Progress Notes (Signed)
SLP Cancellation Note  Patient Details Name: Danielle [name] MRN: [id number] DOB: 26-Nov-1932   Cancelled treatment:        Unable to arouse for BSE. Will continue efforts tomorrow unless notified by RN she is awake.   Houston Siren 04/20/2021, 11:14 AM  Orbie Pyo Colvin Caroli.Ed Tourist information centre manager [phone]

## 2021-04-20 NOTE — TOC CAGE-AID Note (Signed)
Transition of Care [facility]) - CAGE-AID Screening   Patient Details  Name: Danielle [name] MRN: [id number] Date of Birth: 1932/04/12  Transition of Care [facility]) CM/SW Contact:    Sterling Mondo C Tarpley-Carter, [place] Phone Number: 04/20/2021, 9:00 AM   Clinical Narrative: Pt is unable to participate in Cage Aid. Pt is experiencing dementia.  Nikiya Starn Tarpley-Carter, MSW, LCSW-A Pronouns:  She/Her/Hers [place] Transitions of Care Clinical Social Worker Direct Number:  [phone] [email or web address]  CAGE-AID Screening: Substance Abuse Screening unable to be completed due to: : Patient unable to participate (Pt is experiencing dementia.)             Substance Abuse Education Offered: No

## 2021-04-20 NOTE — Consult Note (Addendum)
Cardiology Consultation:   Patient ID: Danielle [name] MRN: [id number]; DOB: [birth year]  Admit date: 05/08/2021 Date of Consult: 04/20/2021  PCP:  Prince Solian, MD   [facility] Providers Cardiologist:  Shelva Majestic, MD   Patient Profile:   Danielle [name] is a 88 y.o. female with a history of paroxysmal atrial relation/flutter on Eliquis, carotid artery disease s/p CEA in 2005, hypertension, hyperlipidemia, hypothyroidism, and underlying dementia who is being seen 04/20/2021 for the evaluation of atrial fibrillation/flutter at the request of Dr. Grandville Silos  History of Present Illness:   Danielle [name] is a 88 year old female with the above history who is followed by Dr. [name] and Dr. Lovena Le.  Last ischemic evaluation was a Myoview in 06/2013 which was low risk.  Last Echo in 04/2020 showed LVEF of 60-65% with mild LVH, normal RV, mild to moderate MR, mild to moderate TR.  She has a history of atrial fibrillation/flutter and follows with EP for this.  She underwent DCCV for atrial flutter in 08/2020 which was initially successful.  Patient was last seen by Dr. Lovena Le in 09/2020 at which time she reported chronic fatigue but was otherwise stable from a cardiac standpoint.  She was back in likely atrial flutter at that visit her rates were controlled.  Dr. Lovena Le recommended watchful waiting with plan for low-dose amiodarone if symptoms worsen.  Patient presented to the ED on 05/02/2021 via EMS after a fall and was found to a large left frontotemporal subarachnoid hemorrhage, right frontal subarachnoid range, left subdural hematoma, and a large posterior scalp hematoma with underlying occipital skull fracture.  Eliquis was reversed with Andexxa and neurosurgery was consulted.  No plans for surgical intervention at this time.  She is not felt to be a candidate to restart Eliquis given multiple falls.  Patient went into rapid atrial fibrillation/flutter overnight requiring IV Amiodarone.  Cardiology consulted for  further evaluation.  At the time of this evaluation, patient in no acute distress but she is not responsive.  She would not open her eyes for me or respond to firm sternal rub.  She is maintaining her airway on her own though.  Daughter and son-in-law are at bedside but they were not present for patient's fall.  History obtained through chart review. Patient was reportedly walking down 2 steps leading to a garage when she fell and hit the back of her head on the concrete.  She was unresponsive according to her husband and reportedly appeared "blue" when he got to her about 10 seconds after the fall.  Patient has underlying dementia and husband told admitting provider that she has had a significant decline in her mental status over the last week and has been talking to her deceased siblings.  However, daughter describes good days and bad days.  Daughter states patient has not complained of any cardiac symptoms recently.  She has not complained of any chest pain or shortness of breath.  She did have another fall in a store back in October and also hit the back of her head at that time.   Past Medical History:  Diagnosis Date   Anxiety    Arthritis    Right shoulder arthritis   Asthma    Atrial fibrillation ([place])    Carotid artery disease ([facility])    Chronic anticoagulation    Diverticulitis    Dysrhythmia    Headache(784.0)    Heart murmur    Hypothyroidism    Memory impairment    Osteoarthritis of left shoulder  07/20/2013   Peripheral vascular disease ([place])    carotid    Primary osteoarthritis of right shoulder 11/16/2013   Seasonal allergies     Past Surgical History:  Procedure Laterality Date   BREAST EXCISIONAL BIOPSY Left    CARDIOVERSION N/A 08/29/2020   Procedure: CARDIOVERSION;  Surgeon: [name], MD;  Location: [facility] ENDOSCOPY;  Service: Cardiovascular;  Laterality: N/A;   CAROTID ENDARTERECTOMY Right    CATARACT EXTRACTION W/ INTRAOCULAR LENS  IMPLANT, BILATERAL     COLON  SURGERY     COLON SURGERY  2005   had a colostomy for 6 months    COLONOSCOPY     EYE SURGERY Bilateral    cataracts    GASTRIC RESTRICTION SURGERY Left 2006   TONSILLECTOMY     TOTAL SHOULDER ARTHROPLASTY Left 07/20/2013   Procedure: TOTAL SHOULDER ARTHROPLASTY;  Surgeon: [name], MD;  Location: [place];  Service: Orthopedics;  Laterality: Left;   TOTAL SHOULDER ARTHROPLASTY Right 11/16/2013   Dr [name]   TOTAL SHOULDER ARTHROPLASTY Right 11/16/2013   Procedure: RIGHT TOTAL SHOULDER ARTHROPLASTY;  Surgeon: [name], MD;  Location: [place];  Service: Orthopedics;  Laterality: Right;     Home Medications:  Prior to Admission medications   Medication Sig Start Date End Date Taking? Authorizing Provider  acetaminophen (TYLENOL) 500 MG tablet Take 500 mg by mouth every 6 (six) hours as needed for moderate pain.    [provider]  calcium carbonate (OS-CAL) 600 MG TABS tablet Take 600 mg by mouth daily with breakfast.     [provider]  cetirizine (ZYRTEC) 10 MG tablet Take 10 mg by mouth daily as needed for allergies.    [provider]  Cholecalciferol (VITAMIN D) 125 MCG (5000 UT) CAPS Take 5,000 Units by mouth every 7 (seven) days.    [provider]  ELIQUIS 5 MG TABS tablet Take 5 mg by mouth 2 (two) times daily. 03/14/20   [provider]  fluticasone (FLONASE) 50 MCG/ACT nasal spray Place 2 sprays into both nostrils daily as needed for allergies. 04/28/13   [provider]  guaiFENesin (MUCINEX) 600 MG 12 hr tablet Take 1,200 mg by mouth daily.    [provider]  hydroxypropyl methylcellulose / hypromellose (ISOPTO TEARS / GONIOVISC) 2.5 % ophthalmic solution Place 1 drop into both eyes 3 (three) times daily as needed for dry eyes.    [provider]  levothyroxine (SYNTHROID) 100 MCG tablet Take 1 tablet (100 mcg total) by mouth daily before breakfast. 09/26/20   [name], MD  Multiple Vitamin  (MULTIVITAMIN WITH MINERALS) TABS tablet Take 1 tablet by mouth daily.    [provider]  nebivolol (BYSTOLIC) 10 MG tablet Take 1 tablet (10 mg total) by mouth daily. 08/14/20   [name], MD  rosuvastatin (CRESTOR) 5 MG tablet Take 5 mg by mouth daily.    [provider]    Inpatient Medications: Scheduled Meds:  chlorhexidine  15 mL Mouth Rinse BID   Chlorhexidine Gluconate Cloth  6 each Topical Q0600   feeding supplement (PROSource TF)  45 mL Per Tube TID   feeding supplement (VITAL 1.5 CAL)  1,000 mL Per Tube Q24H   [START ON May 11, 2021] levothyroxine  50 mcg Intravenous Daily   mouth rinse  15 mL Mouth Rinse q12n4p   multivitamin  15 mL Per Tube Daily   pantoprazole  40 mg Oral Daily   Or   pantoprazole (PROTONIX)  IV  40 mg Intravenous Daily   potassium chloride  20 mEq Per Tube Once   Continuous Infusions:  0.9 % NaCl with KCl 20 mEq / L 75 mL/hr at 04/20/21 0900   amiodarone 30 mg/hr (04/20/21 0900)   levETIRAcetam Stopped (04/20/21 0507)   PRN Meds: hydrALAZINE, metoprolol tartrate, morphine injection, morphine injection, ondansetron **OR** ondansetron (ZOFRAN) IV, polyvinyl alcohol  Allergies:   No Known Allergies  Social History:   Social History   Socioeconomic History   Marital status: Married    Spouse name: Not on file   Number of children: Not on file   Years of education: Not on file   Highest education level: Not on file  Occupational History   Not on file  Tobacco Use   Smoking status: Former    Packs/day: 0.25    Years: 2.00    Pack years: 0.50    Types: Cigarettes    Quit date: 07/13/1963    Years since quitting: 57.8   Smokeless tobacco: Never   Tobacco comments:    quit aprrox 50 years ago.  Vaping Use   Vaping Use: Never used  Substance and Sexual Activity   Alcohol use: No   Drug use: No   Sexual activity: Not on file  Other Topics Concern   Not on file  Social History Narrative   Not on file   Social  Determinants of Health   Financial Resource Strain: Not on file  Food Insecurity: Not on file  Transportation Needs: Not on file  Physical Activity: Not on file  Stress: Not on file  Social Connections: Not on file  Intimate Partner Violence: Not on file    Family History:   Family History  Problem Relation Age of Onset   Cancer - Lung Mother    Cancer - Other Other      ROS:  Please see the history of present illness.  Review of Systems  Unable to perform ROS: Patient unresponsive   Physical Exam/Data:   Vitals:   04/20/21 0600 04/20/21 0700 04/20/21 0800 04/20/21 0900  BP: 103/64 104/66 101/67 94/62  Pulse: 83 80 77 75  Resp: 19 18 18 18   Temp:   (!) 97.5 F (36.4 C)   TempSrc:   Axillary   SpO2: 96% 96% 95% 96%  Weight:      Height:        Intake/Output Summary (Last 24 hours) at 04/20/2021 1152 Last data filed at 04/20/2021 0900 Gross per 24 hour  Intake 2588.96 ml  Output 510 ml  Net 2078.96 ml   Last 3 Weights 04/28/2021 09/29/2020 09/26/2020  Weight (lbs) 135 lb 141 lb 3.2 oz 140 lb 9.6 oz  Weight (kg) 61.236 kg 64.048 kg 63.776 kg     Body mass index is 21.79 kg/m.  General: 88 y.o. Caucasian female  who is unresponsive. HEENT: Normocephalic and atraumatic. Sclera clear.  Neck: Supple.  No JVD. Heart: Irregular rhythm with normal rate. Distinct S1 and S2. No murmurs, gallops, or rubs.  Lungs: No increased work of breathing. Clear to ausculation bilaterally. No wheezes, rhonchi, or rales.  Abdomen: Soft, non-distended, and non-tender to palpation.  Extremities: No lower extremity edema.    Skin: Warm and dry. Neuro: Unresponsive. Psych: Unresponsive.   EKG:  The EKG was personally reviewed and demonstrates: Initial EKG on 04/23/2021 showed atrial fibrillation/flutter rate 113 bpm, nonspecific ST/T changes.  Repeat EKG on 04/20/2021 is automatically read as sinus tachycardia with second-degree AV block  and 2:1 AV conduction; however, my review looks like this  may be just a slower atrial flutter with rate of 74 bpm (P waves march out but QRS segments are irregular). Telemetry:  Telemetry was personally reviewed and demonstrates: Atrial flutter with rates as high as the 140s overnight but currently well controlled in the 70s to 80s.  Relevant CV Studies:  Myoview 06/30/2013: - Rest ECG: NSR with non-specific ST-T wave changes - Stress ECG: No significant change from baseline ECG and No significant ST segment change suggestive of ischemia.   QPS Raw Data Images:  Combination of motion artifact and acquisition artifact is noted. An raw data, halfway through the rotation the intensity of tracer uptake is dramatically enhanced from midline to the right rotation.  Stress Images:  There is decreased uptake in the septum. there is a small to moderate sized mild intensity perfusion defect in the basal septal wall as well as the basal inferolateral wall. While the perfusion defects do appear to be more pronounced in stress versus rest imaging, they do not follow a specific vessel distribution in the absence of bypass grafting. Rest Images:  Comparison with the stress images reveals no significant change. Subtraction (SDS):  There is no evidence of scar or ischemia.   Impression Exercise Capacity:  Lexiscan with no exercise. BP Response:  Normal blood pressure response. Clinical Symptoms:  There is dyspnea. ECG Impression:  No significant ECG changes with Lexiscan. LV Wall Motion:  NL LV Function; NL Wall Motion; small ventricle size/40 likely least overestimated LVEF Comparison with Prior Nuclear Study: No significant change from previous study   Overall Impression:  Low risk stress nuclear study , with likely artifact related lack of tracer uptake in the basal septal wall. _______________  Echocardiogram 04/26/2020: Impressions:  1. Left ventricular ejection fraction, by estimation, is 60 to 65%. The  left ventricle has normal function. The left  ventricle has no regional  wall motion abnormalities. There is mild concentric left ventricular  hypertrophy. Left ventricular diastolic  function could not be evaluated.   2. Right ventricular systolic function is normal. The right ventricular  size is normal. There is normal pulmonary artery systolic pressure.   3. Left atrial size was mild to moderately dilated.   4. Right atrial size was mildly dilated.   5. The mitral valve is degenerative. Mild to moderate mitral valve  regurgitation. Moderate mitral annular calcification.   6. Tricuspid valve regurgitation is mild to moderate.   7. The aortic valve is tricuspid. There is mild calcification of the  aortic valve. There is mild thickening of the aortic valve. Aortic valve  regurgitation is mild. Mild to moderate aortic valve  sclerosis/calcification is present, without any evidence  of aortic stenosis.   8. The inferior vena cava is normal in size with <50% respiratory  variability, suggesting right atrial pressure of 8 mmHg.   Comparison(s): No significant change from prior study.  _______________  Carotid Artery Stenosis 04/26/2020: Summary:  - Right Carotid: Velocities in the right ICA are consistent with a stable 1-39% stenosis. S/p CEA.  - Left  Carotid: Velocities in the left ICA are consistent with a stable 1-39% stenosis.  - Vertebrals:  Bilateral vertebral arteries demonstrate antegrade flow.  - Subclavians: Normal flow hemodynamics were seen in bilateral subclavian arteries.   Laboratory Data:  High Sensitivity Troponin:  No results for input(s): TROPONINIHS in the last 720 hours.   Chemistry Recent Labs  Lab 04/23/2021 1420 05/06/2021 1431 04/20/21 1011  NA 138 140 138  K 3.6 3.7 4.7  CL 106 106 106  CO2 23  --  20*  GLUCOSE 122* 118* 133*  BUN 15 16 20   CREATININE 0.90 0.80 1.06*  CALCIUM 8.8*  --  8.6*  GFRNONAA >60  --  51*  ANIONGAP 9  --  12    Recent Labs  Lab 04/28/2021 1420  PROT 6.5  ALBUMIN 3.1*   AST 59*  ALT 33  ALKPHOS 154*  BILITOT 2.0*   Lipids No results for input(s): CHOL, TRIG, HDL, LABVLDL, LDLCALC, CHOLHDL in the last 168 hours.  Hematology Recent Labs  Lab 05/01/2021 1420 04/30/2021 1431 04/20/21 1011  WBC 6.4  --  10.8*  RBC 4.39  --  4.31  HGB 14.2 15.0 14.0  HCT 41.9 44.0 41.9  MCV 95.4  --  97.2  MCH 32.3  --  32.5  MCHC 33.9  --  33.4  RDW 15.2  --  15.8*  PLT 103*  --  PLATELET CLUMPS NOTED ON SMEAR, UNABLE TO ESTIMATE   Thyroid No results for input(s): TSH, FREET4 in the last 168 hours.  BNPNo results for input(s): BNP, PROBNP in the last 168 hours.  DDimer No results for input(s): DDIMER in the last 168 hours.   Radiology/Studies:  CT HEAD WO CONTRAST  Result Date: 04/20/2021 CLINICAL DATA:  Follow-up intracranial hemorrhage EXAM: CT HEAD WITHOUT CONTRAST TECHNIQUE: Contiguous axial images were obtained from the base of the skull through the vertex without intravenous contrast. RADIATION DOSE REDUCTION: This exam was performed according to the departmental dose-optimization program which includes automated exposure control, adjustment of the mA and/or kV according to patient size and/or use of iterative reconstruction technique. COMPARISON:  Yesterday FINDINGS: Brain: Subdural hematoma along the left cerebral convexity with high-density component measuring up to 12 mm thickness, increased in extent and thickness from before. Multifocal subarachnoid hemorrhage centered on the frontal lobes, asymmetric to the left where there is also blooming parenchymal contusion causing ill-defined low-density and gyral thickening. Trace right-sided subdural hemorrhage along the posterior falx. Rightward midline shift has increased to 7 mm. No entrapment or complicating infarct. Vascular: No hyperdense vessel or unexpected calcification. Skull: Scalp swelling on the right posteriorly neighboring a linear occipital bone fracture which is nondepressed. Sinuses/Orbits: No acute  finding These results were called by telephone at the time of interpretation on 04/20/2021 at 5:02 am to provider NP Glenford Peers, who verbally acknowledged these results. IMPRESSION: Increased subdural hemorrhage especially along the left cerebral convexity with thickness measures up to 12 mm. This and blooming frontal contusion cause midline shift of 7 mm. Electronically Signed   By: Jorje Guild M.D.   On: 04/20/2021 05:13   CT HEAD WO CONTRAST  Result Date: 04/14/2021 CLINICAL DATA:  Head trauma, abnormal mental status (Age 67-64y); Neck trauma (Age >= 65y) EXAM: CT HEAD WITHOUT CONTRAST CT CERVICAL SPINE WITHOUT CONTRAST TECHNIQUE: Multidetector CT imaging of the head and cervical spine was performed following the standard protocol without intravenous contrast. Multiplanar CT image reconstructions of the cervical spine were also generated. RADIATION DOSE REDUCTION: This exam was performed according to the departmental dose-optimization program which includes automated exposure control, adjustment of the mA and/or kV according to patient size and/or use of iterative reconstruction technique. COMPARISON:  CT 12/16/2020. FINDINGS: CT HEAD FINDINGS Brain: There is large volume, acute subarachnoid hemorrhage along the left frontotemporal region and to a lesser degree the anterior right frontal lobe. There is an acute, holohemispheric, left-sided subdural  hematoma measuring up to 8 mm in thickness (series 5, image 35). There is no significant midline shift. The ventricles are normal in size.Scattered subcortical and periventricular white matter hypodensities, nonspecific but likely sequela of chronic small vessel ischemic disease.Mild cerebral atrophy Vascular: No hyperdense vessel or unexpected calcification. Skull: There is a fracture of the right paramedian aspect of the occipital bone. Sinuses/Orbits: No acute finding. Other: There is a large posterior scalp hematoma. CT CERVICAL SPINE FINDINGS Alignment: Grade  1 anterolisthesis at C6-C7. Trace anterolisthesis at T1-T2. Skull base and vertebrae: There is a nondisplaced skull fracture of the right paramedian occipital bone extending along the base of the skull. There is motion artifact distorting the skull base but there is no evidence of extension into the occipital condyles or colitis Soft tissues and spinal canal: No prevertebral fluid or swelling. No visible canal hematoma. Disc levels: There is multilevel degenerative disc disease, severe at C7-T1. There is multilevel facet arthropathy, moderate-severe. Fused facets at C7-T1. Upper chest: There is interlobular septal thickening and layering pleural effusion seen in the lung apices. Mild paraseptal emphysema. Other: None IMPRESSION: Acute left frontotemporal subarachnoid hemorrhage and to a lesser degree along the right frontal lobe. Acute, holohemispheric left-sided subdural hematoma measuring up to 8 mm in thickness. No significant midline shift. Large posterior scalp hematoma with underlying skull fracture of the right paramedian aspect of the occipital bone. No acute cervical spine fracture. Multilevel degenerative disc disease and facet arthropathy. Pulmonary edema and layering pleural effusions seen in the lung apices. These CRITICAL results were called by discussed via telephone by Dr. Macy Mis 05/01/2021 with provider Marda Stalker , who verbally acknowledged these results. Electronically Signed   By: Maurine Simmering M.D.   On: 04/25/2021 15:04   CT CERVICAL SPINE WO CONTRAST  Result Date: 05/01/2021 CLINICAL DATA:  Head trauma, abnormal mental status (Age 54-64y); Neck trauma (Age >= 65y) EXAM: CT HEAD WITHOUT CONTRAST CT CERVICAL SPINE WITHOUT CONTRAST TECHNIQUE: Multidetector CT imaging of the head and cervical spine was performed following the standard protocol without intravenous contrast. Multiplanar CT image reconstructions of the cervical spine were also generated. RADIATION DOSE REDUCTION: This  exam was performed according to the departmental dose-optimization program which includes automated exposure control, adjustment of the mA and/or kV according to patient size and/or use of iterative reconstruction technique. COMPARISON:  CT 12/16/2020. FINDINGS: CT HEAD FINDINGS Brain: There is large volume, acute subarachnoid hemorrhage along the left frontotemporal region and to a lesser degree the anterior right frontal lobe. There is an acute, holohemispheric, left-sided subdural hematoma measuring up to 8 mm in thickness (series 5, image 35). There is no significant midline shift. The ventricles are normal in size.Scattered subcortical and periventricular white matter hypodensities, nonspecific but likely sequela of chronic small vessel ischemic disease.Mild cerebral atrophy Vascular: No hyperdense vessel or unexpected calcification. Skull: There is a fracture of the right paramedian aspect of the occipital bone. Sinuses/Orbits: No acute finding. Other: There is a large posterior scalp hematoma. CT CERVICAL SPINE FINDINGS Alignment: Grade 1 anterolisthesis at C6-C7. Trace anterolisthesis at T1-T2. Skull base and vertebrae: There is a nondisplaced skull fracture of the right paramedian occipital bone extending along the base of the skull. There is motion artifact distorting the skull base but there is no evidence of extension into the occipital condyles or colitis Soft tissues and spinal canal: No prevertebral fluid or swelling. No visible canal hematoma. Disc levels: There is multilevel degenerative disc disease, severe at C7-T1. There is multilevel  facet arthropathy, moderate-severe. Fused facets at C7-T1. Upper chest: There is interlobular septal thickening and layering pleural effusion seen in the lung apices. Mild paraseptal emphysema. Other: None IMPRESSION: Acute left frontotemporal subarachnoid hemorrhage and to a lesser degree along the right frontal lobe. Acute, holohemispheric left-sided subdural  hematoma measuring up to 8 mm in thickness. No significant midline shift. Large posterior scalp hematoma with underlying skull fracture of the right paramedian aspect of the occipital bone. No acute cervical spine fracture. Multilevel degenerative disc disease and facet arthropathy. Pulmonary edema and layering pleural effusions seen in the lung apices. These CRITICAL results were called by discussed via telephone by Dr. Macy Mis 05/04/2021 with provider Marda Stalker , who verbally acknowledged these results. Electronically Signed   By: Maurine Simmering M.D.   On: 05/03/2021 15:04   DG Chest Portable 1 View  Result Date: 04/20/2021 CLINICAL DATA:  Trauma. EXAM: PORTABLE CHEST 1 VIEW COMPARISON:  Jul 12, 2013. FINDINGS: The heart size and mediastinal contours are within normal limits. Minimal bibasilar subsegmental atelectasis or scarring is noted. Status post bilateral shoulder arthroplasties. IMPRESSION: Minimal bibasilar subsegmental atelectasis or scarring. Electronically Signed   By: Marijo Conception M.D.   On: 04/26/2021 15:13     Assessment and Plan:   Persistent Atrial Fibrillation/Flutter Patient was admitted for large subarachnoid hemorrhage and subdural hematoma after falling and hitting the back of her head. Eliquis had to be reversed using Andexxa.  Cardiology was consulted for management of atrial fibrillation/flutter.   Reviewed telemetry and it looks like likely atrial flutter rates as high as the 140s overnight.  She was started on IV amiodarone and rates currently in the 70s to 80s.  Repeat EKG this morning is automated automatically read as sinus tachycardia with second-degree AV block and 2: 1 AV conduction.  However, on my personal review, I think this is likely a slower atrial flutter with ventricular rate of 74 bpm (P waves march out consistently but the QRS complexes are irregular and flutter waves look similar to review of telemetry when rates were in the 140s). Will review with  MD.  Continue IV Amiodarone for now.  Will change IV Lopressor to as needed. Patient has underlying dementia and has had multiple falls in the past so I do not think she is a candidate to restart Eliquis even if she recovers from this event.  Otherwise, per primary team: - Traumatic brain injury  - Subarachnoid hemorrhage/ subdural hematoma - Carotid stenosis s/p CEA - Hypothyroidism - Hypertension - Hyperlipidemia - Dementia   Risk Assessment/Risk Scores:    CHA2DS2-VASc Score = 4  This indicates a 4.8% annual risk of stroke. The patient's score is based upon: CHF History: 0 HTN History: 1 Diabetes History: 0 Stroke History: 0 Vascular Disease History: 0 Age Score: 2 Gender Score: 1   For questions or updates, please contact [place] Please consult [email or web address] for contact info under    Signed, Darreld Mclean, PA-C  04/20/2021 11:52 AM  I have examined the patient and reviewed assessment and plan and discussed with patient.  Agree with above as stated.    Rate controlled atrial flutter on IV Amio.  She is not very responsive to voice.  Continue supportive care.  Not a candidate for anticoagulation going forward.   If any bradycardia, would stop IV Amio.  Otherwise,should be able to stop tomorrow.  Could consider PO meds if they can be given via NG tube.     Larae Grooms

## 2021-04-20 NOTE — Progress Notes (Addendum)
Patient ID: Danielle [name], female   DOB: 08-Apr-1932, 88 y.o.   MRN: [id number] Follow up - Trauma Critical Care   Patient Details:    Danielle [name] is an 88 y.o. female.  Lines/tubes : External Urinary Catheter (Active)  Collection Container Dedicated Suction Canister 04/18/2021 2000  Suction (Verified suction is between 40-80 mmHg) Yes 04/12/2021 1700  Securement Method Mesh underwear 04/30/2021 2000  Site Assessment Clean;Intact 05/07/2021 2000  Intervention Female External Urinary Catheter Replaced 04/29/2021 1700  Output (mL) 450 mL 04/18/2021 1800    Microbiology/Sepsis markers: Results for orders placed or performed during the hospital encounter of 05/08/2021  Resp Panel by RT-PCR (Flu A&B, Covid) Nasopharyngeal Swab     Status: None   Collection Time: 04/25/2021  2:10 PM   Specimen: Nasopharyngeal Swab; Nasopharyngeal(NP) swabs in vial transport medium  Result Value Ref Range Status   SARS Coronavirus 2 by RT PCR NEGATIVE NEGATIVE Final    Comment: (NOTE) SARS-CoV-2 target nucleic acids are NOT DETECTED.  The SARS-CoV-2 RNA is generally detectable in upper respiratory specimens during the acute phase of infection. The lowest concentration of SARS-CoV-2 viral copies this assay can detect is 138 copies/mL. A negative result does not preclude SARS-Cov-2 infection and should not be used as the sole basis for treatment or other patient management decisions. A negative result may occur with  improper specimen collection/handling, submission of specimen other than nasopharyngeal swab, presence of viral mutation(s) within the areas targeted by this assay, and inadequate number of viral copies(<138 copies/mL). A negative result must be combined with clinical observations, patient history, and epidemiological information. The expected result is Negative.  Fact Sheet for Patients:  EntrepreneurPulse.com.au  Fact Sheet for Healthcare Providers:   IncredibleEmployment.be  This test is no t yet approved or cleared by the [place] FDA and  has been authorized for detection and/or diagnosis of SARS-CoV-2 by FDA under an Emergency Use Authorization (EUA). This EUA will remain  in effect (meaning this test can be used) for the duration of the COVID-19 declaration under Section 564(b)(1) of the Act, 21 U.S.C.section 360bbb-3(b)(1), unless the authorization is terminated  or revoked sooner.       Influenza A by PCR NEGATIVE NEGATIVE Final   Influenza B by PCR NEGATIVE NEGATIVE Final    Comment: (NOTE) The Xpert Xpress SARS-CoV-2/FLU/RSV plus assay is intended as an aid in the diagnosis of influenza from Nasopharyngeal swab specimens and should not be used as a sole basis for treatment. Nasal washings and aspirates are unacceptable for Xpert Xpress SARS-CoV-2/FLU/RSV testing.  Fact Sheet for Patients: EntrepreneurPulse.com.au  Fact Sheet for Healthcare Providers: IncredibleEmployment.be  This test is not yet approved or cleared by the [place] FDA and has been authorized for detection and/or diagnosis of SARS-CoV-2 by FDA under an Emergency Use Authorization (EUA). This EUA will remain in effect (meaning this test can be used) for the duration of the COVID-19 declaration under Section 564(b)(1) of the Act, 21 U.S.C. section 360bbb-3(b)(1), unless the authorization is terminated or revoked.  Performed at [facility], [place] [address] [place], [place] [address]   MRSA Next Gen by PCR, Nasal     Status: None   Collection Time: 05/02/2021  4:58 PM   Specimen: Nasal Mucosa; Nasal Swab  Result Value Ref Range Status   MRSA by PCR Next Gen NOT DETECTED NOT DETECTED Final    Comment: (NOTE) The GeneXpert MRSA Assay (FDA approved for NASAL specimens only), is one component of  a comprehensive MRSA colonization surveillance program. It is not intended to diagnose  MRSA infection nor to guide or monitor treatment for MRSA infections. Test performance is not FDA approved in patients less than 8 years old. Performed at [facility], [place] [address], [place], [place] [address]     Anti-infectives:  Anti-infectives (From admission, onward)    None       Best Practice/Protocols:  VTE Prophylaxis: Mechanical .  Consults: Treatment Team:  Vallarie Mare, MD   Subjective:    Overnight Issues:   Objective:  Vital signs for last 24 hours: Temp:  [97.7 F (36.5 C)-99.3 F (37.4 C)] 99.3 F (37.4 C) (02/10 0400) Pulse Rate:  [80-144] 80 (02/10 0700) Resp:  [16-30] 18 (02/10 0700) BP: (95-168)/(54-115) 104/66 (02/10 0700) SpO2:  [91 %-100 %] 96 % (02/10 0700) Weight:  [61.2 kg] 61.2 kg (02/09 1552)  Hemodynamic parameters for last 24 hours:    Intake/Output from previous day: 02/09 0701 - 02/10 0700 In: 2200.6 [I.V.:994; IV Piggyback:1206.6] Out: 510 [Urine:450; Emesis/NG output:60]  Intake/Output this shift: No intake/output data recorded.  Vent settings for last 24 hours:    Physical Exam:  General: no respiratory distress Neuro: opened eyes to stim, localizes LUE, not F/C HEENT/Neck: no JVD Resp: clear to auscultation bilaterally CVS: IRR and HR 70s GI: soft, nontender, BS WNL, no r/g Extremities: no edema  Results for orders placed or performed during the hospital encounter of 05/08/2021 (from the past 24 hour(s))  Resp Panel by RT-PCR (Flu A&B, Covid) Nasopharyngeal Swab     Status: None   Collection Time: 04/11/2021  2:10 PM   Specimen: Nasopharyngeal Swab; Nasopharyngeal(NP) swabs in vial transport medium  Result Value Ref Range   SARS Coronavirus 2 by RT PCR NEGATIVE NEGATIVE   Influenza A by PCR NEGATIVE NEGATIVE   Influenza B by PCR NEGATIVE NEGATIVE  Urinalysis, Routine w reflex microscopic Urine, Clean Catch     Status: Abnormal   Collection Time: 04/20/2021  2:10 PM  Result Value Ref Range   Color,  Urine YELLOW YELLOW   APPearance CLEAR CLEAR   Specific Gravity, Urine 1.010 1.005 - 1.030   pH 6.0 5.0 - 8.0   Glucose, UA NEGATIVE NEGATIVE mg/dL   Hgb urine dipstick SMALL (A) NEGATIVE   Bilirubin Urine NEGATIVE NEGATIVE   Ketones, ur 20 (A) NEGATIVE mg/dL   Protein, ur NEGATIVE NEGATIVE mg/dL   Nitrite NEGATIVE NEGATIVE   Leukocytes,Ua NEGATIVE NEGATIVE   RBC / HPF 0-5 0 - 5 RBC/hpf   WBC, UA 0-5 0 - 5 WBC/hpf   Bacteria, UA NONE SEEN NONE SEEN  Comprehensive metabolic panel     Status: Abnormal   Collection Time: 04/15/2021  2:20 PM  Result Value Ref Range   Sodium 138 135 - 145 mmol/L   Potassium 3.6 3.5 - 5.1 mmol/L   Chloride 106 98 - 111 mmol/L   CO2 23 22 - 32 mmol/L   Glucose, Bld 122 (H) 70 - 99 mg/dL   BUN 15 8 - 23 mg/dL   Creatinine, Ser 0.90 0.44 - 1.00 mg/dL   Calcium 8.8 (L) 8.9 - 10.3 mg/dL   Total Protein 6.5 6.5 - 8.1 g/dL   Albumin 3.1 (L) 3.5 - 5.0 g/dL   AST 59 (H) 15 - 41 U/L   ALT 33 0 - 44 U/L   Alkaline Phosphatase 154 (H) 38 - 126 U/L   Total Bilirubin 2.0 (H) 0.3 - 1.2 mg/dL  GFR, Estimated >60 >60 mL/min   Anion gap 9 5 - 15  CBC     Status: Abnormal   Collection Time: 04/21/2021  2:20 PM  Result Value Ref Range   WBC 6.4 4.0 - 10.5 K/uL   RBC 4.39 3.87 - 5.11 MIL/uL   Hemoglobin 14.2 12.0 - 15.0 g/dL   HCT 41.9 36.0 - 46.0 %   MCV 95.4 80.0 - 100.0 fL   MCH 32.3 26.0 - 34.0 pg   MCHC 33.9 30.0 - 36.0 g/dL   RDW 15.2 11.5 - 15.5 %   Platelets 103 (L) 150 - 400 K/uL   nRBC 0.0 0.0 - 0.2 %  I-Stat Chem 8, ED     Status: Abnormal   Collection Time: 04/24/2021  2:31 PM  Result Value Ref Range   Sodium 140 135 - 145 mmol/L   Potassium 3.7 3.5 - 5.1 mmol/L   Chloride 106 98 - 111 mmol/L   BUN 16 8 - 23 mg/dL   Creatinine, Ser 0.80 0.44 - 1.00 mg/dL   Glucose, Bld 118 (H) 70 - 99 mg/dL   Calcium, Ion 1.04 (L) 1.15 - 1.40 mmol/L   TCO2 23 22 - 32 mmol/L   Hemoglobin 15.0 12.0 - 15.0 g/dL   HCT 44.0 36.0 - 46.0 %  MRSA Next Gen by PCR, Nasal      Status: None   Collection Time: 04/30/2021  4:58 PM   Specimen: Nasal Mucosa; Nasal Swab  Result Value Ref Range   MRSA by PCR Next Gen NOT DETECTED NOT DETECTED  Sample to Blood Bank     Status: None   Collection Time: 04/14/2021  5:02 PM  Result Value Ref Range   Blood Bank Specimen SAMPLE AVAILABLE FOR TESTING    Sample Expiration      04/20/2021,2359 Performed at [facility], [place] [address], [place], [place] [address]   Ethanol     Status: None   Collection Time: 04/23/2021  5:12 PM  Result Value Ref Range   Alcohol, Ethyl (B) <10 <10 mg/dL  Lactic acid, plasma     Status: None   Collection Time: 04/14/2021  5:12 PM  Result Value Ref Range   Lactic Acid, Venous 1.9 0.5 - 1.9 mmol/L    Assessment & Plan: Present on Admission:  Subdural hematoma    LOS: 1 day   Additional comments:I reviewed the patient's new clinical lab test results. And F/U CT H Unwitnessed Fall TBI/SAH/SDH - Eliquis reversed with Andexxa. NSGY, Dr. Marcello Moores following. F/U CT H with enlarged SAH and 14mm shift. Exam stable for me. GCS 9.I D/W NS team on the unit.  Keppra for seizure prophylaxis. Occipital Skull fracture with scalp hematoma - Per NSGY, Dr. Marcello Moores Hx A. Flutter on Eliquis - reversed w/ Andexxa.  Not a candidate for Eliquis to be restarted due to multiple falls. Now on amiodarone drip. I consulted Cardiology. She sees Dr. [name]. Check EKG for QTc Hx CAD Hx asthma Hx hypothyroidism - IV synthroid Hx HTN - lopressor scheduled Hx HLD Elevated LFTs - chronic appearing, ? Related to crestor FEN - NPO, IVFs, place CorTrak and start tube feeds, replete mild hypokalemia VTE - SCDs, anticoagulation and chemical prophylaxis on hold due to TBI ID - none currently needed Foley - None currently. Monitor I/O Dispo - ICU, TBI therapies, ongoing [place] discussion with family.  Critical Care Total Time*: 22 Minutes  Georganna Skeans, MD, MPH, FACS Trauma & General Surgery Use [email or web address] to  contact on  call provider  04/20/2021  *Care during the described time interval was provided by me. I have reviewed this patient's available data, including medical history, events of note, physical examination and test results as part of my evaluation.

## 2021-04-20 NOTE — Progress Notes (Signed)
Patient ID: Danielle [name], female   DOB: Apr 20, 1932, 88 y.o.   MRN: [id number] I tried to call her husband for a clinical update and furhter GOC discussion but the number in the chart is out of service. I then called her daughter, Danielle [name], but got no answer.  Georganna Skeans, MD, MPH, FACS Please use [email or web address] to contact on call provider

## 2021-04-20 NOTE — Progress Notes (Signed)
EKG CRITICAL VALUE     12 lead EKG performed.  Critical value noted.  Roselyn Reef, RN notified.   Willaim Rayas, CCT 04/20/2021 9:36 AM

## 2021-04-20 NOTE — Progress Notes (Signed)
Neurosurgery  I had a long discussion with the patient's daughter [name] over the phone at [phone].  I explained that with Haislee's brain injuries to her left frontal and temporal lobe and current deficits of aphasia and right hemiplegia, her prognosis for meaningful functional recovery is dismal.  She has 1 cm left SDH with associated midline shift but I would not recommend craniotomy for this as evacuation of this will not change her chances for restoration of good performance and functional status given her age and dense neurologic deficits.  She communicated to me she believes the patient has an established DNR order.  I explained that it certainly would be appropriate to have discussions with the palliative care team, so I will place the consult.  All questions and concerns were answered.

## 2021-04-20 NOTE — Progress Notes (Signed)
Pt had sudden drop in HR to the 60s at 1648, whereas before her HR was consistently in the 90s. She had small volume emesis shortly after, undigested tube feed X1. Dr. Grandville Silos and Dr. Marcello Moores notified. No Zofran given, pt with prolonged Qtc.   Amio gtt and TF D/C at this time.

## 2021-04-20 NOTE — Progress Notes (Signed)
Neurosurgery  Eyes open to stim, pupils reactive Localizes LUE, w/d LLE, no mvt RUE/RLE.  Says "oww" but not making sentences Breathing comfortably  88 yo F with dementia on Eliquis s/p fall with left frontotemporal contusions and L SDH.  Repeat CT head shows redistribution of blood, increased SDH.  Given her age and baseline, a large craniotomy for her SDH would offer little palliative benefit.  Currently, goals of care discussions are ongoing, with husband leaning towards more palliative care which would be appropriate in this scenario.  She will need a repeat CT head tomorrow.  If aggressive therapies continued to be pursued, we can consider repeat CT head in 1-2 weeks to see if bur hole drainage feasible at that time, but I am pessimistic this would offer much in the way of functional benefit.

## 2021-04-20 NOTE — Progress Notes (Signed)
Subjective: Patient reports resting comfortably in bed. No acute events overnight. Continues to be in A fib although she is rate controlled after trauma service started her on amiodarone gtt last night.   Objective: Vital signs in last 24 hours: Temp:  [97.7 F (36.5 C)-99.3 F (37.4 C)] 99.3 F (37.4 C) (02/10 0400) Pulse Rate:  [91-144] 98 (02/10 0300) Resp:  [16-30] 25 (02/10 0300) BP: (107-168)/(54-115) 116/82 (02/10 0300) SpO2:  [91 %-100 %] 92 % (02/10 0300) Weight:  [61.2 kg] 61.2 kg (02/09 1552)  Intake/Output from previous day: 02/09 0701 - 02/10 0700 In: 1870.3 [I.V.:715.1; IV Piggyback:1155.3] Out: 510 [Urine:450; Emesis/NG output:60] Intake/Output this shift: Total I/O In: 624.1 [I.V.:624.1] Out: -   Physical Exam: The patient is not alert, nonverbal, and unable to follow commands. She responded to voice this morning with eye opening and attempts to maintain eye contact and track my presence throughout the room. Eye opening not sustained. PERRLA. She has purposeful movement in her BUE and BLE. GCS 9. Right occipital hematoma.   Lab Results: Recent Labs    04/24/2021 1420 04/15/2021 1431  WBC 6.4  --   HGB 14.2 15.0  HCT 41.9 44.0  PLT 103*  --    BMET Recent Labs    04/17/2021 1420 04/30/2021 1431  NA 138 140  K 3.6 3.7  CL 106 106  CO2 23  --   GLUCOSE 122* 118*  BUN 15 16  CREATININE 0.90 0.80  CALCIUM 8.8*  --     Studies/Results: CT HEAD WO CONTRAST  Result Date: 04/20/2021 CLINICAL DATA:  Follow-up intracranial hemorrhage EXAM: CT HEAD WITHOUT CONTRAST TECHNIQUE: Contiguous axial images were obtained from the base of the skull through the vertex without intravenous contrast. RADIATION DOSE REDUCTION: This exam was performed according to the departmental dose-optimization program which includes automated exposure control, adjustment of the mA and/or kV according to patient size and/or use of iterative reconstruction technique. COMPARISON:  Yesterday  FINDINGS: Brain: Subdural hematoma along the left cerebral convexity with high-density component measuring up to 12 mm thickness, increased in extent and thickness from before. Multifocal subarachnoid hemorrhage centered on the frontal lobes, asymmetric to the left where there is also blooming parenchymal contusion causing ill-defined low-density and gyral thickening. Trace right-sided subdural hemorrhage along the posterior falx. Rightward midline shift has increased to 7 mm. No entrapment or complicating infarct. Vascular: No hyperdense vessel or unexpected calcification. Skull: Scalp swelling on the right posteriorly neighboring a linear occipital bone fracture which is nondepressed. Sinuses/Orbits: No acute finding These results were called by telephone at the time of interpretation on 04/20/2021 at 5:02 am to provider NP Glenford Peers, who verbally acknowledged these results. IMPRESSION: Increased subdural hemorrhage especially along the left cerebral convexity with thickness measures up to 12 mm. This and blooming frontal contusion cause midline shift of 7 mm. Electronically Signed   By: Jorje Guild M.D.   On: 04/20/2021 05:13   CT HEAD WO CONTRAST  Result Date: 04/12/2021 CLINICAL DATA:  Head trauma, abnormal mental status (Age 31-64y); Neck trauma (Age >= 65y) EXAM: CT HEAD WITHOUT CONTRAST CT CERVICAL SPINE WITHOUT CONTRAST TECHNIQUE: Multidetector CT imaging of the head and cervical spine was performed following the standard protocol without intravenous contrast. Multiplanar CT image reconstructions of the cervical spine were also generated. RADIATION DOSE REDUCTION: This exam was performed according to the departmental dose-optimization program which includes automated exposure control, adjustment of the mA and/or kV according to patient size and/or use of iterative reconstruction  technique. COMPARISON:  CT 12/16/2020. FINDINGS: CT HEAD FINDINGS Brain: There is large volume, acute subarachnoid hemorrhage  along the left frontotemporal region and to a lesser degree the anterior right frontal lobe. There is an acute, holohemispheric, left-sided subdural hematoma measuring up to 8 mm in thickness (series 5, image 35). There is no significant midline shift. The ventricles are normal in size.Scattered subcortical and periventricular white matter hypodensities, nonspecific but likely sequela of chronic small vessel ischemic disease.Mild cerebral atrophy Vascular: No hyperdense vessel or unexpected calcification. Skull: There is a fracture of the right paramedian aspect of the occipital bone. Sinuses/Orbits: No acute finding. Other: There is a large posterior scalp hematoma. CT CERVICAL SPINE FINDINGS Alignment: Grade 1 anterolisthesis at C6-C7. Trace anterolisthesis at T1-T2. Skull base and vertebrae: There is a nondisplaced skull fracture of the right paramedian occipital bone extending along the base of the skull. There is motion artifact distorting the skull base but there is no evidence of extension into the occipital condyles or colitis Soft tissues and spinal canal: No prevertebral fluid or swelling. No visible canal hematoma. Disc levels: There is multilevel degenerative disc disease, severe at C7-T1. There is multilevel facet arthropathy, moderate-severe. Fused facets at C7-T1. Upper chest: There is interlobular septal thickening and layering pleural effusion seen in the lung apices. Mild paraseptal emphysema. Other: None IMPRESSION: Acute left frontotemporal subarachnoid hemorrhage and to a lesser degree along the right frontal lobe. Acute, holohemispheric left-sided subdural hematoma measuring up to 8 mm in thickness. No significant midline shift. Large posterior scalp hematoma with underlying skull fracture of the right paramedian aspect of the occipital bone. No acute cervical spine fracture. Multilevel degenerative disc disease and facet arthropathy. Pulmonary edema and layering pleural effusions seen in the  lung apices. These CRITICAL results were called by discussed via telephone by Dr. Macy Mis 04/17/2021 with provider Marda Stalker , who verbally acknowledged these results. Electronically Signed   By: Maurine Simmering M.D.   On: 04/15/2021 15:04   CT CERVICAL SPINE WO CONTRAST  Result Date: 04/13/2021 CLINICAL DATA:  Head trauma, abnormal mental status (Age 68-64y); Neck trauma (Age >= 65y) EXAM: CT HEAD WITHOUT CONTRAST CT CERVICAL SPINE WITHOUT CONTRAST TECHNIQUE: Multidetector CT imaging of the head and cervical spine was performed following the standard protocol without intravenous contrast. Multiplanar CT image reconstructions of the cervical spine were also generated. RADIATION DOSE REDUCTION: This exam was performed according to the departmental dose-optimization program which includes automated exposure control, adjustment of the mA and/or kV according to patient size and/or use of iterative reconstruction technique. COMPARISON:  CT 12/16/2020. FINDINGS: CT HEAD FINDINGS Brain: There is large volume, acute subarachnoid hemorrhage along the left frontotemporal region and to a lesser degree the anterior right frontal lobe. There is an acute, holohemispheric, left-sided subdural hematoma measuring up to 8 mm in thickness (series 5, image 35). There is no significant midline shift. The ventricles are normal in size.Scattered subcortical and periventricular white matter hypodensities, nonspecific but likely sequela of chronic small vessel ischemic disease.Mild cerebral atrophy Vascular: No hyperdense vessel or unexpected calcification. Skull: There is a fracture of the right paramedian aspect of the occipital bone. Sinuses/Orbits: No acute finding. Other: There is a large posterior scalp hematoma. CT CERVICAL SPINE FINDINGS Alignment: Grade 1 anterolisthesis at C6-C7. Trace anterolisthesis at T1-T2. Skull base and vertebrae: There is a nondisplaced skull fracture of the right paramedian occipital bone  extending along the base of the skull. There is motion artifact distorting the skull base but  there is no evidence of extension into the occipital condyles or colitis Soft tissues and spinal canal: No prevertebral fluid or swelling. No visible canal hematoma. Disc levels: There is multilevel degenerative disc disease, severe at C7-T1. There is multilevel facet arthropathy, moderate-severe. Fused facets at C7-T1. Upper chest: There is interlobular septal thickening and layering pleural effusion seen in the lung apices. Mild paraseptal emphysema. Other: None IMPRESSION: Acute left frontotemporal subarachnoid hemorrhage and to a lesser degree along the right frontal lobe. Acute, holohemispheric left-sided subdural hematoma measuring up to 8 mm in thickness. No significant midline shift. Large posterior scalp hematoma with underlying skull fracture of the right paramedian aspect of the occipital bone. No acute cervical spine fracture. Multilevel degenerative disc disease and facet arthropathy. Pulmonary edema and layering pleural effusions seen in the lung apices. These CRITICAL results were called by discussed via telephone by Dr. Macy Mis 04/29/2021 with provider Marda Stalker , who verbally acknowledged these results. Electronically Signed   By: Maurine Simmering M.D.   On: 04/29/2021 15:04   DG Chest Portable 1 View  Result Date: 04/27/2021 CLINICAL DATA:  Trauma. EXAM: PORTABLE CHEST 1 VIEW COMPARISON:  Jul 12, 2013. FINDINGS: The heart size and mediastinal contours are within normal limits. Minimal bibasilar subsegmental atelectasis or scarring is noted. Status post bilateral shoulder arthroplasties. IMPRESSION: Minimal bibasilar subsegmental atelectasis or scarring. Electronically Signed   By: Marijo Conception M.D.   On: 04/23/2021 15:13    Assessment/Plan: 88 y.o. female who presented to the ED following a fall resulting in head trauma with LOC. CT cervical spine unremarkable for acute findings. CT head  with acute large left frontotemporal SAH, small right frontal lobe SAH, left-sided SDH measuring up to 8 mm, and large posterior scalp hematoma with underlying occipital fracture. Eliquis was reversed per pharmacy. Her neurological exam has remained stable overnight and slightly improved. Follow up CT head this morning revealed increased SDH especially along the left cerebral convexity with thickness measures up to 12 mm. MLS of 7 mm. No acute neurosurgical intervention is indicated at this time. Recommend continued medical management.    -Keppra 500 mg BID X 7 days -Neuro ICU -Frequent neuro checks -Maintain SBP < 160   LOS: 1 day     Marvis Moeller, DNP, NP-C 04/20/2021, 6:34 AM

## 2021-04-20 NOTE — Progress Notes (Signed)
Initial Nutrition Assessment  DOCUMENTATION CODES:   Severe malnutrition in context of social or environmental circumstances  INTERVENTION:   Initiate tube feeding via cortrak tube (after xray confirmation): Vital 1.5 at 25 ml/h and increase by 10 ml every 8 hours to goal rate of 45 ml/hr (1080 ml per day) Prosource TF 45 ml TID  Provides 1740 kcal, 105 gm protein, 820 ml free water daily   NUTRITION DIAGNOSIS:   Severe Malnutrition related to social / environmental circumstances (dementia) as evidenced by severe muscle depletion, severe fat depletion.  GOAL:   Patient will meet greater than or equal to 90% of their needs  MONITOR:   TF tolerance, Labs  REASON FOR ASSESSMENT:   Consult Enteral/tube feeding initiation and management  ASSESSMENT:   Pt with PMH of dementia, CAD, PVD s/p R carotid endarterectomy, A flutter on Eliquis, asthma, hypothyroidism, HTN, HLD, and hx of L gastric restriction surgery in 2006 (per chart review appears to be a L colectomy in 2005 with colostomy with reversal 2006), admitted from home after unwitnessed fall with TBI/SAH/SDH with 7 mm shift, occipital skull fx with scalp hematoma.   Pt discussed during ICU rounds and with RN. Spoke with pt's daughter and brother-in-law. They report that pt weighed 175 lb a year ago, then lost a lot of weight very quickly. Daughter feels that this is related to her dementia. Pt lives at home with her husband but has not been eating. Per report she is eating 1/3 of the meal offered to her and family has tried to get her to drink protein drinks.   Pt unable to participate in swallow eval, plan for cortrak today.   Medications reviewed and include: synthroid, protonix, KCl x 1  NS with 20 mEq KCl/L @ 75 ml/hr Amiodarone Keppra  Labs reviewed    NUTRITION - FOCUSED PHYSICAL EXAM:  Flowsheet Row Most Recent Value  Orbital Region Severe depletion  Upper Arm Region Moderate depletion  Thoracic and Lumbar  Region Severe depletion  Buccal Region Moderate depletion  Temple Region Severe depletion  Clavicle Bone Region Severe depletion  Clavicle and Acromion Bone Region Severe depletion  Scapular Bone Region Severe depletion  Dorsal Hand Severe depletion  Patellar Region Moderate depletion  Anterior Thigh Region Moderate depletion  Posterior Calf Region Moderate depletion  Edema (RD Assessment) None  Hair Reviewed  Eyes Reviewed  [pale]  Mouth Reviewed  [edenulous, daughter has dentures]  Skin Reviewed  Nails Reviewed       Diet Order:   Diet Order             Diet NPO time specified  Diet effective now                   EDUCATION NEEDS:   Not appropriate for education at this time  Skin:  Skin Assessment: Reviewed RN Assessment (head laceration)  Last BM:  2/9  Height:   Ht Readings from Last 1 Encounters:  04/29/2021 5\' 6"  (1.676 m)    Weight:   Wt Readings from Last 1 Encounters:  04/13/2021 61.2 kg    BMI:  Body mass index is 21.79 kg/m.  Estimated Nutritional Needs:   Kcal:  1600-1800  Protein:  95-110 grams  Fluid:  > 1.6 L/day  Lockie Pares., RD, LDN, CNSC See [facility] for contact information

## 2021-04-20 NOTE — Progress Notes (Signed)
Pt bladder scanned for 415 cc urine. I & O cath performed and 460 cc urine obtained.

## 2021-04-21 ENCOUNTER — Inpatient Hospital Stay (HOSPITAL_COMMUNITY): Payer: Medicare Other

## 2021-04-21 DIAGNOSIS — Z7189 Other specified counseling: Secondary | ICD-10-CM

## 2021-04-21 DIAGNOSIS — Z515 Encounter for palliative care: Secondary | ICD-10-CM | POA: Diagnosis not present

## 2021-04-21 LAB — GLUCOSE, CAPILLARY
Glucose-Capillary: 129 mg/dL — ABNORMAL HIGH (ref 70–99)
Glucose-Capillary: 129 mg/dL — ABNORMAL HIGH (ref 70–99)
Glucose-Capillary: 115 mg/dL — ABNORMAL HIGH (ref 70–99)
Glucose-Capillary: 101 mg/dL — ABNORMAL HIGH (ref 70–99)

## 2021-04-21 LAB — BASIC METABOLIC PANEL
Anion gap: 9 (ref 5–15)
BUN: 33 mg/dL — ABNORMAL HIGH (ref 8–23)
CO2: 16 mmol/L — ABNORMAL LOW (ref 22–32)
Calcium: 8.2 mg/dL — ABNORMAL LOW (ref 8.9–10.3)
Chloride: 113 mmol/L — ABNORMAL HIGH (ref 98–111)
Creatinine, Ser: 1.13 mg/dL — ABNORMAL HIGH (ref 0.44–1.00)
GFR, Estimated: 47 mL/min — ABNORMAL LOW (ref >60)
Glucose, Bld: 131 mg/dL — ABNORMAL HIGH (ref 70–99)
Potassium: 4.8 mmol/L (ref 3.5–5.1)
Sodium: 138 mmol/L (ref 135–145)

## 2021-04-21 LAB — PHOSPHORUS
Phosphorus: 2.9 mg/dL (ref 2.5–4.6)
Phosphorus: 2.6 mg/dL (ref 2.5–4.6)

## 2021-04-21 LAB — MAGNESIUM
Magnesium: 1.7 mg/dL (ref 1.7–2.4)
Magnesium: 1.8 mg/dL (ref 1.7–2.4)

## 2021-04-21 MED ORDER — POLYVINYL ALCOHOL 1.4 % OP SOLN
1.0000 [drp] | Freq: Four times a day (QID) | OPHTHALMIC | Status: DC | PRN
  Filled 2021-04-21: qty 15

## 2021-04-21 MED ORDER — BIOTENE DRY MOUTH MT LIQD: 15.0000 mL | OROMUCOSAL | Status: DC | PRN

## 2021-04-21 MED ORDER — GLYCOPYRROLATE 0.2 MG/ML IJ SOLN
0.4000 mg | INTRAMUSCULAR | Status: DC
  Administered 2021-04-21 – 2021-04-22 (×6): 0.4 mg via INTRAVENOUS
  Filled 2021-04-21 (×8): qty 2

## 2021-04-21 MED ORDER — GLYCOPYRROLATE 0.2 MG/ML IJ SOLN: 0.2000 mg | INTRAMUSCULAR | Status: DC | PRN

## 2021-04-21 MED ORDER — HYDROMORPHONE HCL 1 MG/ML IJ SOLN
0.5000 mg | INTRAMUSCULAR | Status: DC | PRN
  Administered 2021-04-22: 0.5 mg via INTRAVENOUS
  Administered 2021-04-22: 1 mg via INTRAVENOUS
  Filled 2021-04-21 (×2): qty 1

## 2021-04-21 MED ORDER — GLYCOPYRROLATE 1 MG PO TABS: 1.0000 mg | ORAL_TABLET | ORAL | Status: DC | PRN

## 2021-04-21 MED ORDER — ONDANSETRON HCL 4 MG/2ML IJ SOLN: 4.0000 mg | Freq: Four times a day (QID) | INTRAMUSCULAR | Status: DC | PRN

## 2021-04-21 MED ORDER — ACETAMINOPHEN 325 MG PO TABS: 650.0000 mg | ORAL_TABLET | Freq: Four times a day (QID) | ORAL | Status: DC | PRN

## 2021-04-21 MED ORDER — GLYCOPYRROLATE 0.2 MG/ML IJ SOLN
0.4000 mg | INTRAMUSCULAR | Status: DC | PRN
  Administered 2021-04-21 – 2021-04-22 (×2): 0.4 mg via INTRAVENOUS
  Filled 2021-04-21: qty 2

## 2021-04-21 MED ORDER — HALOPERIDOL LACTATE 5 MG/ML IJ SOLN: 0.5000 mg | INTRAMUSCULAR | Status: DC | PRN

## 2021-04-21 MED ORDER — ONDANSETRON 4 MG PO TBDP: 4.0000 mg | ORAL_TABLET | Freq: Four times a day (QID) | ORAL | Status: DC | PRN

## 2021-04-21 MED ORDER — ACETAMINOPHEN 650 MG RE SUPP: 650.0000 mg | Freq: Four times a day (QID) | RECTAL | Status: DC | PRN

## 2021-04-21 MED ORDER — LORAZEPAM 2 MG/ML IJ SOLN
0.5000 mg | INTRAMUSCULAR | Status: DC | PRN
  Administered 2021-04-22: 1 mg via INTRAVENOUS
  Administered 2021-04-22: 0.5 mg via INTRAVENOUS
  Filled 2021-04-21 (×2): qty 1

## 2021-04-21 MED ORDER — HALOPERIDOL LACTATE 2 MG/ML PO CONC: 0.5000 mg | ORAL | Status: DC | PRN

## 2021-04-21 MED ORDER — HALOPERIDOL 0.5 MG PO TABS: 0.5000 mg | ORAL_TABLET | ORAL | Status: DC | PRN

## 2021-04-21 NOTE — Consult Note (Addendum)
Palliative Medicine Inpatient Consult Note  Consulting Provider: Vallarie Mare, MD  Reason for consult:   [facility] Consult  Reason for Consult? severe neurologic deficits   HPI:  Per intake H&P --> Danielle [name] is a 88 y.o. female with a hx of A. Flutter s/p cardioversion on Eliquis (last dose this morning), carotid artery disease/stenosis w/ hx of prior R endarterectomy, HTN, HLD, asthma, hypothyroidism and dementia per family who presented to the ED via EMS as a level 2 trauma after an unwitnessed fall with a left cerebral contusions, subdural hematoma, subarachnoid hemorrhage.  Palliative care has been asked to get involved in the setting of significant subdural bleeding to further address goals of care.  Clinical Assessment/Goals of Care:  *Please note that this is a verbal dictation therefore any spelling or grammatical errors are due to the "[employer] [employer]" system interpretation.  I have reviewed medical records including EPIC notes, labs and imaging, received report from bedside RN, assessed the patient.    I called patients daughter, Letta Moynahan to further discuss diagnosis prognosis, GOC, EOL wishes, disposition and options.   I introduced Palliative Medicine as specialized medical care for people living with serious illness. It focuses on providing relief from the symptoms and stress of a serious illness. The goal is to improve quality of life for both the patient and the family.  Medical History Review and Understanding:  Reviewed Sharian's past medical history inclusive of her coronary artery disease, a flutter, hypothyroidism, and most significantly dementia.  Discussed that Danielle [name] is suffer from dementia for the last 2 years though within the last 6 months it has become more pronounced.  In the last 6 weeks it seems like Danielle [name] has declined more significantly.  Social History:  Danielle [name] is from [place],  Danielle [name].  She is 1 of 5 children.  She had been married to her first husband for 5 years and has been married to her second husband for over 59 years.  She shares 2 daughters from her first husband and has a stepson from her second husband.  She has 4 grandchildren and 3 great-grandchildren.  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  She has a strong love of music and has a strong International aid/development worker.  She enjoyed partaking in choir activities.  Functional and Nutritional State:  Imagene required more help in the last 6 weeks and her daughter, Danielle [name] has been offering that by coming over frequently.  She had also been preparing evening meals for both Clovia and her husband Danielle [name].  Destanee's appetite had remained fair in the home.  Advance Directives: A detailed discussion was had today regarding advanced directives.  Danielle [name] does think that Danielle [name] has these and will look for them prior to coming in later this afternoon.  Code Status: Concepts specific to code status, artifical feeding and hydration, continued IV antibiotics and rehospitalization was had.  The difference between a aggressive medical intervention path  and a palliative comfort care path for this patient at this time was had.   Encouraged patient/family to consider DNR/DNI status understanding evidenced based poor outcomes in similar hospitalized patient, as the cause of arrest is likely associated with advanced chronic/terminal illness rather than an easily reversible acute cardio-pulmonary event. I explained that DNR/DNI does not change the medical plan and it only comes into effect after a person has arrested (died).  It is a protective measure to keep Danielle [name]  from harming the patient in their last moments of life. Danielle [name] was agreeable to DNR/DNI with understanding that patient would not receive CPR, defibrillation, ACLS medications, or intubation.   Mellissa Kohut had a conversation with her family members.   Annetta Maw, and Danielle [name] all agree that [name] would not want to be on life supportive measures.   Discussion:  Danielle [name] and I discussed Danielle [name]'s clinical condition and how she has a very poor overall clinical prognosis.  Discussed the importance of making decisions which will be either to pursue the present labs at the level of medical care or be geared more towards comfort.  Danielle [name] completed a life review and shared the type of woman Danielle [name] was again indicating what a wonderful mother she is in strong person.  She was very tearful during this time whereby I offered emotional support through therapeutic silence.  I shared that if at all possible it might be useful to make a decision sooner than later if our goals are for comfort so that Danielle [name] does not [place] any undue suffering.  Danielle [name] and her stepfather, Danielle [name] plan to be here at 430 this evening for further conversation.  Discussed the importance of continued conversation with family and their  medical providers regarding overall plan of care and treatment options, ensuring decisions are within the context of the patients values and GOCs. ___________________________________  Decision Maker: Danielle [name] (spouse) [phone]  SUMMARY OF RECOMMENDATIONS   DNAR/DNI  Plan to meet with patient's family at 75 this evening  Have discussed continuing the current path versus a more comfort mediated path  Ongoing palliative care support  Code Status/Advance Care Planning: DNAR/DNI  Palliative Prophylaxis:  Aspiration, Bowel Regimen, Delirium Protocol, Frequent Pain Assessment, Oral Care, Palliative Wound Care, and Turn Reposition  Additional Recommendations (Limitations, Scope, Preferences): Treat what is treatable  Psycho-social/Spiritual:  Desire for further Chaplaincy support: Yes Additional Recommendations: Education on subdural bleeding   Prognosis: Very poor overall in the setting of acute left cerebral contusions, subdural  hematoma, subarachnoid hemorrhage with an underlying history of dementia.  Prior to this event already had a decrease in functional state.  Discharge Planning: Discharge plan uncertain.  Vitals:   04/21/21 1300 04/21/21 1400  BP: (!) 143/88 138/86  Pulse: (!) 111 (!) 110  Resp: 20 (!) 24  Temp:    SpO2: 98% 96%    Intake/Output Summary (Last 24 hours) at 04/21/2021 1437 Last data filed at 04/21/2021 1000 Gross per 24 hour  Intake 1748.53 ml  Output 461 ml  Net 1287.53 ml   Last Weight  Most recent update: 04/21/2021  5:10 AM    Weight  65.8 kg (145 lb 1 oz)            Gen: Elderly frail female HEENT: dry mucous membranes, (+) coretrack CV: Irregular rate and rhythm PULM: On 4 L nasal cannula ABD: soft/nontender EXT: No edema Neuro: Opens eyes does not follow commands  PPS: 10%   This conversation/these recommendations were discussed with patient primary care team, Dr. Arnoldo Morale  Total Time: 38  MDM High  Medical Decision Making:4 #/Complex Problems:4                      Data Reviewed:4                 Management:4 (1-Straightforward, 2-Low, 3-Moderate, 4-High) ______________________________________________________ [facility] Team Team Cell Phone: [phone] Please utilize secure chat with additional questions, if there is no  response within 30 minutes please call the above phone number  Palliative Medicine Team providers are available by phone from 7am to 7pm daily and can be reached through the team cell phone.  Should this patient require assistance outside of these hours, please call the patient's attending physician.

## 2021-04-21 NOTE — Progress Notes (Signed)
Subjective: The patient is somnolent but arousable.  She is in no apparent distress.  Her husband and daughter are at the bedside.  Objective: Vital signs in last 24 hours: Temp:  [97.8 F (36.6 C)-98.6 F (37 C)] 97.8 F (36.6 C) (02/11 0800) Pulse Rate:  [60-111] 109 (02/11 0900) Resp:  [18-37] 21 (02/11 0900) BP: (97-150)/(57-99) 138/88 (02/11 0900) SpO2:  [92 %-99 %] 97 % (02/11 0900) Weight:  [65.8 kg] 65.8 kg (02/11 0500) Estimated body mass index is 23.41 kg/m as calculated from the following:   Height as of this encounter: 5\' 6"  (1.676 m).   Weight as of this encounter: 65.8 kg.   Intake/Output from previous day: 02/10 0701 - 02/11 0700 In: 2370.6 [I.V.:2091.6; NG/GT:30; IV Piggyback:249] Out: 461 [Urine:461] Intake/Output this shift: No intake/output data recorded.  Physical exam the patient is right hemiplegic and aphasic.  I have reviewed the patient's follow-up head CT performed today.  She continues to have a left-sided subdural hematoma with subarachnoid hemorrhage, frontal and temporal contusions.  There is approximately 6 mm midline shift without change.  Lab Results: Recent Labs    04/18/2021 1420 04/11/2021 1431 04/20/21 1011  WBC 6.4  --  10.8*  HGB 14.2 15.0 14.0  HCT 41.9 44.0 41.9  PLT 103*  --  PLATELET CLUMPS NOTED ON SMEAR, UNABLE TO ESTIMATE   BMET Recent Labs    04/13/2021 1420 05/05/2021 1431 04/20/21 1011  NA 138 140 138  K 3.6 3.7 4.7  CL 106 106 106  CO2 23  --  20*  GLUCOSE 122* 118* 133*  BUN 15 16 20   CREATININE 0.90 0.80 1.06*  CALCIUM 8.8*  --  8.6*    Studies/Results: CT HEAD WO CONTRAST (5MM)  Result Date: 04/21/2021 CLINICAL DATA:  Follow-up subdural hematoma. Neuro surgery notes from yesterday evening reviewed. EXAM: CT HEAD WITHOUT CONTRAST TECHNIQUE: Contiguous axial images were obtained from the base of the skull through the vertex without intravenous contrast. RADIATION DOSE REDUCTION: This exam was performed according  to the departmental dose-optimization program which includes automated exposure control, adjustment of the mA and/or kV according to patient size and/or use of iterative reconstruction technique. COMPARISON:  Head CT from yesterday FINDINGS: Brain: Essentially holo hemispheric subdural hematoma on the left measuring up to 12-13 mm in thickness laterally. There has been some stratification of hematocrit level since prior. Thickness of tentorial and falcine subdural hemorrhage has increased, but only measuring up to 3 mm in thickness. Diffuse subarachnoid hemorrhage along the left more than right cerebral convexities where there is bilateral frontal and left anterior temporal edema from contusion. Midline shift is unchanged at 6-7 mm. No complicating infarct. Vascular: No acute finding Skull: Nondepressed occipital bone fracture, known. Sinuses/Orbits: Negative IMPRESSION: 1. Mild increase in tentorial and falcine subdural hematoma, but only measuring up to 3 mm in thickness. 2. Lateral left subdural hemorrhage is similar in size and midline shift continues to measure 6-7 mm. 3. No change in frontotemporal contusions with overlying subarachnoid blood. Electronically Signed   By: Jorje Guild M.D.   On: 04/21/2021 06:08   CT HEAD WO CONTRAST  Result Date: 04/20/2021 CLINICAL DATA:  Follow-up intracranial hemorrhage EXAM: CT HEAD WITHOUT CONTRAST TECHNIQUE: Contiguous axial images were obtained from the base of the skull through the vertex without intravenous contrast. RADIATION DOSE REDUCTION: This exam was performed according to the departmental dose-optimization program which includes automated exposure control, adjustment of the mA and/or kV according to patient size and/or use  of iterative reconstruction technique. COMPARISON:  Yesterday FINDINGS: Brain: Subdural hematoma along the left cerebral convexity with high-density component measuring up to 12 mm thickness, increased in extent and thickness from  before. Multifocal subarachnoid hemorrhage centered on the frontal lobes, asymmetric to the left where there is also blooming parenchymal contusion causing ill-defined low-density and gyral thickening. Trace right-sided subdural hemorrhage along the posterior falx. Rightward midline shift has increased to 7 mm. No entrapment or complicating infarct. Vascular: No hyperdense vessel or unexpected calcification. Skull: Scalp swelling on the right posteriorly neighboring a linear occipital bone fracture which is nondepressed. Sinuses/Orbits: No acute finding These results were called by telephone at the time of interpretation on 04/20/2021 at 5:02 am to provider NP Glenford Peers, who verbally acknowledged these results. IMPRESSION: Increased subdural hemorrhage especially along the left cerebral convexity with thickness measures up to 12 mm. This and blooming frontal contusion cause midline shift of 7 mm. Electronically Signed   By: Jorje Guild M.D.   On: 04/20/2021 05:13   CT HEAD WO CONTRAST  Result Date: 05/06/2021 CLINICAL DATA:  Head trauma, abnormal mental status (Age 24-64y); Neck trauma (Age >= 65y) EXAM: CT HEAD WITHOUT CONTRAST CT CERVICAL SPINE WITHOUT CONTRAST TECHNIQUE: Multidetector CT imaging of the head and cervical spine was performed following the standard protocol without intravenous contrast. Multiplanar CT image reconstructions of the cervical spine were also generated. RADIATION DOSE REDUCTION: This exam was performed according to the departmental dose-optimization program which includes automated exposure control, adjustment of the mA and/or kV according to patient size and/or use of iterative reconstruction technique. COMPARISON:  CT 12/16/2020. FINDINGS: CT HEAD FINDINGS Brain: There is large volume, acute subarachnoid hemorrhage along the left frontotemporal region and to a lesser degree the anterior right frontal lobe. There is an acute, holohemispheric, left-sided subdural hematoma measuring  up to 8 mm in thickness (series 5, image 35). There is no significant midline shift. The ventricles are normal in size.Scattered subcortical and periventricular white matter hypodensities, nonspecific but likely sequela of chronic small vessel ischemic disease.Mild cerebral atrophy Vascular: No hyperdense vessel or unexpected calcification. Skull: There is a fracture of the right paramedian aspect of the occipital bone. Sinuses/Orbits: No acute finding. Other: There is a large posterior scalp hematoma. CT CERVICAL SPINE FINDINGS Alignment: Grade 1 anterolisthesis at C6-C7. Trace anterolisthesis at T1-T2. Skull base and vertebrae: There is a nondisplaced skull fracture of the right paramedian occipital bone extending along the base of the skull. There is motion artifact distorting the skull base but there is no evidence of extension into the occipital condyles or colitis Soft tissues and spinal canal: No prevertebral fluid or swelling. No visible canal hematoma. Disc levels: There is multilevel degenerative disc disease, severe at C7-T1. There is multilevel facet arthropathy, moderate-severe. Fused facets at C7-T1. Upper chest: There is interlobular septal thickening and layering pleural effusion seen in the lung apices. Mild paraseptal emphysema. Other: None IMPRESSION: Acute left frontotemporal subarachnoid hemorrhage and to a lesser degree along the right frontal lobe. Acute, holohemispheric left-sided subdural hematoma measuring up to 8 mm in thickness. No significant midline shift. Large posterior scalp hematoma with underlying skull fracture of the right paramedian aspect of the occipital bone. No acute cervical spine fracture. Multilevel degenerative disc disease and facet arthropathy. Pulmonary edema and layering pleural effusions seen in the lung apices. These CRITICAL results were called by discussed via telephone by Dr. Macy Mis 04/20/2021 with provider Marda Stalker , who verbally acknowledged  these results. Electronically Signed  By: Maurine Simmering M.D.   On: 05/07/2021 15:04   CT CERVICAL SPINE WO CONTRAST  Result Date: 04/18/2021 CLINICAL DATA:  Head trauma, abnormal mental status (Age 58-64y); Neck trauma (Age >= 65y) EXAM: CT HEAD WITHOUT CONTRAST CT CERVICAL SPINE WITHOUT CONTRAST TECHNIQUE: Multidetector CT imaging of the head and cervical spine was performed following the standard protocol without intravenous contrast. Multiplanar CT image reconstructions of the cervical spine were also generated. RADIATION DOSE REDUCTION: This exam was performed according to the departmental dose-optimization program which includes automated exposure control, adjustment of the mA and/or kV according to patient size and/or use of iterative reconstruction technique. COMPARISON:  CT 12/16/2020. FINDINGS: CT HEAD FINDINGS Brain: There is large volume, acute subarachnoid hemorrhage along the left frontotemporal region and to a lesser degree the anterior right frontal lobe. There is an acute, holohemispheric, left-sided subdural hematoma measuring up to 8 mm in thickness (series 5, image 35). There is no significant midline shift. The ventricles are normal in size.Scattered subcortical and periventricular white matter hypodensities, nonspecific but likely sequela of chronic small vessel ischemic disease.Mild cerebral atrophy Vascular: No hyperdense vessel or unexpected calcification. Skull: There is a fracture of the right paramedian aspect of the occipital bone. Sinuses/Orbits: No acute finding. Other: There is a large posterior scalp hematoma. CT CERVICAL SPINE FINDINGS Alignment: Grade 1 anterolisthesis at C6-C7. Trace anterolisthesis at T1-T2. Skull base and vertebrae: There is a nondisplaced skull fracture of the right paramedian occipital bone extending along the base of the skull. There is motion artifact distorting the skull base but there is no evidence of extension into the occipital condyles or colitis Soft  tissues and spinal canal: No prevertebral fluid or swelling. No visible canal hematoma. Disc levels: There is multilevel degenerative disc disease, severe at C7-T1. There is multilevel facet arthropathy, moderate-severe. Fused facets at C7-T1. Upper chest: There is interlobular septal thickening and layering pleural effusion seen in the lung apices. Mild paraseptal emphysema. Other: None IMPRESSION: Acute left frontotemporal subarachnoid hemorrhage and to a lesser degree along the right frontal lobe. Acute, holohemispheric left-sided subdural hematoma measuring up to 8 mm in thickness. No significant midline shift. Large posterior scalp hematoma with underlying skull fracture of the right paramedian aspect of the occipital bone. No acute cervical spine fracture. Multilevel degenerative disc disease and facet arthropathy. Pulmonary edema and layering pleural effusions seen in the lung apices. These CRITICAL results were called by discussed via telephone by Dr. Macy Mis 04/24/2021 with provider Marda Stalker , who verbally acknowledged these results. Electronically Signed   By: Maurine Simmering M.D.   On: 05/03/2021 15:04   DG Chest Portable 1 View  Result Date: 04/17/2021 CLINICAL DATA:  Trauma. EXAM: PORTABLE CHEST 1 VIEW COMPARISON:  Jul 12, 2013. FINDINGS: The heart size and mediastinal contours are within normal limits. Minimal bibasilar subsegmental atelectasis or scarring is noted. Status post bilateral shoulder arthroplasties. IMPRESSION: Minimal bibasilar subsegmental atelectasis or scarring. Electronically Signed   By: Marijo Conception M.D.   On: 05/03/2021 15:13   DG Abd Portable 1V  Result Date: 04/20/2021 CLINICAL DATA:  Feeding tube placement. EXAM: PORTABLE ABDOMEN - 1 VIEW COMPARISON:  None. FINDINGS: The bowel gas pattern is normal. Distal tip of feeding tube is seen in expected position of distal stomach. No radio-opaque calculi or other significant radiographic abnormality are seen.  IMPRESSION: Distal tip of feeding tube seen in expected position of distal stomach. Electronically Signed   By: Bobbe Medico.D.  On: 04/20/2021 12:36    Assessment/Plan: Left cerebral contusions, subdural hematoma, subarachnoid hemorrhage: I have discussed the situation with the patient's husband and daughter.  At her age and the fact that she is aphasic and hemiplegic I do not think her chances of a meaningful recovery are good.  I agree with Dr. Grandville Silos, that comfort care is reasonable at this point.  We discussed her CODE STATUS.  The patient's husband and daughter tells me that she would not want to be placed on life support if there is no the chance for a meaningful recovery.  I have suggested we make her a DNR.  They are going to me know their decision.  LOS: 2 days     [name] 04/21/2021, 9:57 AM     Patient ID: Danielle [name], female   DOB: 20-Aug-1932, 88 y.o.   MRN: [id number]

## 2021-04-21 NOTE — Progress Notes (Signed)
SLP Cancellation Note  Patient Details Name: Danielle [name] MRN: [id number] DOB: 1932/06/10   Cancelled treatment:        Pt not responsive to therapist attempt to awaken. Will continue efforts and RN to secure chat if becomes appropriate.    Danielle [name] 04/21/2021, 9:34 AM  Danielle [name] Caroli.Ed Risk analyst [phone] Office [phone]

## 2021-04-21 NOTE — Progress Notes (Signed)
Patient ID: Danielle [name], female   DOB: 1932/12/21, 88 y.o.   MRN: [id number] Follow up - Trauma Critical Care   Patient Details:    Danielle [name] is an 88 y.o. female.  Lines/tubes : External Urinary Catheter (Active)  Collection Container Dedicated Suction Canister 04/20/2021 2000  Suction (Verified suction is between 40-80 mmHg) Yes 04/21/2021 1700  Securement Method Mesh underwear 05/01/2021 2000  Site Assessment Clean;Intact 04/15/2021 2000  Intervention Female External Urinary Catheter Replaced 05/01/2021 1700  Output (mL) 450 mL 04/17/2021 1800    Microbiology/Sepsis markers: Results for orders placed or performed during the hospital encounter of 05/04/2021  Resp Panel by RT-PCR (Flu A&B, Covid) Nasopharyngeal Swab     Status: None   Collection Time: 04/21/2021  2:10 PM   Specimen: Nasopharyngeal Swab; Nasopharyngeal(NP) swabs in vial transport medium  Result Value Ref Range Status   SARS Coronavirus 2 by RT PCR NEGATIVE NEGATIVE Final    Comment: (NOTE) SARS-CoV-2 target nucleic acids are NOT DETECTED.  The SARS-CoV-2 RNA is generally detectable in upper respiratory specimens during the acute phase of infection. The lowest concentration of SARS-CoV-2 viral copies this assay can detect is 138 copies/mL. A negative result does not preclude SARS-Cov-2 infection and should not be used as the sole basis for treatment or other patient management decisions. A negative result may occur with  improper specimen collection/handling, submission of specimen other than nasopharyngeal swab, presence of viral mutation(s) within the areas targeted by this assay, and inadequate number of viral copies(<138 copies/mL). A negative result must be combined with clinical observations, patient history, and epidemiological information. The expected result is Negative.  Fact Sheet for Patients:  EntrepreneurPulse.com.au  Fact Sheet for Healthcare Providers:   IncredibleEmployment.be  This test is no t yet approved or cleared by the [place] FDA and  has been authorized for detection and/or diagnosis of SARS-CoV-2 by FDA under an Emergency Use Authorization (EUA). This EUA will remain  in effect (meaning this test can be used) for the duration of the COVID-19 declaration under Section 564(b)(1) of the Act, 21 U.S.C.section 360bbb-3(b)(1), unless the authorization is terminated  or revoked sooner.       Influenza A by PCR NEGATIVE NEGATIVE Final   Influenza B by PCR NEGATIVE NEGATIVE Final    Comment: (NOTE) The Xpert Xpress SARS-CoV-2/FLU/RSV plus assay is intended as an aid in the diagnosis of influenza from Nasopharyngeal swab specimens and should not be used as a sole basis for treatment. Nasal washings and aspirates are unacceptable for Xpert Xpress SARS-CoV-2/FLU/RSV testing.  Fact Sheet for Patients: EntrepreneurPulse.com.au  Fact Sheet for Healthcare Providers: IncredibleEmployment.be  This test is not yet approved or cleared by the [place] FDA and has been authorized for detection and/or diagnosis of SARS-CoV-2 by FDA under an Emergency Use Authorization (EUA). This EUA will remain in effect (meaning this test can be used) for the duration of the COVID-19 declaration under Section 564(b)(1) of the Act, 21 U.S.C. section 360bbb-3(b)(1), unless the authorization is terminated or revoked.  Performed at [facility], [place] [address], [place], [place] [address]   MRSA Next Gen by PCR, Nasal     Status: None   Collection Time: 05/02/2021  4:58 PM   Specimen: Nasal Mucosa; Nasal Swab  Result Value Ref Range Status   MRSA by PCR Next Gen NOT DETECTED NOT DETECTED Final    Comment: (NOTE) The GeneXpert MRSA Assay (FDA approved for NASAL specimens only), is one component of  a comprehensive MRSA colonization surveillance program. It is not intended to diagnose  MRSA infection nor to guide or monitor treatment for MRSA infections. Test performance is not FDA approved in patients less than 1 years old. Performed at [facility], [place] [address], [place], [place] [address]     Anti-infectives:  Anti-infectives (From admission, onward)    None       Best Practice/Protocols:  VTE Prophylaxis: Mechanical .  Consults: Treatment Team:  Vallarie Mare, MD   Subjective:    Overnight Issues:   Objective:  Vital signs for last 24 hours: Temp:  [97.8 F (36.6 C)-98.6 F (37 C)] 97.8 F (36.6 C) (02/11 0800) Pulse Rate:  [60-111] 109 (02/11 0900) Resp:  [18-37] 21 (02/11 0900) BP: (97-150)/(57-99) 138/88 (02/11 0900) SpO2:  [92 %-99 %] 97 % (02/11 0900) Weight:  [65.8 kg] 65.8 kg (02/11 0500)  Hemodynamic parameters for last 24 hours:    Intake/Output from previous day: 02/10 0701 - 02/11 0700 In: 2370.6 [I.V.:2091.6; NG/GT:30; IV Piggyback:249] Out: 461 [Urine:461]  Intake/Output this shift: No intake/output data recorded.  Vent settings for last 24 hours:    Physical Exam:  General: no respiratory distress Neuro: opened eyes to stim, localizes LUE, not F/C HEENT/Neck: no JVD Resp: clear to auscultation bilaterally CVS: IRR and HR 70s GI: soft, nontender, BS WNL, no r/g Extremities: no edema  Results for orders placed or performed during the hospital encounter of 05/08/2021 (from the past 24 hour(s))  CBC     Status: Abnormal   Collection Time: 04/20/21 10:11 AM  Result Value Ref Range   WBC 10.8 (H) 4.0 - 10.5 K/uL   RBC 4.31 3.87 - 5.11 MIL/uL   Hemoglobin 14.0 12.0 - 15.0 g/dL   HCT 41.9 36.0 - 46.0 %   MCV 97.2 80.0 - 100.0 fL   MCH 32.5 26.0 - 34.0 pg   MCHC 33.4 30.0 - 36.0 g/dL   RDW 15.8 (H) 11.5 - 15.5 %   Platelets PLATELET CLUMPS NOTED ON SMEAR, UNABLE TO ESTIMATE 150 - 400 K/uL   nRBC 0.0 0.0 - 0.2 %  Basic metabolic panel     Status: Abnormal   Collection Time: 04/20/21 10:11 AM  Result  Value Ref Range   Sodium 138 135 - 145 mmol/L   Potassium 4.7 3.5 - 5.1 mmol/L   Chloride 106 98 - 111 mmol/L   CO2 20 (L) 22 - 32 mmol/L   Glucose, Bld 133 (H) 70 - 99 mg/dL   BUN 20 8 - 23 mg/dL   Creatinine, Ser 1.06 (H) 0.44 - 1.00 mg/dL   Calcium 8.6 (L) 8.9 - 10.3 mg/dL   GFR, Estimated 51 (L) >60 mL/min   Anion gap 12 5 - 15  Magnesium     Status: None   Collection Time: 04/20/21  6:51 PM  Result Value Ref Range   Magnesium 1.8 1.7 - 2.4 mg/dL  Phosphorus     Status: None   Collection Time: 04/20/21  6:51 PM  Result Value Ref Range   Phosphorus 4.1 2.5 - 4.6 mg/dL  Glucose, capillary     Status: Abnormal   Collection Time: 04/20/21  7:30 PM  Result Value Ref Range   Glucose-Capillary 126 (H) 70 - 99 mg/dL  Glucose, capillary     Status: Abnormal   Collection Time: 04/20/21 11:20 PM  Result Value Ref Range   Glucose-Capillary 126 (H) 70 - 99 mg/dL  Magnesium  Status: None   Collection Time: 04/21/21  3:15 AM  Result Value Ref Range   Magnesium 1.7 1.7 - 2.4 mg/dL  Phosphorus     Status: None   Collection Time: 04/21/21  3:15 AM  Result Value Ref Range   Phosphorus 2.9 2.5 - 4.6 mg/dL  Glucose, capillary     Status: Abnormal   Collection Time: 04/21/21  3:37 AM  Result Value Ref Range   Glucose-Capillary 129 (H) 70 - 99 mg/dL  Glucose, capillary     Status: Abnormal   Collection Time: 04/21/21  7:49 AM  Result Value Ref Range   Glucose-Capillary 129 (H) 70 - 99 mg/dL    Assessment & Plan: Present on Admission:  Subdural hematoma    LOS: 2 days   Additional comments:I reviewed the patient's new clinical lab test results. And F/U CT H Unwitnessed Fall TBI/SAH/SDH - Eliquis reversed with Andexxa. NSGY, Dr. Marcello Moores following. Unfortunately, surgery would offer little palliative benefit and palliative care is the most reasonable course due to her low baseline quality of life.  Discussed this with patient's daughter and husband this morning who agree a  palliative focus to her care would be better than putting her through attempts at curative treatments.  Occipital Skull fracture with scalp hematoma - Per NSGY, Dr. Marcello Moores Hx A. Flutter on Eliquis - reversed w/ Andexxa.  Cardiology consulted on admission Hx CAD Hx asthma Hx hypothyroidism - IV synthroid Hx HTN - lopressor scheduled Hx HLD Elevated LFTs - chronic appearing, ? Related to crestor FEN - NPO, IVFs, hold on tube feeds till palliative discussion VTE - SCDs, anticoagulation and chemical prophylaxis on hold due to TBI ID - none currently needed Foley - None currently. Monitor I/O.  If continues to retain urine, may benefit from a foley Dispo - ICU, TBI therapies, ongoing [place] discussion with family, anticipate transfer to palliative care  Critical Care Total Time*: [place], MD  Trauma & General Surgery Use [email or web address] to contact on call provider  04/21/2021  *Care during the described time interval was provided by me. I have reviewed this patient's available data, including medical history, events of note, physical examination and test results as part of my evaluation.

## 2021-04-21 NOTE — Progress Notes (Signed)
Trauma Event Note   TRN rounded on patient, assisted with in and out cath. Pt stable at this time, VS WDL. Family at bedside. Checked in with primary RN, no needs at this time.   Last imported Vital Signs BP 135/85    Pulse (!) 102    Temp 98.6 F (37 C) (Oral)    Resp (!) 21    Ht 5\' 6"  (1.676 m)    Wt 135 lb (61.2 kg)    SpO2 97%    BMI 21.79 kg/m   Trending CBC Recent Labs    05/03/2021 1420 04/24/2021 1431 04/20/21 1011  WBC 6.4  --  10.8*  HGB 14.2 15.0 14.0  HCT 41.9 44.0 41.9  PLT 103*  --  PLATELET CLUMPS NOTED ON SMEAR, UNABLE TO ESTIMATE    Trending Coag's No results for input(s): APTT, INR in the last 72 hours.  Trending BMET Recent Labs    05/07/2021 1420 04/15/2021 1431 04/20/21 1011  NA 138 140 138  K 3.6 3.7 4.7  CL 106 106 106  CO2 23  --  20*  BUN 15 16 20   CREATININE 0.90 0.80 1.06*  GLUCOSE 122* 118* 133*      Zev Blue O Vidur Knust  Trauma Response RN  Please call TRN at [phone] for further assistance.

## 2021-04-21 NOTE — Progress Notes (Addendum)
° °  Palliative Medicine Inpatient Follow Up Note  Myself and Danielle Groves, RN met with patients husband, Danielle [name] and daughter, Danielle [name] this evening. We reviewed patients present health state in terms of her  fall causing an acute left cerebral contusions, subdural hematoma, subarachnoid hemorrhage. Discussed her underlying dementia. Prior to this event already had a decrease in functional state for the past 2-3 months. Most notably to the point whereby her spouse was helping with bADLs.  Reviewed that Danielle [name] will not be the functional person she once was and her quality of life moving forward will be trivial. Patient family understood this from their conversations with the trauma surgeon and neurosurgeon.  Reviewed that at this point a path of comfort may be within Danielle [name]'s best interest. Comfort care was described in detail. We talked about transition to comfort measures in house and what that would entail inclusive of medications to control pain, dyspnea, agitation, nausea, itching, and hiccups.  We discussed stopping all uneccessary measures such as NGT feeding, cardiac monitoring, blood draws, needle sticks, and frequent vital signs.   Offered emotional support through therapeutic listening. We lamented on the beautiful person Danielle [name] is and the life she led. Discussed her pride and values as an individual.   Confirmed with family the desire for comfort care.   Reviewed if stable to consider transition to inpatient hospice. Family familiar with [employer].  Questions and concerns answered.  SUMMARY OF RECOMMENDATIONS   DNAR/DNI  Comfort care  Medication per MAR  TOC for [facility] transfer  Unrestricted visitation  Ongoing Palliative support  Prognosis  < 2 weeks  Time Spent: 65  MDM - 4  Medical Decision Making:4 #/Complex Problems: 4                     Data Reviewed:    4             Management: 4 (1-Straightforward, 2-Low, 3-Moderate,  4-High) ______________________________________________________________________________________ [place] Palliative Medicine Team Team Cell Phone: [phone] Please utilize secure chat with additional questions, if there is no response within 30 minutes please call the above phone number  Palliative Medicine Team providers are available by phone from 7am to 7pm daily and can be reached through the team cell phone.  Should this patient require assistance outside of these hours, please call the patient's attending physician.

## 2021-04-22 DIAGNOSIS — Z7189 Other specified counseling: Secondary | ICD-10-CM | POA: Diagnosis not present

## 2021-04-22 DIAGNOSIS — Z515 Encounter for palliative care: Secondary | ICD-10-CM | POA: Diagnosis not present

## 2021-04-22 MED ORDER — HYDROMORPHONE HCL 1 MG/ML IJ SOLN
1.0000 mg | INTRAMUSCULAR | Status: DC | PRN
  Administered 2021-04-22 (×3): 1 mg via INTRAVENOUS
  Filled 2021-04-22 (×3): qty 1

## 2021-04-22 MED ORDER — LORAZEPAM 2 MG/ML IJ SOLN
1.0000 mg | INTRAMUSCULAR | Status: DC | PRN
  Administered 2021-04-22 (×2): 1 mg via INTRAVENOUS
  Filled 2021-04-22 (×2): qty 1

## 2021-05-03 ENCOUNTER — Encounter (HOSPITAL_COMMUNITY): Payer: Medicare Other

## 2021-05-09 NOTE — Progress Notes (Signed)
Subjective: The patient is no apparent distress.  She appears comfortable.  Her daughter is at the bedside.  Objective: Vital signs in last 24 hours: Temp:  [97.5 F (36.4 C)-97.8 F (36.6 C)] 97.8 F (36.6 C) (02/12 0730) Pulse Rate:  [106-124] 124 (02/12 0730) Resp:  [18-37] 20 (02/12 0730) BP: (130-147)/(81-92) 147/90 (02/12 0730) SpO2:  [93 %-99 %] 97 % (02/12 0730) Estimated body mass index is 23.41 kg/m as calculated from the following:   Height as of this encounter: 5\' 6"  (1.676 m).   Weight as of this encounter: 65.8 kg.   Intake/Output from previous day: 02/11 0701 - 02/12 0700 In: 615.2 [I.V.:615.2] Out: 520 [Urine:520] Intake/Output this shift: No intake/output data recorded.  Physical exam the patient is right hemiplegic and aphasic without change.  Lab Results: Recent Labs    05/04/2021 1420 04/20/2021 1431 04/20/21 1011  WBC 6.4  --  10.8*  HGB 14.2 15.0 14.0  HCT 41.9 44.0 41.9  PLT 103*  --  PLATELET CLUMPS NOTED ON SMEAR, UNABLE TO ESTIMATE   BMET Recent Labs    04/20/21 1011 04/21/21 1025  NA 138 138  K 4.7 4.8  CL 106 113*  CO2 20* 16*  GLUCOSE 133* 131*  BUN 20 33*  CREATININE 1.06* 1.13*  CALCIUM 8.6* 8.2*    Studies/Results: CT HEAD WO CONTRAST (5MM)  Result Date: 04/21/2021 CLINICAL DATA:  Follow-up subdural hematoma. Neuro surgery notes from yesterday evening reviewed. EXAM: CT HEAD WITHOUT CONTRAST TECHNIQUE: Contiguous axial images were obtained from the base of the skull through the vertex without intravenous contrast. RADIATION DOSE REDUCTION: This exam was performed according to the departmental dose-optimization program which includes automated exposure control, adjustment of the mA and/or kV according to patient size and/or use of iterative reconstruction technique. COMPARISON:  Head CT from yesterday FINDINGS: Brain: Essentially holo hemispheric subdural hematoma on the left measuring up to 12-13 mm in thickness laterally. There has  been some stratification of hematocrit level since prior. Thickness of tentorial and falcine subdural hemorrhage has increased, but only measuring up to 3 mm in thickness. Diffuse subarachnoid hemorrhage along the left more than right cerebral convexities where there is bilateral frontal and left anterior temporal edema from contusion. Midline shift is unchanged at 6-7 mm. No complicating infarct. Vascular: No acute finding Skull: Nondepressed occipital bone fracture, known. Sinuses/Orbits: Negative IMPRESSION: 1. Mild increase in tentorial and falcine subdural hematoma, but only measuring up to 3 mm in thickness. 2. Lateral left subdural hemorrhage is similar in size and midline shift continues to measure 6-7 mm. 3. No change in frontotemporal contusions with overlying subarachnoid blood. Electronically Signed   By: Jorje Guild M.D.   On: 04/21/2021 06:08   DG Abd Portable 1V  Result Date: 04/20/2021 CLINICAL DATA:  Feeding tube placement. EXAM: PORTABLE ABDOMEN - 1 VIEW COMPARISON:  None. FINDINGS: The bowel gas pattern is normal. Distal tip of feeding tube is seen in expected position of distal stomach. No radio-opaque calculi or other significant radiographic abnormality are seen. IMPRESSION: Distal tip of feeding tube seen in expected position of distal stomach. Electronically Signed   By: Marijo Conception M.D.   On: 04/20/2021 12:36    Assessment/Plan: Left subdural hematoma, cerebral contusions: I have answered all the patient's daughters questions.  They have appropriately made her comfort care only/palliative care.  LOS: 3 days     Danielle [name] May 13, 2021, 8:49 AM     Patient ID: Danielle [name], female  DOB: 13-Apr-1932, 88 y.o.   MRN: [id number]

## 2021-05-09 NOTE — Progress Notes (Signed)
Pt on comfort care with family present at bedtime. Pt went asystole on the monitor, auscultated heart and lung sounds with Ciara, RN. TOD 2046. [facility] notified of cardiac TOD, not a donor candidate. ME called and notified. MD, Grandville Silos notified as well.

## 2021-05-09 NOTE — Progress Notes (Addendum)
Palliative Medicine Inpatient Follow Up Note  HPI:  Per intake H&P --> Danielle [name] is a 88 y.o. female with a hx of A. Flutter s/p cardioversion on Eliquis (last dose this morning), carotid artery disease/stenosis w/ hx of prior R endarterectomy, HTN, HLD, asthma, hypothyroidism and dementia per family who presented to the ED via EMS as a level 2 trauma after an unwitnessed fall with a left cerebral contusions, subdural hematoma, subarachnoid hemorrhage.   Palliative care has been asked to get involved in the setting of significant subdural bleeding to further address goals of care.  Today's Discussion (2021-05-11):  *Please note that this is a verbal dictation therefore any spelling or grammatical errors are due to the "[place]" system interpretation.  Chart reviewed inclusive of vital signs, progress notes, laboratory results, and diagnostic images.   (+)1 dose of hydromorphone IVP,  continues on around-the-clock glycopyrrolate.  I met with Danielle [name] at bedside this morning.  I was joined by her nurse Danielle [name].  Danielle [name] appeared to be in no distress and was quite comfortable.   Asked to place a gold DNR in the front of the chart which was done  Communication with the transitions of care team and [facility] for transfer to [facility] when a bed is available. _____________________________________________ Addendum:  Patient had a change of status this afternoon whereby her heart rate went from the 120s down to the 70s.  After conversation with patient's RN Danielle [name] there were concerns that she may not make it in transfer.  Family was called in to spend time with Danielle [name].  I met with patient's daughter, Danielle [name] in the lobby.  We reviewed her fears of patient discharging today and she shared she does not feel comfortable with it for the next 12 to 24 hours.  I shared that I understand this and we will hold the transfer to [facility] at this point in time.  Questions and concerns  were addressed appropriately.  Support was provided. _________________________________________________ Addendum #2  Met family at bedside after speaking to RN, Danielle [name] about patients change in condition. Upon evaluation noted to have Danielle [name]-stoke respirations. A great deal of secretion burden  Increased dilaudid and ativan frequency.  Requested patient stay on the unit as death seem immanent.   Additional Time: 60  Objective Assessment: Vital Signs Vitals:   05-11-21 0800 2021/05/11 1200  BP:    Pulse: (!) 123 (!) 122  Resp: (!) 24 (!) 24  Temp:    SpO2: 97% 96%    Intake/Output Summary (Last 24 hours) at 05-11-21 1259 Last data filed at 05-11-2021 1200 Gross per 24 hour  Intake 392.66 ml  Output 585 ml  Net -192.34 ml   Last Weight  Most recent update: 04/21/2021  5:10 AM    Weight  65.8 kg (145 lb 1 oz)            Gen: Elderly frail female HEENT: Moist mucous membranes  CV: Irregular rate and rhythm PULM: On 4 L nasal cannula ABD: soft/nontender EXT: No edema Neuro: Opens eyes does not follow commands  SUMMARY OF RECOMMENDATIONS   DNAR/DNI  Goal DNR placed on front of chart   Comfort care   Medication per [facility]   TOC for [facility] transfer   Unrestricted visitation   Ongoing Palliative support   Prognosis  < 2 weeks  MDM - High  Medical Decision Making:4 #/Complex Problems: 4  Data Reviewed:    4             Management: 4 (1-Straightforward, 2-Low, 3-Moderate, 4-High) ______________________________________________________________________________________ [place] Team Team Cell Phone: [phone] Please utilize secure chat with additional questions, if there is no response within 30 minutes please call the above phone number  Palliative Medicine Team providers are available by phone from 7am to 7pm daily and can be reached through the team cell phone.  Should this patient require  assistance outside of these hours, please call the patient's attending physician.

## 2021-05-09 NOTE — Progress Notes (Addendum)
Manufacturing engineer [facility])  Referral received for residential hospice at [facility].  Pt is approved and we have a bed for her today.  RN staff, please call report to [phone]. Room is assigned when report is called.  Please send completed DNR with patient.  Please call [name] once EMS arrives so they will have an ETA on when she will get to [facility].  Venia Carbon BSN, RN [facility] Liaison  ** will need DC summary please**  **addendum, 724 pm, notified by nursing that pt had a change in her HR and respiratory pattern. Decision was made to not transfer her to [facility].

## 2021-05-09 NOTE — TOC Progression Note (Signed)
Transition of Care [facility]) - Progression Note    Patient Details  Name: Danielle [name] MRN: [id number] Date of Birth: Mar 30, 1932  Transition of Care [facility]) CM/SW Contact  Bartholomew Crews, RN Phone Number: [phone] May 08, 2021, 8:03 AM  Clinical Narrative:     Notified by palliative of referral to [facility]. TOC to follow and assist with transition.   Expected Discharge Plan: [place] Barriers to Discharge: Hospice Bed not available  Expected Discharge Plan and Services Expected Discharge Plan: [place]                                               Social Determinants of Health (SDOH) Interventions    Readmission Risk Interventions No flowsheet data found.

## 2021-05-09 NOTE — Progress Notes (Signed)
Patient ID: Danielle [name], female   DOB: [birth year], 88 y.o.   MRN: [id number] Follow up - Trauma Critical Care   Patient Details:    Danielle [name] is an 88 y.o. female.  Lines/tubes : External Urinary Catheter (Active)  Collection Container Dedicated Suction Canister 04/24/2021 2000  Suction (Verified suction is between 40-80 mmHg) Yes 05/03/2021 1700  Securement Method Mesh underwear 04/16/2021 2000  Site Assessment Clean;Intact 04/29/2021 2000  Intervention Female External Urinary Catheter Replaced 05/02/2021 1700  Output (mL) 450 mL 05/04/2021 1800    Microbiology/Sepsis markers: Results for orders placed or performed during the hospital encounter of 04/26/2021  Resp Panel by RT-PCR (Flu A&B, Covid) Nasopharyngeal Swab     Status: None   Collection Time: 05/04/2021  2:10 PM   Specimen: Nasopharyngeal Swab; Nasopharyngeal(NP) swabs in vial transport medium  Result Value Ref Range Status   SARS Coronavirus 2 by RT PCR NEGATIVE NEGATIVE Final    Comment: (NOTE) SARS-CoV-2 target nucleic acids are NOT DETECTED.  The SARS-CoV-2 RNA is generally detectable in upper respiratory specimens during the acute phase of infection. The lowest concentration of SARS-CoV-2 viral copies this assay can detect is 138 copies/mL. A negative result does not preclude SARS-Cov-2 infection and should not be used as the sole basis for treatment or other patient management decisions. A negative result may occur with  improper specimen collection/handling, submission of specimen other than nasopharyngeal swab, presence of viral mutation(s) within the areas targeted by this assay, and inadequate number of viral copies(<138 copies/mL). A negative result must be combined with clinical observations, patient history, and epidemiological information. The expected result is Negative.  Fact Sheet for Patients:  EntrepreneurPulse.com.au  Fact Sheet for Healthcare Providers:   IncredibleEmployment.be  This test is no t yet approved or cleared by the [place] FDA and  has been authorized for detection and/or diagnosis of SARS-CoV-2 by FDA under an Emergency Use Authorization (EUA). This EUA will remain  in effect (meaning this test can be used) for the duration of the COVID-19 declaration under Section 564(b)(1) of the Act, 21 U.S.C.section 360bbb-3(b)(1), unless the authorization is terminated  or revoked sooner.       Influenza A by PCR NEGATIVE NEGATIVE Final   Influenza B by PCR NEGATIVE NEGATIVE Final    Comment: (NOTE) The Xpert Xpress SARS-CoV-2/FLU/RSV plus assay is intended as an aid in the diagnosis of influenza from Nasopharyngeal swab specimens and should not be used as a sole basis for treatment. Nasal washings and aspirates are unacceptable for Xpert Xpress SARS-CoV-2/FLU/RSV testing.  Fact Sheet for Patients: EntrepreneurPulse.com.au  Fact Sheet for Healthcare Providers: IncredibleEmployment.be  This test is not yet approved or cleared by the [place] FDA and has been authorized for detection and/or diagnosis of SARS-CoV-2 by FDA under an Emergency Use Authorization (EUA). This EUA will remain in effect (meaning this test can be used) for the duration of the COVID-19 declaration under Section 564(b)(1) of the Act, 21 U.S.C. section 360bbb-3(b)(1), unless the authorization is terminated or revoked.  Performed at [facility], [place] [address], [place], [place] [address]   MRSA Next Gen by PCR, Nasal     Status: None   Collection Time: 04/13/2021  4:58 PM   Specimen: Nasal Mucosa; Nasal Swab  Result Value Ref Range Status   MRSA by PCR Next Gen NOT DETECTED NOT DETECTED Final    Comment: (NOTE) The GeneXpert MRSA Assay (FDA approved for NASAL specimens only), is one component of  a comprehensive MRSA colonization surveillance program. It is not intended to diagnose  MRSA infection nor to guide or monitor treatment for MRSA infections. Test performance is not FDA approved in patients less than 8 years old. Performed at [facility], [place] [address], [place], [place] [address]     Anti-infectives:  Anti-infectives (From admission, onward)    None       Best Practice/Protocols:  VTE Prophylaxis: Mechanical .  Consults: Treatment Team:  Vallarie Mare, MD   Subjective:    Overnight Issues:   Objective:  Vital signs for last 24 hours: Temp:  [97.5 F (36.4 C)-97.8 F (36.6 C)] 97.8 F (36.6 C) (02/12 0730) Pulse Rate:  [106-124] 124 (02/12 0730) Resp:  [18-37] 20 (02/12 0730) BP: (130-147)/(81-92) 147/90 (02/12 0730) SpO2:  [93 %-99 %] 97 % (02/12 0730)  Hemodynamic parameters for last 24 hours:    Intake/Output from previous day: 02/11 0701 - 02/12 0700 In: 615.2 [I.V.:615.2] Out: 520 [Urine:520]  Intake/Output this shift: No intake/output data recorded.  Vent settings for last 24 hours:    Physical Exam:  General: no respiratory distress Neuro: opened eyes to stim, localizes LUE, not F/C HEENT/Neck: no JVD Resp: clear to auscultation bilaterally CVS: IRR and HR 70s GI: soft, nontender, BS WNL, no r/g Extremities: no edema  Results for orders placed or performed during the hospital encounter of 04/27/2021 (from the past 24 hour(s))  Basic metabolic panel     Status: Abnormal   Collection Time: 04/21/21 10:25 AM  Result Value Ref Range   Sodium 138 135 - 145 mmol/L   Potassium 4.8 3.5 - 5.1 mmol/L   Chloride 113 (H) 98 - 111 mmol/L   CO2 16 (L) 22 - 32 mmol/L   Glucose, Bld 131 (H) 70 - 99 mg/dL   BUN 33 (H) 8 - 23 mg/dL   Creatinine, Ser 1.13 (H) 0.44 - 1.00 mg/dL   Calcium 8.2 (L) 8.9 - 10.3 mg/dL   GFR, Estimated 47 (L) >60 mL/min   Anion gap 9 5 - 15  Glucose, capillary     Status: Abnormal   Collection Time: 04/21/21 11:43 AM  Result Value Ref Range   Glucose-Capillary 115 (H) 70 - 99 mg/dL   Glucose, capillary     Status: Abnormal   Collection Time: 04/21/21  3:34 PM  Result Value Ref Range   Glucose-Capillary 101 (H) 70 - 99 mg/dL  Magnesium     Status: None   Collection Time: 04/21/21  4:26 PM  Result Value Ref Range   Magnesium 1.8 1.7 - 2.4 mg/dL  Phosphorus     Status: None   Collection Time: 04/21/21  4:26 PM  Result Value Ref Range   Phosphorus 2.6 2.5 - 4.6 mg/dL    Assessment & Plan: Present on Admission:  Subdural hematoma    LOS: 3 days   Additional comments:I reviewed the patient's new clinical lab test results. And F/U CT H Unwitnessed Fall TBI/SAH/SDH  Occipital Skull fracture with scalp hematoma -  Hx A. Flutter on Eliquis  Hx CAD Hx asthma Hx hypothyroidism Hx HTN Hx HLD Elevated LFTs  Comfort care being guided by palliative care team.  Appreciate their assistance.    Felicie Morn, MD  Trauma & General Surgery Use [email or web address] to contact on call provider  May 12, 2021  *Care during the described time interval was provided by me. I have reviewed this patient's available data, including medical history, events of note, physical examination  and test results as part of my evaluation.

## 2021-05-09 NOTE — Discharge Summary (Signed)
Patient ID: Danielle [name] [id number] 88 y.o. 10/27/1932  04/13/2021  Discharge date and time: 28-Apr-2021  Admitting Physician: Danielle [name]  Discharge Physician: Danielle [name]  Admission Diagnoses: Subdural hematoma [S06.5XAA] SAH (subarachnoid hemorrhage) (HCC) [I60.9] SDH (subdural hematoma) [S06.5XAA] Fall, initial encounter [id number]] Altered mental status, unspecified altered mental status type [R41.82] Closed fracture of skull, unspecified bone, initial encounter ([place]) [S02.91XA] Patient Active Problem List   Diagnosis Date Noted   Protein-calorie malnutrition, severe 04/20/2021   Subdural hematoma 05/08/2021   Unspecified atrial flutter (HCC)    Acute encephalopathy 11/18/2013   Primary osteoarthritis of right shoulder 11/16/2013   Osteoarthritis of left shoulder 07/20/2013   Primary localized osteoarthrosis, shoulder region 07/20/2013   Palpitations 06/06/2013   Aortic valve sclerosis 06/06/2013   Mitral annular calcification 06/06/2013   Carotid stenosis 10/26/2012   Hyperlipidemia 10/26/2012   Hypothyroidism 10/26/2012   HTN (hypertension) 10/26/2012   S/P cataract surgery 10/26/2012     Discharge Diagnoses: Head trauma Patient Active Problem List   Diagnosis Date Noted   Protein-calorie malnutrition, severe 04/20/2021   Subdural hematoma 05/02/2021   Unspecified atrial flutter (HCC)    Acute encephalopathy 11/18/2013   Primary osteoarthritis of right shoulder 11/16/2013   Osteoarthritis of left shoulder 07/20/2013   Primary localized osteoarthrosis, shoulder region 07/20/2013   Palpitations 06/06/2013   Aortic valve sclerosis 06/06/2013   Mitral annular calcification 06/06/2013   Carotid stenosis 10/26/2012   Hyperlipidemia 10/26/2012   Hypothyroidism 10/26/2012   HTN (hypertension) 10/26/2012   S/P cataract surgery 10/26/2012    Operations:   Admission Condition: poor  Discharged Condition: poor  Indication for Admission: Head  trauma  Hospital Course: Danielle [name] slipped and fell at home going down 1 step try to catch herself on a grill that had wheels and hit her head.  She suffered a head bleed.  She had significant neurologic status changes related to this head bleed.  Her anticoagulation was reversed.  The case was reviewed by the trauma team as well as the neurosurgery team and the palliative care team.  Unfortunately with her baseline dementia, and the severity of the head bleed while on blood thinners, it did not appear that this would be a recoverable injury.  After discussions with the family decision was made to proceed with comfort care.  She is being discharged to hospice.  Consults: Neurosurgery and palliative care  Significant Diagnostic Studies: CT imaging, lab work  Treatments: Reversal of anticoagulation, supportive care in the ICU  Disposition: Residential hospice at [facility]  Patient Instructions:  Allergies as of 04-28-2021   No Known Allergies      Medication List     STOP taking these medications    acetaminophen 500 MG tablet Commonly known as: TYLENOL   ALPRAZolam 0.25 MG tablet Commonly known as: XANAX   cetirizine 10 MG tablet Commonly known as: ZYRTEC   Eliquis 5 MG Tabs tablet Generic drug: apixaban   escitalopram 10 MG tablet Commonly known as: LEXAPRO   fluticasone 50 MCG/ACT nasal spray Commonly known as: FLONASE   hydroxypropyl methylcellulose / hypromellose 2.5 % ophthalmic solution Commonly known as: ISOPTO TEARS / GONIOVISC   KRILL OIL PO   levothyroxine 100 MCG tablet Commonly known as: SYNTHROID   multivitamin with minerals Tabs tablet   nebivolol 10 MG tablet Commonly known as: BYSTOLIC   oseltamivir 75 MG capsule Commonly known as: TAMIFLU   rosuvastatin 5 MG tablet Commonly known as: CRESTOR   VITAMIN D PO  Signed: Nickola Major Caela [name] General, Bariatric, & Minimally Invasive Surgery [facility], [place]   2021/05/13,  12:44 PM

## 2021-05-09 DEATH — deceased
